# Patient Record
Sex: Male | Born: 1950
Health system: Southern US, Community
[De-identification: ages and names within clinical notes are randomized; demographics above are authoritative.]

## PROBLEM LIST (undated history)

## (undated) DIAGNOSIS — B182 Chronic viral hepatitis C: Secondary | ICD-10-CM

## (undated) DIAGNOSIS — I6529 Occlusion and stenosis of unspecified carotid artery: Secondary | ICD-10-CM

## (undated) HISTORY — DX: Chronic viral hepatitis C: B18.2

## (undated) HISTORY — DX: Occlusion and stenosis of unspecified carotid artery: I65.29

---

## 2002-12-09 ENCOUNTER — Encounter: Payer: Self-pay | Admitting: Emergency Medicine

## 2002-12-09 ENCOUNTER — Emergency Department (HOSPITAL_COMMUNITY): Admission: EM | Admit: 2002-12-09 | Discharge: 2002-12-09 | Payer: Self-pay | Admitting: Emergency Medicine

## 2004-01-12 ENCOUNTER — Emergency Department (HOSPITAL_COMMUNITY): Admission: EM | Admit: 2004-01-12 | Discharge: 2004-01-12 | Payer: Self-pay

## 2006-05-21 ENCOUNTER — Ambulatory Visit (HOSPITAL_COMMUNITY): Admission: RE | Admit: 2006-05-21 | Discharge: 2006-05-21 | Payer: Self-pay | Admitting: Gastroenterology

## 2006-08-19 ENCOUNTER — Encounter: Admission: RE | Admit: 2006-08-19 | Discharge: 2006-08-19 | Payer: Self-pay | Admitting: Occupational Medicine

## 2008-12-13 ENCOUNTER — Ambulatory Visit: Payer: Self-pay | Admitting: Gastroenterology

## 2009-01-09 ENCOUNTER — Encounter (INDEPENDENT_AMBULATORY_CARE_PROVIDER_SITE_OTHER): Payer: Self-pay | Admitting: Interventional Radiology

## 2009-01-09 ENCOUNTER — Ambulatory Visit (HOSPITAL_COMMUNITY): Admission: RE | Admit: 2009-01-09 | Discharge: 2009-01-09 | Payer: Self-pay | Admitting: Gastroenterology

## 2009-01-29 ENCOUNTER — Ambulatory Visit: Payer: Self-pay | Admitting: Gastroenterology

## 2010-01-06 ENCOUNTER — Encounter: Admission: RE | Admit: 2010-01-06 | Discharge: 2010-01-06 | Payer: Self-pay | Admitting: Emergency Medicine

## 2010-04-17 ENCOUNTER — Ambulatory Visit (HOSPITAL_COMMUNITY): Admission: RE | Admit: 2010-04-17 | Discharge: 2010-04-17 | Payer: Self-pay | Admitting: Gastroenterology

## 2010-08-06 ENCOUNTER — Emergency Department (HOSPITAL_COMMUNITY): Admission: EM | Admit: 2010-08-06 | Discharge: 2010-08-06 | Payer: Self-pay | Admitting: Family Medicine

## 2010-10-29 ENCOUNTER — Ambulatory Visit (HOSPITAL_COMMUNITY)
Admission: RE | Admit: 2010-10-29 | Discharge: 2010-10-29 | Payer: Self-pay | Source: Home / Self Care | Attending: Emergency Medicine | Admitting: Emergency Medicine

## 2010-10-29 ENCOUNTER — Ambulatory Visit (HOSPITAL_COMMUNITY): Admission: RE | Admit: 2010-10-29 | Payer: Self-pay | Source: Home / Self Care | Admitting: Emergency Medicine

## 2010-11-23 ENCOUNTER — Encounter: Payer: Self-pay | Admitting: Emergency Medicine

## 2010-11-26 ENCOUNTER — Ambulatory Visit
Admission: RE | Admit: 2010-11-26 | Discharge: 2010-11-26 | Payer: Self-pay | Source: Home / Self Care | Attending: Occupational Medicine | Admitting: Occupational Medicine

## 2011-02-12 LAB — CBC
Platelets: 162 10*3/uL (ref 150–400)
RDW: 13.9 % (ref 11.5–15.5)
WBC: 6.6 10*3/uL (ref 4.0–10.5)

## 2011-02-12 LAB — PROTIME-INR
INR: 1 (ref 0.00–1.49)
Prothrombin Time: 13.4 seconds (ref 11.6–15.2)

## 2011-02-12 LAB — APTT: aPTT: 24 seconds (ref 24–37)

## 2011-10-28 ENCOUNTER — Ambulatory Visit (INDEPENDENT_AMBULATORY_CARE_PROVIDER_SITE_OTHER): Payer: Commercial Managed Care - PPO

## 2011-10-28 DIAGNOSIS — IMO0002 Reserved for concepts with insufficient information to code with codable children: Secondary | ICD-10-CM

## 2011-10-28 DIAGNOSIS — Z202 Contact with and (suspected) exposure to infections with a predominantly sexual mode of transmission: Secondary | ICD-10-CM

## 2011-10-28 DIAGNOSIS — E119 Type 2 diabetes mellitus without complications: Secondary | ICD-10-CM

## 2012-01-14 ENCOUNTER — Ambulatory Visit: Payer: Self-pay | Admitting: Gastroenterology

## 2012-01-21 ENCOUNTER — Telehealth: Payer: Self-pay

## 2012-01-21 NOTE — Telephone Encounter (Signed)
Pt states he will call and try to schedule appt with dr Cleta Alberts.

## 2012-01-21 NOTE — Telephone Encounter (Signed)
DR. Cleta Alberts   Pt wants to know why he is going to a specialist on [facility].  (reassurance)  [phone]

## 2012-01-30 ENCOUNTER — Ambulatory Visit (INDEPENDENT_AMBULATORY_CARE_PROVIDER_SITE_OTHER): Payer: 59 | Admitting: Emergency Medicine

## 2012-01-30 ENCOUNTER — Ambulatory Visit: Payer: Commercial Managed Care - PPO

## 2012-01-30 VITALS — BP 114/68 | HR 72 | Temp 97.7°F | Resp 16 | Ht 70.5 in | Wt 190.2 lb

## 2012-01-30 DIAGNOSIS — R634 Abnormal weight loss: Secondary | ICD-10-CM

## 2012-01-30 DIAGNOSIS — F17200 Nicotine dependence, unspecified, uncomplicated: Secondary | ICD-10-CM

## 2012-01-30 DIAGNOSIS — B1920 Unspecified viral hepatitis C without hepatic coma: Secondary | ICD-10-CM

## 2012-01-30 LAB — COMPREHENSIVE METABOLIC PANEL
ALT: 223 U/L — ABNORMAL HIGH (ref 0–53)
Albumin: 4.3 g/dL (ref 3.5–5.2)
Alkaline Phosphatase: 120 U/L — ABNORMAL HIGH (ref 39–117)
CO2: 27 mEq/L (ref 19–32)
Glucose, Bld: 288 mg/dL — ABNORMAL HIGH (ref 70–99)
Potassium: 4.2 mEq/L (ref 3.5–5.3)
Sodium: 133 mEq/L — ABNORMAL LOW (ref 135–145)
Total Bilirubin: 1.1 mg/dL (ref 0.3–1.2)
Total Protein: 7.7 g/dL (ref 6.0–8.3)

## 2012-01-30 LAB — POCT CBC
Granulocyte percent: 58 %G (ref 37–80)
Hemoglobin: 15.3 g/dL (ref 14.1–18.1)
MCH, POC: 28.2 pg (ref 27–31.2)
MCV: 86.3 fL (ref 80–97)
RBC: 5.43 M/uL (ref 4.69–6.13)
WBC: 8.4 10*3/uL (ref 4.6–10.2)

## 2012-01-30 LAB — PSA: PSA: 0.33 ng/mL (ref <=4.00)

## 2012-01-30 LAB — POCT GLYCOSYLATED HEMOGLOBIN (HGB A1C): Hemoglobin A1C: 10.7

## 2012-01-30 LAB — HIV ANTIBODY (ROUTINE TESTING W REFLEX): HIV: NONREACTIVE

## 2012-01-30 LAB — GLUCOSE, POCT (MANUAL RESULT ENTRY): POC Glucose: 293

## 2012-01-30 NOTE — Progress Notes (Signed)
  Subjective:    Patient ID: Jesse Wilcox, male    DOB: Jan 17, 1951, 61 y.o.   MRN: [id number]  HPI Sukhraj presents for recheck. He has a rather complicated history in that he has hepatitis C and is a heavy smoker. He is had elevated alpha-fetoprotein in the past. He last had a CT abdomen pelvis approximately 1 year 3 months ago and did not show signs of hepatocellular carcinoma. He enters today with weight loss. His sugars have been running between 150 and 200. He is known to have persistent elevated LFTs associated with cirrhosis. Has appointment at the hep C clinic on April 4. His sister also has hep C and is coming up to go to the appointment with him. He continues to smoke a pack a day.    Review of Systems RS relates to the present illness. Many focused on his weight loss     Objective:   Physical Exam  Constitutional: He appears well-developed and well-nourished.  HENT:  Right Ear: External ear normal.  Left Ear: External ear normal.  Neck: No tracheal deviation present. No thyromegaly present.  Cardiovascular: Normal rate and regular rhythm.   Pulmonary/Chest: No respiratory distress. He has no wheezes. He has no rales. He exhibits no tenderness.  Abdominal: He exhibits no distension and no mass. There is no tenderness. There is no rebound and no guarding.    [facility] reading (PRIMARY) by  Dr.Amber Guthridge x-ray shows no acute disease.        Assessment & Plan:   We'll check routine labs. We'll check a chest x-ray. Will repeat alpha-fetoprotein to see if it continues to rise. He has an appointment to have C. clinic.

## 2012-02-01 ENCOUNTER — Encounter: Payer: Self-pay | Admitting: *Deleted

## 2012-02-04 ENCOUNTER — Ambulatory Visit: Payer: Self-pay | Admitting: Gastroenterology

## 2012-02-05 ENCOUNTER — Ambulatory Visit (INDEPENDENT_AMBULATORY_CARE_PROVIDER_SITE_OTHER): Payer: 59 | Admitting: Gastroenterology

## 2012-02-05 DIAGNOSIS — B182 Chronic viral hepatitis C: Secondary | ICD-10-CM

## 2012-02-05 DIAGNOSIS — K7460 Unspecified cirrhosis of liver: Secondary | ICD-10-CM

## 2012-02-08 NOTE — Telephone Encounter (Signed)
Pt states his blood sugar is still high and that Dr Cleta Alberts was going to put him on insulin toward the end of the month pt would like for Dr Cleta Alberts to let him know how much insulin should he be taking he will be going to [facility] [place] and they would be able to show him how to administer it to himself. Please contact pt @ [phone]

## 2012-02-08 NOTE — Telephone Encounter (Signed)
Called pt back and told him Dr Cleta Alberts will plan to see him on Wed evening and to cont his oral meds until then. Also advised pt to drink plenty of water and some exercise will help to keep BS lower until then. Pt agreed.

## 2012-02-08 NOTE — Telephone Encounter (Signed)
Please call patient having come in Wednesday and we will instruct him. on how to use insulin at that time.

## 2012-02-08 NOTE — Telephone Encounter (Signed)
Pt CB and reported his BSs are running in high 200s (285 fasting, 270 later in day a few days ago). Pt is experiencing Sxs, esp of irritability and is afraid he will lose his job if doesn't improve. Agreed w/pt not to wait until end of month for recheck. Pt will plan to RTC to see Dr Cleta Alberts Wed evening and will cont to take his oral DM meds until then. Dr Cleta Alberts, if you want pt to change any meds bf he RTC Wed, please advise and I will call him back.

## 2012-02-08 NOTE — Telephone Encounter (Signed)
LMOM for pt to CB. How high have his BSs been? Get more details on plan to start insulin. Don't see notes about it in OV notes.

## 2012-02-10 ENCOUNTER — Ambulatory Visit (INDEPENDENT_AMBULATORY_CARE_PROVIDER_SITE_OTHER): Payer: 59 | Admitting: Emergency Medicine

## 2012-02-10 VITALS — BP 154/77 | HR 82 | Temp 98.2°F | Resp 16 | Ht 70.5 in | Wt 192.8 lb

## 2012-02-10 DIAGNOSIS — O24919 Unspecified diabetes mellitus in pregnancy, unspecified trimester: Secondary | ICD-10-CM

## 2012-02-10 DIAGNOSIS — E119 Type 2 diabetes mellitus without complications: Secondary | ICD-10-CM | POA: Insufficient documentation

## 2012-02-10 DIAGNOSIS — F329 Major depressive disorder, single episode, unspecified: Secondary | ICD-10-CM

## 2012-02-10 LAB — GLUCOSE, POCT (MANUAL RESULT ENTRY): POC Glucose: 351

## 2012-02-10 NOTE — Patient Instructions (Addendum)
.   Her Glucophage. Please see me in 3 weeks to review your progress with use of Lantus. I will make a referral to a counselor for evaluation to rule out depression prior to starting antiviral therapy for your hepatitis C. we will start with Lantus insulin. We will start at 20 units at bedtime. We will increase her Lantus by 2 units every 48 hours until we see a morning sugar of between 120 and 140

## 2012-02-10 NOTE — Progress Notes (Signed)
  Subjective:    Patient ID: Jesse Wilcox, male    DOB: 03-30-1951, 61 y.o.   MRN: [id number]  HPI patient had a followup. He is known to have hepatitis C. We want to get him off his Glucophage. Apparently he saw the hep C doctor who wanted him to have evaluation for depression prior to starting antiviral therapy.    Review of Systems patient is here to talk about insulin therapy.     Objective:   Physical Exam physical exam repeated blood sugar check is 350        Assessment & Plan:  We'll initiate insulin therapy. We'll start on Lantus 20 units and increase by 2 units every to 3 days to get a morning sugar of 120-140.

## 2012-02-11 ENCOUNTER — Other Ambulatory Visit: Payer: Self-pay | Admitting: Emergency Medicine

## 2012-02-11 NOTE — Progress Notes (Addendum)
NAME:  Jesse Wilcox, Jesse Wilcox  MR#:  [id number]      DATE:  02/05/2012  DOB:  02-Jan-1951    cc: Consulting Physician: Jeani Hawking, MD, [facility], [place], [address], [address], [place], [place] [address], Fax [phone] Primary care physician: Same. Referring Physician: [name], MD, [facility], [address] [place], [place] [address], Fax [phone]    reason for referral: Genotype 1a, hepatitis C.   history: The patient is a 61 year old gentleman who I have been asked to see in consultation by Dr. Cleta Alberts regarding his genotype 1a hepatitis C.   It will be recalled that I first saw the patient on 12/03/2008, regarding his hepatitis C. He underwent a liver biopsy to assess the severity of disease, on 01/09/2009, which showed grade 3 stage II possibly stage III disease. I turned his care over to the nurse practitioner who last saw him on 01/29/2009, and declined to treat him. He was suppose to come back within the year and never followed up. Today, he cannot state whether, in fact, had an appointment to come back or not. There is no documentation in the records as to what occurred and the nurse practitioner has left years ago.   Currently, the patient has no symptoms referable to his history of hepatitis C nor are there symptoms to suggest cryoglobulin mediated or decompensated liver disease. The patient reports that he is concerned about recent weight loss and this may have also prompted the reevaluation of his liver disease. Looking through the records I received from [facility], his weight on 09/18/2011, was 204 pounds and today it is 193 pounds indicating an 11 pound weight loss over the last 5-1/2 months. Again, there are no symptoms of decompensated liver disease such as ascites or peripheral edema. He has no ongoing fevers. He has never had a variceal bleed nor has he been screened for varices that I can ascertain from looking  through his records. There are no symptoms of encephalopathy. In terms of his hepatocellular cancer [facility]) screening, he underwent a CT scan of the abdomen on 10/29/2010. This was done because he apparently became short of breath with gadolinium in the past. This showed a cirrhotic liver only. He has not had any liver imaging since. A chest x-ray was done 01/30/2012, because of the weight loss and his history of smoking, which was unremarkable. It should also be noted that he underwent presumably a screening colonoscopy by Dr. Jeani Hawking, on 05/21/2006. The results are not available in EPIC. The patient believes this was unremarkable.   With respect to risk factors for liver disease, he denies any alcohol use now over 15 years. He similarly denies any drug use over 15 years. He denies any history of blood transfusions. 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 He previously denied a family history of liver disease. Today, he had me talk to his sister, on the phone, who was previously treated for hepatitis C. He believes he was immunized against hepatitis B through his work, and thinks that Dr. Nickolas Madrid has vaccinated him against hepatitis A, as he was previously nave when last seen by me.   PAST MEDICAL HISTORY: Significant for hypertension. 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 He recalled in the past he was treated for  this. He has a history of type 2 diabetes. He recalls his last Hemoglobin A1c was 10% and a 3 day average on the monitor he brought with him today, was 218. He reports that when seen on 01/30/2012, by Dr. [name], there was discussion of starting him on insulin. He does not check his blood sugars daily. He only does this when he feels unwell. He denies any history of coronary artery disease or dyslipidemia.   PAST  SURGICAL HISTORY: Denies.  past psychiatric history: Denies.  current medications: Metformin 500 mg b.i.d., benazepril/hydrochlorothiazide 20/12.5 mg daily, amlodipine 10 mg daily, cyclobenzaprine 0.50 mg daily, aspirin 81 mg daily.  Allergies: denies.  habits: Smoking, 1 pack of cigarettes per day. Alcohol, as above.   family history: As above.   social history: Single. He has grown children. 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   He reported he wanted to bring his sister, who lives in [place], to his appointment today, but she had car problems preventing her from driving up today.  REVIEW OF SYSTEMS:  All 10 systems were reviewed today on the review of systems form, which was signed and placed in the chart. CES-D was 22.   PHYSICAL EXAMINATION:  Constitutional:  Stated age without stigmata of liver disease. No peripheral wasting.  Vital signs: Height 70 inches, weight 193 pounds. Blood pressure 148/94, pulse 79, temperature 97.6 Fahrenheit.  Ears, Nose, Mouth and Throat:  Unremarkable oropharynx.  No thyromegaly or neck masses.  Chest:  Resonant to percussion.  Clear to auscultation.  Cardiovascular:  Heart sounds normal S1, S2 without murmurs or rubs.  There is no peripheral edema.  Abdomen:  Normal bowel sounds.  No masses or tenderness.  I could not appreciate a liver edge or spleen tip.  I could not appreciate any hernias.  Lymphatics:  No cervical or inguinal lymphadenopathy.  Central Nervous System:  No asterixis or focal neurologic findings.  Dermatologic:  Anicteric without palmar erythema or spider angiomata.  Eyes:  Anicteric sclerae.  Pupils are equal and reactive to light.  laboratories: The patient reported that labs were done on 01/30/2012, but I did not receive these. The last labs I have were from 04/12/2011, at which time his AST was 123, ALT 194, ALP 101, total bilirubin 1.0,  albumin 4.0, globulins 2.8, creatinine 0.85. Of note, his RPR on 04/12/2011, was negative. AFP was 22.8, which was down from 32.9 in December 2011 and his HIV was negative.   assessment: The patient is a 61 year old gentleman with a history of genotype 1a hepatitis C with a previous biopsy on 01/09/2009, showing grad 3, stage II-III, but most likely stage III fibrosis with more recently radiographic imaging on 10/29/2010, suggesting cirrhosis. He is nave to treatment.   In terms of cirrhosis care, there is no indication of ascites or edema to treat. There are no symptoms of encephalopathy to treat. There is no indication of a need for endoscopy to screen for varices. He reports previous hepatitis B vaccination through work and hepatitis A vaccination through his primary physician. In terms of hepatocellular cancer screening, he is overdue having last undergone screening on 10/29/2010.   In terms of his candidacy for treatment of his hepatitis C, with a Hemoglobin A1c of 10% recently and his blood sugars as shown on his monitor today, he is a poor candidate for treatment of his hepatitis C. In fact, through my conversation  with him, it appears the patient has little understanding of the complexity of hepatitis C management. It would appear the he would be best helped by his sister who could not attend his appointment today. I would need to see improvement in his blood sugar control and I would like to have his family members with him at his appointment before I could discuss starting him on therapy, which would likely involve pegylated interferon and ribavirin and a protease inhibitor. The complexity and side effects of treatment can be very significant and it may be even best to wait for simeprevir before starting him on therapy. By the time his diabetes gets under better control, simeprevir may be available anyway.  In terms of his weight loss, he needs to be screened for hepatocellular cancer. In  addition, cirrhosis can cause weight loss in some patients, particularly peripheral muscle loss. This would have to be matched with significant decline in synthetic function by lab testing. If neither the CT scan that I am going to order, shows any significant disease, nor do his lab tests show any significant synthetic dysfunction, than his weight loss is not attributable to his liver disease.   In my discussion today with the patient, and then in repetition with his sister over the phone, I discussed his previous genotyping and biopsy results. I discussed the significance of them. I discussed screening him for hepatocellular cancer with a CT scan, because he did not tolerate the gadolinium in the past. We discussed obtaining lab work today. He mentioned that his labs were done recently, but I have not received any of these. We discussed treatment with pegylated interferon and ribavirin and a protease inhibitor, as well. I have explained to him that with his poorly controlled diabetes, he is not a candidate for this. I also explained that he would probably need stress testing before commencement on therapy.   plan:  1. CT scan with contrast ordered. 2. I have ordered AFP, CBC, CMP, INR, TSH, and ANA.  3. If Dr. Cleta Alberts has any of the recent labs, I would appreciate receiving these by fax at [phone]. 4. No indication for endoscopy unless platelet count is 100,000.  5. No therapy until Hemoglobin A1c improves to perhaps less than 7%. 6. I would ask that, as the diabetes gets under better control, that Dr. Cleta Alberts arrange for stress testing in anticipation of hepatitis C treatment induced anemia that could provoke any underlying ischemic heart disease that may not be known about, but he is at risk for because of his diabetes and smoking history.  7. He reports that he has received hepatitis A and B vaccinations in the past.          8. He is to return in approximately 2-4 month's time in follow up  to review the lab testing and his progress in terms of bringing his diabetes under control and to bring his sister back to discuss treatment again.               Brooke Dare, MD   ADDENDUM: None of the labs that I ordered were done.  I understand that he did not want to have them done because Dr. Cleta Alberts had just done labs.  I would ask, therefore, that any labs from the last month be faxed to [phone].  As I am signing this, I note his 02/10/12 clinic note from Dr. Cleta Alberts, in which it is stated that the patient said I wanted him evaluated  for depression prior to therapy.  This is clearly not the case and speaks to Mr Ebers's lack of understanding of his hepatitis C and contributes to my doubts about his candidacy for treatment.   ADDENDUM  02/17/12 Labs from 01/30/12 from Dr Cleta Alberts received an placed in the paper chart.   [phone]  D:  Fri Apr 05 20:10:56 2013 ; T:  Sun Apr 07 10:02:19 2013  Job #:  [id number]

## 2012-02-12 ENCOUNTER — Other Ambulatory Visit: Payer: Self-pay | Admitting: Emergency Medicine

## 2012-02-12 ENCOUNTER — Telehealth: Payer: Self-pay | Admitting: Emergency Medicine

## 2012-02-12 DIAGNOSIS — B182 Chronic viral hepatitis C: Secondary | ICD-10-CM

## 2012-02-12 NOTE — Telephone Encounter (Signed)
Please call patient. I received the notes from Dr. Jacqualine Mau his hep C Dr. He wants him referred to cardiology and not to a psychologist. I have made the referral to the cardiology office and we'll cancel his appointment with the psychologist.

## 2012-02-15 NOTE — Telephone Encounter (Signed)
LMOM of Dr Ellis Parents message and that we will be making the cardiologist referral for him.

## 2012-03-01 ENCOUNTER — Ambulatory Visit (INDEPENDENT_AMBULATORY_CARE_PROVIDER_SITE_OTHER): Payer: 59 | Admitting: Emergency Medicine

## 2012-03-01 VITALS — BP 116/77 | HR 86 | Temp 98.0°F | Resp 16 | Ht 71.0 in | Wt 194.6 lb

## 2012-03-01 DIAGNOSIS — B1920 Unspecified viral hepatitis C without hepatic coma: Secondary | ICD-10-CM

## 2012-03-01 DIAGNOSIS — E119 Type 2 diabetes mellitus without complications: Secondary | ICD-10-CM

## 2012-03-01 MED ORDER — INSULIN GLARGINE 100 UNIT/ML ~~LOC~~ SOLN: 34.0000 [IU] | Freq: Every day | SUBCUTANEOUS | Status: DC

## 2012-03-01 NOTE — Progress Notes (Signed)
  Subjective:    Patient ID: Jesse Wilcox, male    DOB: 07-Apr-1951, 61 y.o.   MRN: [id number]  HPI Patient presents for followup of his diabetes. He was switched off his oral medicines on Lantus currently using 34 units a day. Normal running between the 100 in the 120. He denies any low spells. He is totally off his metformin and Glucotrol. He has not heard about his appointment with cardiologist. Seen Dr. Eliseo Squires at the hep C clinic and they are awaiting clearance from the cardiologist prior to him starting therapy for his hep C   Review of Systems     Objective:   Physical Exam HEENT exam unremarkable chest was clear heart regular rate no murmurs.   Results for orders placed in visit on 03/01/12  GLUCOSE, POCT (MANUAL RESULT ENTRY)      Component Value Range   POC Glucose 112         Assessment & Plan:      Patient is doing great. I went ahead and made the appointment with the cardiologist while the patient was here so there would be no misunderstanding of his appointment time. Once this is done he is cleared to start his therapy for hep C

## 2012-03-11 ENCOUNTER — Other Ambulatory Visit: Payer: Self-pay | Admitting: Family Medicine

## 2012-03-11 MED ORDER — ALCOHOL PREPS PADS: 1.0000 | MEDICATED_PAD | Freq: Three times a day (TID) | Status: DC | PRN

## 2012-03-11 MED ORDER — GNP SUPER THIN LANCETS 30G MISC: 30.0000 g | Freq: Three times a day (TID) | Status: DC | PRN

## 2012-04-07 ENCOUNTER — Ambulatory Visit: Payer: 59 | Admitting: Gastroenterology

## 2012-04-12 ENCOUNTER — Encounter (HOSPITAL_COMMUNITY): Payer: Self-pay | Admitting: Pharmacy Technician

## 2012-04-14 ENCOUNTER — Ambulatory Visit (INDEPENDENT_AMBULATORY_CARE_PROVIDER_SITE_OTHER): Payer: 59 | Admitting: Emergency Medicine

## 2012-04-14 VITALS — BP 131/75 | HR 66 | Temp 97.4°F | Resp 16 | Ht 71.0 in | Wt 197.0 lb

## 2012-04-14 DIAGNOSIS — E119 Type 2 diabetes mellitus without complications: Secondary | ICD-10-CM

## 2012-04-14 DIAGNOSIS — B182 Chronic viral hepatitis C: Secondary | ICD-10-CM

## 2012-04-14 DIAGNOSIS — K739 Chronic hepatitis, unspecified: Secondary | ICD-10-CM

## 2012-04-14 DIAGNOSIS — I6529 Occlusion and stenosis of unspecified carotid artery: Secondary | ICD-10-CM

## 2012-04-14 NOTE — Progress Notes (Signed)
  Subjective:    Patient ID: Jesse Wilcox, male    DOB: [birth year], 61 y.o.   MRN: [id number]  HPI patient is to followup on his diabetes. His sugars have been running in the 100 to 140 range and he feels much better on his insulin injections. He is seeing Dr. Jacinto Halim she had had ultrasound which was suspicious for carotid stenosis and is scheduled for an arteriogram His hep C treatment has been placed on hold until his carotid studies are done to   Review of Systems     Objective:   Physical Exam I could not hear any carotid bruits on exam. His cardiac exam is unremarkable. His glucose checked at home this morning was 1 to        Assessment & Plan:  Discussed the need to proceed with carotid studies to rule out stenosis

## 2012-04-15 ENCOUNTER — Encounter (HOSPITAL_COMMUNITY)
Admission: RE | Disposition: A | Discharge status: Home / Self Care | Payer: Self-pay | Source: Ambulatory Visit | Attending: Cardiology

## 2012-04-15 ENCOUNTER — Ambulatory Visit (HOSPITAL_COMMUNITY)
Admission: RE | Admit: 2012-04-15 | Discharge: 2012-04-15 | Disposition: A | Discharge status: Home / Self Care | Payer: 59 | Source: Ambulatory Visit | Attending: Cardiology | Admitting: Cardiology

## 2012-04-15 DIAGNOSIS — R0989 Other specified symptoms and signs involving the circulatory and respiratory systems: Secondary | ICD-10-CM | POA: Insufficient documentation

## 2012-04-15 DIAGNOSIS — I6529 Occlusion and stenosis of unspecified carotid artery: Secondary | ICD-10-CM | POA: Diagnosis present

## 2012-04-15 DIAGNOSIS — I708 Atherosclerosis of other arteries: Secondary | ICD-10-CM | POA: Insufficient documentation

## 2012-04-15 DIAGNOSIS — F17200 Nicotine dependence, unspecified, uncomplicated: Secondary | ICD-10-CM | POA: Insufficient documentation

## 2012-04-15 DIAGNOSIS — E119 Type 2 diabetes mellitus without complications: Secondary | ICD-10-CM | POA: Insufficient documentation

## 2012-04-15 DIAGNOSIS — B1920 Unspecified viral hepatitis C without hepatic coma: Secondary | ICD-10-CM | POA: Insufficient documentation

## 2012-04-15 HISTORY — PX: CAROTID ANGIOGRAM: SHX5504

## 2012-04-15 HISTORY — PX: ARCH AORTOGRAM: SHX5501

## 2012-04-15 HISTORY — PX: OTHER SURGICAL HISTORY: SHX169

## 2012-04-15 LAB — POCT I-STAT, CHEM 8
Chloride: 108 mEq/L (ref 96–112)
HCT: 48 % (ref 39.0–52.0)
Potassium: 3.9 mEq/L (ref 3.5–5.1)

## 2012-04-15 LAB — GLUCOSE, CAPILLARY: Glucose-Capillary: 105 mg/dL — ABNORMAL HIGH (ref 70–99)

## 2012-04-15 SURGERY — CAROTID ANGIOGRAM
Anesthesia: LOCAL

## 2012-04-15 MED ORDER — SODIUM CHLORIDE 0.9 % IV SOLN
INTRAVENOUS | Status: DC
  Administered 2012-04-15: 06:00:00 via INTRAVENOUS

## 2012-04-15 MED ORDER — LIDOCAINE HCL (PF) 1 % IJ SOLN
INTRAMUSCULAR | Status: AC
  Filled 2012-04-15: qty 30

## 2012-04-15 MED ORDER — FAMOTIDINE IN NACL 20-0.9 MG/50ML-% IV SOLN: 40.0000 mg | Freq: Once | INTRAVENOUS | Status: DC

## 2012-04-15 MED ORDER — HEPARIN (PORCINE) IN NACL 2-0.9 UNIT/ML-% IJ SOLN
INTRAMUSCULAR | Status: AC
  Filled 2012-04-15: qty 1000

## 2012-04-15 MED ORDER — SODIUM CHLORIDE 0.9 % IV SOLN: 1.0000 mL/kg/h | INTRAVENOUS | Status: DC

## 2012-04-15 MED ORDER — FAMOTIDINE IN NACL 20-0.9 MG/50ML-% IV SOLN
INTRAVENOUS | Status: AC
  Administered 2012-04-15: 40 mg via INTRAVENOUS
  Filled 2012-04-15: qty 50

## 2012-04-15 NOTE — Progress Notes (Signed)
Pt walked in hallway without difficulty. Right groin dressing remains CDI.

## 2012-04-15 NOTE — Interval H&P Note (Signed)
History and Physical Interval Note:  04/15/2012 7:27 AM  Jesse Wilcox  has presented today for surgery, with the diagnosis of Stenosis  The various methods of treatment have been discussed with the patient and family. After consideration of risks, benefits and other options for treatment, the patient has consented to  Procedure(s) (LRB): CAROTID ANGIOGRAM (N/A) as a surgical intervention .  The patients' history has been reviewed, patient examined, no change in status, stable for surgery.  I have reviewed the patients' chart and labs.  Questions were answered to the patient's satisfaction.     Pamella Pert

## 2012-04-15 NOTE — H&P (Signed)
  Please see paper chart  

## 2012-04-15 NOTE — CV Procedure (Signed)
Procedure performed: Aortic arch angiogram Selective right and left carotid angiogram both extracranial and intracranial  Indication:  Mr. Jesse Wilcox is a 60 year old Caucasian male with history of chronic hepatitis C, diabetes mellitus, history tobacco use disorder who on physical examination had very prominent carotid bruit. Outpatient Doppler evaluation had revealed suspicion for a high-grade stenosis of the left internal carotid. He is brought to the peripheral angiography suite to evaluate and confirm high-grade stenoses.  Right femoral arteriogram and closure of right femoral arterial access with Perclose.   Aortic arch angiogram: Type I Arch with normal origin of the extracranial vasculature. The subclavian arteries on both sides and the vertebral arteries on both sides were widely patent in the proximal segment. There was a suspicion for high-grade stenosis of the left internal carotid artery.  Selective right carotid arteriogram: There was mild disease evident in the right internal carotid artery but there was no high-grade stenoses.  Intracranial vessels: Grossly normal without evidence of any aneurysmal dilatation or occlusion. The intracerebral portion of the carotid angiogram will be read by radiology.  Left carotid arteriogram: There is mild disease in the left common carotid. Immediately after the origin of the left internal carotid artery there was a high-grade 90% stenoses.  Intracranial vessels from left internal carotid artery: They're grossly normal without any evidence of aneurysmal dilatation or occlusion or stenoses. The intracerebral portion will be read by radiology.  Right external iliac artery and right common femoral artery: There was a focal 50-60% stenosis of the right external iliac artery. This was not fully analyzed. The angiogram was performed as a part of trying to close the arterial access with Perclose. If claudication is present clinically, further  evaluation may be indicated.  Technical procedure: Under sterile precautions using a 5 French right femoral arterial access a 5 French pigtail catheter was advanced into the arch of the aorta over a Versacore wire. Arch aortogram was performed in the LAO projection. The catheter then poorer body over the same wire. A 5 [place] JB 1 catheter was then advanced into the arch of the aorta over the same wire and selective right and left carotid arteries were engaged and angiography were performed in the standard fashion and standard views. The cath was then pulled out of the body over the same wire. Right femoral arteriogram was performed through the arterial access sheath and the access was attempted to close with Perclose, however do to failure of Perclose manual pressure was held for 10 minutes. Patient be discharged home today after 4 hours of bedrest. Patient can return to work on Monday. I have discussed the angiogram with Dr. Fabienne Bruns and he'll followup with him for elective carotid endarterectomy for a high-grade stenoses in asymptomatic individuals.

## 2012-04-15 NOTE — Discharge Instructions (Signed)
Patient can return to work on 04/18/2012 without any limitations. No driving today and tomorrow and essentially had to remain in bed most day-to-day.    Groin Site Care Refer to this sheet in the next few weeks. These instructions provide you with information on caring for yourself after your procedure. Your caregiver may also give you more specific instructions. Your treatment has been planned according to current medical practices, but problems sometimes occur. Call your caregiver if you have any problems or questions after your procedure. HOME CARE INSTRUCTIONS  You may shower 24 hours after the procedure. Remove the bandage (dressing) and gently wash the site with plain soap and water. Gently pat the site dry.   Do not apply powder or lotion to the site.   Do not sit in a bathtub, swimming pool, or whirlpool for 5 to 7 days.   No bending, squatting, or lifting anything over 10 pounds (4.5 kg) as directed by your caregiver.   Inspect the site at least twice daily.   Do not drive home if you are discharged the same day of the procedure. Have someone else drive you.   You may drive 24 hours after the procedure unless otherwise instructed by your caregiver.  What to expect:  Any bruising will usually fade within 1 to 2 weeks.   Blood that collects in the tissue (hematoma) may be painful to the touch. It should usually decrease in size and tenderness within 1 to 2 weeks.  SEEK IMMEDIATE MEDICAL CARE IF:  You have unusual pain at the groin site or down the affected leg.   You have redness, warmth, swelling, or pain at the groin site.   You have drainage (other than a small amount of blood on the dressing).   You have chills.   You have a fever or persistent symptoms for more than 72 hours.   You have a fever and your symptoms suddenly get worse.   Your leg becomes pale, cool, tingly, or numb.   You have heavy bleeding from the site. Hold pressure on the site.  Document  Released: 11/21/2010 Document Revised: 10/08/2011 Document Reviewed: 11/21/2010 [facility] Patient Information 2012 [employer], [place].

## 2012-04-18 ENCOUNTER — Telehealth: Payer: Self-pay | Admitting: Vascular Surgery

## 2012-04-18 ENCOUNTER — Encounter: Payer: Self-pay | Admitting: Vascular Surgery

## 2012-04-18 NOTE — Telephone Encounter (Signed)
Staff message on MRN [id number] also had instructions for Mr Weekley:  "Also please schedule appt for Patsy Lager for carotid eval, had angio by Dr Jacinto Halim today. Does not need duplex, just office visit to consider CEA vs stent next Thursday." Fabienne Bruns Friday April 15, 2012 1:54pm  Left message for patient to call and confirm appt. dpm

## 2012-04-21 ENCOUNTER — Encounter: Payer: 59 | Admitting: Vascular Surgery

## 2012-04-27 ENCOUNTER — Encounter: Payer: Self-pay | Admitting: Vascular Surgery

## 2012-04-28 ENCOUNTER — Encounter: Payer: Self-pay | Admitting: Vascular Surgery

## 2012-04-28 ENCOUNTER — Ambulatory Visit (INDEPENDENT_AMBULATORY_CARE_PROVIDER_SITE_OTHER): Payer: 59 | Admitting: Vascular Surgery

## 2012-04-28 VITALS — BP 137/84 | HR 78 | Temp 97.7°F | Ht 71.0 in | Wt 201.0 lb

## 2012-04-28 DIAGNOSIS — I6529 Occlusion and stenosis of unspecified carotid artery: Secondary | ICD-10-CM

## 2012-04-28 NOTE — Progress Notes (Signed)
History of Present Illness:  Patient is a 61 y.o. year old male who presents for evaluation of carotid stenosis.  He was found by his primary care physician to have an asymptomatic left carotid bruit The patient denies symptoms of TIA, amaurosis, or stroke.  The patient is currently on aspirin antiplatelet therapy.  He is referred by Dr. Yates Decamp after a recent carotid angiogram Other medical problems include hepatitis C, diabetes, hypertension, tobacco abuse.  These are currently stable and followed by Dr. Cleta Alberts. He currently smokes half-pack cigarettes per day.    Past Medical History  Diagnosis Date  . Chronic hepatitis C   . Diabetes mellitus   . Carotid artery occlusion     Past Surgical History  Procedure Date  . Aortic arch angiogram 04-15-12    By Dr. Jacinto Halim     Social History History  Substance Use Topics  . Smoking status: Current Everyday Smoker -- 0.5 packs/day for 42 years    Types: Cigarettes  . Smokeless tobacco: Never Used  . Alcohol Use: No   He works at [employer]  Family History Family History  Problem Relation Age of Onset  . Diabetes Mother   . Heart disease Mother   . Hypertension Mother   . Heart attack Mother   . Diabetes Father   . Heart disease Father   . Hypertension Father   . Diabetes Sister   . Hyperlipidemia Sister   . Heart disease Sister   . Heart attack Sister   . Peripheral vascular disease Sister   . Hyperlipidemia Brother   . Hypertension Brother     Allergies  Allergies  Allergen Reactions  . Iohexol      Code: SOB, Desc: Immed post contrast inj of Multihance, pt had difficulty breathing after several minutes pt felt better., Onset Date: [id number]      Current Outpatient Prescriptions  Medication Sig Dispense Refill  . amLODipine (NORVASC) 10 MG tablet Take 10 mg by mouth daily.      Marland Kitchen aspirin EC 81 MG tablet Take 81 mg by mouth daily.      . benazepril-hydrochlorthiazide (LOTENSIN HCT) 20-12.5 MG per tablet Take 1  tablet by mouth daily.      . cyclobenzaprine (FLEXERIL) 5 MG tablet Take 2.5 mg by mouth at bedtime.       . insulin glargine (LANTUS) 100 UNIT/ML injection Inject 34 Units into the skin at bedtime.        ROS:   General:  No weight loss, Fever, chills  HEENT: No recent headaches, no nasal bleeding, no visual changes, no sore throat  Neurologic: No dizziness, blackouts, seizures. No recent symptoms of stroke or mini- stroke. No recent episodes of slurred speech, or temporary blindness.  Cardiac: No recent episodes of chest pain/pressure, no shortness of breath at rest.  No shortness of breath with exertion.  Denies history of atrial fibrillation or irregular heartbeat  Vascular: No history of rest pain in feet.  No history of claudication.  No history of non-healing ulcer, No history of DVT   Pulmonary: No home oxygen, no productive cough, no hemoptysis,  No asthma or wheezing  Musculoskeletal:  [ ]  Arthritis, [ ]  Low back pain,  [ ]  Joint pain  Hematologic:No history of hypercoagulable state.  No history of easy bleeding.  No history of anemia  Gastrointestinal: No hematochezia or melena,  No gastroesophageal reflux, no trouble swallowing  Urinary: [ ]  chronic Kidney disease, [ ]  on HD - [ ]   MWF or [ ]  TTHS, [ ]  Burning with urination, [ ]  Frequent urination, [ ]  Difficulty urinating;   Skin: No rashes  Psychological: No history of anxiety,  No history of depression   Physical Examination  Filed Vitals:   04/28/12 0841 04/28/12 0849  BP: 142/79 137/84  Pulse: 84 78  Temp: 97.7 F (36.5 C)   TempSrc: Oral   Height: 5\' 11"  (1.803 m)   Weight: 201 lb (91.173 kg)   SpO2: 100%     Body mass index is 28.03 kg/(m^2).  General:  Alert and oriented, no acute distress HEENT: Normal Neck: Soft left bruit  no  JVD Pulmonary: Clear to auscultation bilaterally Cardiac: Regular Rate and Rhythm without murmur Gastrointestinal: Soft, non-tender, non-distended, no mass Skin: No  rash Extremity Pulses:  2+ radial, brachial, femoral pulses bilaterally Musculoskeletal: No deformity or edema  Neurologic: Upper and lower extremity motor 5/5 and symmetric  DATA: I reviewed the recent carotid angiogram by Dr. Newt Lukes. This shows a 80% stenosis of the left internal carotid artery. There is a 50% shelflike plaque in the right internal carotid artery. It is a type I arch. The carotid bifurcation is fairly high and the distal end of the plaque extends underneath the angle of the mandible   ASSESSMENT:   I had a lengthy discussion with the patient and his sister today. I discussed the risks and benefits of medical management versus carotid endarterectomy versus carotid stenting. I told the patient that a carotid endarterectomy could be performed but there would be some risk of swallowing difficulties afterwards due to the high extent of the lesion. However I did inform him that the risk of stroke was lower than with carotid stenting. On the other hand I discussed with him that the risk of carotid stenting is well overall and the risk of cranial nerve injury would be less. He has opted for carotid stenting at this point.  PLAN:  Left carotid stent, our office will call him later today with his operative date.  He was given a prescription for Plavix today. We will maintain him on Plavix and aspirin. I discussed with him that we will leave him on Plavix for at least 6 weeks after the procedure. He may continue indefinitely.   Fabienne Bruns, MD [facility] [place] Office: [phone] Pager: [phone]

## 2012-05-04 ENCOUNTER — Ambulatory Visit: Payer: 59

## 2012-05-04 ENCOUNTER — Ambulatory Visit (INDEPENDENT_AMBULATORY_CARE_PROVIDER_SITE_OTHER): Payer: 59 | Admitting: Emergency Medicine

## 2012-05-04 ENCOUNTER — Other Ambulatory Visit: Payer: Self-pay

## 2012-05-04 VITALS — BP 144/82 | HR 75 | Temp 98.1°F | Resp 16 | Ht 70.5 in | Wt 200.0 lb

## 2012-05-04 DIAGNOSIS — M79673 Pain in unspecified foot: Secondary | ICD-10-CM

## 2012-05-04 DIAGNOSIS — M79609 Pain in unspecified limb: Secondary | ICD-10-CM

## 2012-05-04 NOTE — Patient Instructions (Addendum)
Metatarsal Stress Fracture  When too much stress is put on the foot, as in running and jumping sports, the center shaft of the bones of the forefoot is very susceptible to stress fractures (break in bone). This is because of repetitive stress on the bone. This injury is more common if osteoporosis is present or if inadequate running shoes are used. Rapid increase in running distances are often the cause. Running distances should be gradually increased to avoid this problem. Shoes should be used which adequately cushion the foot. Shoes should absorb the shocks of the activity.    DIAGNOSIS    Usually the diagnosis is made by history. The foot progressively becomes sorer with activities. X-rays may be negative (show no break) within the first 2 to 3 weeks of the beginning of pain. A later X-ray may show signs of healing bone (callus formation). A bone scan or MRI will usually make the diagnosis earlier.  TREATMENT AND HOME CARE INSTRUCTIONS   Treatment may or may not include a cast, removable fracture boot, or walking shoe. Casts are used for short periods of time to prevent muscle atrophy (muscle wasting).    Activities should be stopped until further advised by your caregiver.    Wear shoes with adequate shock absorbing abilities.    Alternative exercise may be undertaken while waiting for healing. These may include bicycling and swimming, or as your caregiver suggests.   If you do not have a cast or splint:   You may walk on your injured foot as tolerated or advised.    Do not put any weight on your injured foot for as long as directed by your caregiver. Slowly increase the amount of time you walk on the foot as the pain allows or as advised.    Use crutches until you can bear weight without pain. A gradual increase in weight bearing may help.    Apply ice to the injury for 15 to 20 minutes each hour while awake for the first 2 days. Put the ice in a plastic bag and place a towel between the bag of ice and  your skin.    Only take over-the-counter or prescription medicines for pain, discomfort, or fever as directed by your caregiver.   SEEK IMMEDIATE MEDICAL CARE IF:     Pain is becoming worse rather than better, or if pain is uncontrolled with medications.    You have increased swelling or redness in the foot.   MAKE SURE YOU:     Understand these instructions.    Will watch your condition.    Will get help right away if you are not doing well or get worse.   Document Released: 10/16/2000 Document Revised: 10/08/2011 Document Reviewed: 08/14/2008  [employer] Patient Information 2012 [employer].

## 2012-05-04 NOTE — Progress Notes (Signed)
  Subjective:    Patient ID: Jesse Wilcox, male    DOB: [birth year], 61 y.o.   MRN: [id number]  HPI patient enters with a one-day history of severe pain and discomfort in his right foot he has severe pain at the end of his third and fourth metatarsals of his right foot. Not had any numbness or weakness    Review of Systems     Objective:   Physical Exam there is exquisite tenderness over the distal third and fourth metatarsals at the joint space. There is some swelling in this area   Results for orders placed in visit on 05/04/12  GLUCOSE, POCT (MANUAL RESULT ENTRY)      Component Value Range   POC Glucose 135 (*) 70 - 99 mg/dl  [facility] reading (PRIMARY) by  Dr.Allysha Tryon is a radiolucent line in the distant fourth metatarsal no definite abnormality       Assessment & Plan:  I suspect the patient has a stress fracture. Beginning comfortable shoes versus a postop shoe. Troller 135. Her upcoming left carotid stenting July 26 Dr. Darrick Penna.

## 2012-05-17 ENCOUNTER — Other Ambulatory Visit: Payer: Self-pay | Admitting: Family Medicine

## 2012-05-17 MED ORDER — INSULIN PEN NEEDLE 31G X 8 MM MISC: Status: DC

## 2012-05-27 ENCOUNTER — Ambulatory Visit (HOSPITAL_COMMUNITY): Admission: RE | Admit: 2012-05-27 | Payer: 59 | Source: Ambulatory Visit | Admitting: Vascular Surgery

## 2012-05-27 ENCOUNTER — Encounter (HOSPITAL_COMMUNITY): Admission: RE | Payer: Self-pay | Source: Ambulatory Visit

## 2012-05-27 SURGERY — CAROTID STENT INSERTION
Anesthesia: LOCAL

## 2012-06-29 ENCOUNTER — Encounter: Payer: Self-pay | Admitting: Vascular Surgery

## 2012-06-30 ENCOUNTER — Encounter: Payer: Self-pay | Admitting: Vascular Surgery

## 2012-06-30 ENCOUNTER — Ambulatory Visit: Payer: 59 | Admitting: Vascular Surgery

## 2012-06-30 VITALS — BP 153/86 | HR 72 | Resp 16 | Ht 70.5 in | Wt 201.0 lb

## 2012-06-30 DIAGNOSIS — I6529 Occlusion and stenosis of unspecified carotid artery: Secondary | ICD-10-CM

## 2012-07-21 ENCOUNTER — Ambulatory Visit (INDEPENDENT_AMBULATORY_CARE_PROVIDER_SITE_OTHER): Payer: 59 | Admitting: Emergency Medicine

## 2012-07-21 VITALS — BP 140/80 | HR 71 | Temp 98.1°F | Resp 16 | Ht 71.0 in | Wt 200.6 lb

## 2012-07-21 DIAGNOSIS — N481 Balanitis: Secondary | ICD-10-CM

## 2012-07-21 DIAGNOSIS — N476 Balanoposthitis: Secondary | ICD-10-CM

## 2012-07-21 DIAGNOSIS — Z23 Encounter for immunization: Secondary | ICD-10-CM

## 2012-07-21 DIAGNOSIS — M62838 Other muscle spasm: Secondary | ICD-10-CM

## 2012-07-21 DIAGNOSIS — E119 Type 2 diabetes mellitus without complications: Secondary | ICD-10-CM

## 2012-07-21 DIAGNOSIS — I10 Essential (primary) hypertension: Secondary | ICD-10-CM

## 2012-07-21 DIAGNOSIS — E139 Other specified diabetes mellitus without complications: Secondary | ICD-10-CM

## 2012-07-21 DIAGNOSIS — B029 Zoster without complications: Secondary | ICD-10-CM

## 2012-07-21 LAB — POCT CBC
Granulocyte percent: 56 %G (ref 37–80)
HCT, POC: 51.3 % (ref 43.5–53.7)
Hemoglobin: 16 g/dL (ref 14.1–18.1)
Lymph, poc: 2.4 (ref 0.6–3.4)
MCHC: 31.2 g/dL — AB (ref 31.8–35.4)
MPV: 11 fL (ref 0–99.8)
POC Granulocyte: 3.7 (ref 2–6.9)
POC MID %: 7.4 %M (ref 0–12)
RBC: 5.67 M/uL (ref 4.69–6.13)

## 2012-07-21 LAB — GLUCOSE, POCT (MANUAL RESULT ENTRY): POC Glucose: 117 mg/dl — AB (ref 70–99)

## 2012-07-21 LAB — COMPREHENSIVE METABOLIC PANEL
ALT: 201 U/L — ABNORMAL HIGH (ref 0–53)
AST: 143 U/L — ABNORMAL HIGH (ref 0–37)
Albumin: 4 g/dL (ref 3.5–5.2)
Alkaline Phosphatase: 118 U/L — ABNORMAL HIGH (ref 39–117)
BUN: 13 mg/dL (ref 6–23)
Calcium: 9.2 mg/dL (ref 8.4–10.5)
Chloride: 102 mEq/L (ref 96–112)
Potassium: 4 mEq/L (ref 3.5–5.3)
Sodium: 135 mEq/L (ref 135–145)
Total Protein: 7.8 g/dL (ref 6.0–8.3)

## 2012-07-21 LAB — POCT GLYCOSYLATED HEMOGLOBIN (HGB A1C): Hemoglobin A1C: 7.6

## 2012-07-21 MED ORDER — AMLODIPINE BESYLATE 10 MG PO TABS: 10.0000 mg | ORAL_TABLET | Freq: Every day | ORAL | Status: DC

## 2012-07-21 MED ORDER — CYCLOBENZAPRINE HCL 5 MG PO TABS: 2.5000 mg | ORAL_TABLET | Freq: Every day | ORAL | Status: DC

## 2012-07-21 MED ORDER — VALACYCLOVIR HCL 1 G PO TABS: 1000.0000 mg | ORAL_TABLET | Freq: Three times a day (TID) | ORAL | Status: DC

## 2012-07-21 MED ORDER — BENAZEPRIL-HYDROCHLOROTHIAZIDE 20-12.5 MG PO TABS: 1.0000 | ORAL_TABLET | Freq: Every day | ORAL | Status: DC

## 2012-07-21 MED ORDER — KETOCONAZOLE 2 % EX CREA: TOPICAL_CREAM | Freq: Every day | CUTANEOUS | Status: DC

## 2012-07-21 MED ORDER — INSULIN GLARGINE 100 UNIT/ML ~~LOC~~ SOLN: 32.0000 [IU] | Freq: Every day | SUBCUTANEOUS | Status: DC

## 2012-07-21 MED ORDER — INSULIN PEN NEEDLE 31G X 8 MM MISC: Status: DC

## 2012-07-21 NOTE — Progress Notes (Signed)
  Subjective:    Patient ID: Jesse Wilcox, male    DOB: 1951/08/30, 61 y.o.   MRN: [id number]  HPI  Patient is here with possible shingles on the left side of abdomen and left side of chest and back.  Has not had his neck surgery, fighting to get it approved. Seeing  Dr. Jacinto Halim.  Patient has been to Dr. Darrick Penna. They are awaiting approval to have his carotid surgery. He is watching his diabetes. He continues on Lantus insulin. He also has had problems with a zing irritation around the base of the glans of his penis and the foreskin to      Review of Systems     Objective:   Physical Exam HEENT exam is unremarkable. I do not hear definite carotid bruit. Chest is clear heart regular rate no murmurs abdomen soft there are 2  isolated lesions left side of the abdomen that dot have the appearance of shingles. There is a weeping oozing lesion present over the base of the glans which extends down to the foreskin  Results for orders placed in visit on 07/21/12  POCT CBC      Component Value Range   WBC 6.6  4.6 - 10.2 K/uL   Lymph, poc 2.4  0.6 - 3.4   POC LYMPH PERCENT 36.6  10 - 50 %L   MID (cbc) 0.5  0 - 0.9   POC MID % 7.4  0 - 12 %M   POC Granulocyte 3.7  2 - 6.9   Granulocyte percent 56.0  37 - 80 %G   RBC 5.67  4.69 - 6.13 M/uL   Hemoglobin 16.0  14.1 - 18.1 g/dL   HCT, POC 14.7  82.9 - 53.7 %   MCV 90.5  80 - 97 fL   MCH, POC 28.2  27 - 31.2 pg   MCHC 31.2 (*) 31.8 - 35.4 g/dL   RDW, POC 56.2     Platelet Count, POC 151  142 - 424 K/uL   MPV 11.0  0 - 99.8 fL  GLUCOSE, POCT (MANUAL RESULT ENTRY)      Component Value Range   POC Glucose 117 (*) 70 - 99 mg/dl  POCT GLYCOSYLATED HEMOGLOBIN (HGB A1C)      Component Value Range   Hemoglobin A1C 7.6         Assessment & Plan:  I have given him a prescription for ketoconazole cream for his foreskin and base of the glans. We'll treat him with Valtrex to cover him for shingles. He will continue to stay in contact with Dr. fields  regarding the need for his carotid surgery.

## 2012-08-03 ENCOUNTER — Encounter: Payer: Self-pay | Admitting: *Deleted

## 2012-08-03 DIAGNOSIS — R0602 Shortness of breath: Secondary | ICD-10-CM

## 2012-08-09 ENCOUNTER — Encounter: Payer: Self-pay | Admitting: Gastroenterology

## 2012-08-09 ENCOUNTER — Telehealth: Payer: Self-pay | Admitting: Gastroenterology

## 2012-08-09 NOTE — Telephone Encounter (Signed)
Please call Mr. Reddish and let him know what he is supposed to do thank you

## 2012-08-09 NOTE — Telephone Encounter (Signed)
Labs from 07/22/12, received.   It should be noted that Mr. Jesse Wilcox was a no show to his hepatology appointment on 04/07/12.  The [facility] office in [place] is now closed.  I will ask that Mr. Jesse Wilcox be contacted by our [facility] at [facility] office to make arrangements for Mr. Jesse Wilcox to be seen in our new [place] office.    All future correspondence regarding Mr. Jesse Wilcox or other patients should be faxed to [phone].

## 2012-08-10 NOTE — Telephone Encounter (Signed)
Advised pt of message below from Dr Jacqualine Mau. Pt stated he thinks he missed his appt because he has been trying to work on his problem w/the blocked artery in neck. Pt wanted Dr Cleta Alberts to know that he is seeing his heart doctor next week and will find out when the procedure can be done to take out the blockage, which ins will cover. Dr Cleta Alberts, routing this message back only FYI.

## 2012-08-10 NOTE — Telephone Encounter (Signed)
LMOM to CB. 

## 2012-08-16 ENCOUNTER — Encounter (HOSPITAL_COMMUNITY): Payer: Self-pay

## 2012-08-17 ENCOUNTER — Encounter: Payer: Self-pay | Admitting: Vascular Surgery

## 2012-08-18 ENCOUNTER — Encounter: Payer: Self-pay | Admitting: Vascular Surgery

## 2012-08-18 ENCOUNTER — Ambulatory Visit (INDEPENDENT_AMBULATORY_CARE_PROVIDER_SITE_OTHER): Payer: 59 | Admitting: Vascular Surgery

## 2012-08-18 VITALS — BP 145/82 | HR 77 | Ht 70.5 in | Wt 202.0 lb

## 2012-08-18 DIAGNOSIS — I6529 Occlusion and stenosis of unspecified carotid artery: Secondary | ICD-10-CM

## 2012-08-18 NOTE — Progress Notes (Signed)
[facility] [place] HISTORY AND PHYSICAL   History of Present Illness:  Patient is a 61 y.o. year old male who presents for follow-up evaluation for carotid stenosis.  He is on Plavix/Aspirin for antiplatelet therapy.  He previously underwent carotid angiogram in June of this year. At that time he is found to have a high-grade left internal carotid artery stenosis. The lesion extended quite high. For this reason he has been considered for carotid stenting. After several appeals his insurance company has now approved left carotid stenting. His atherosclerotic risk factors remain diabetes, elevated cholesterol, hypertension, smoking, and coronary artery disease.  These are all currently stable and followed by his primary care physician.  He denies any new neurologic events including amaurosis, numbness, or weakness.  Past Medical History  Diagnosis Date  . Chronic hepatitis C   . Diabetes mellitus   . Carotid artery occlusion     Past Surgical History  Procedure Date  . Aortic arch angiogram 04-15-12    By Dr. Jacinto Halim    Review of Systems:  Neurologic: as above Cardiac:denies shortness of breath or chest pain Pulmonary: denies cough or wheeze  Social History History  Substance Use Topics  . Smoking status: Current Every Day Smoker -- 0.5 packs/day for 42 years    Types: Cigarettes  . Smokeless tobacco: Never Used  . Alcohol Use: No    Allergies  Allergies  Allergen Reactions  . Iohexol      Code: SOB, Desc: Immed post contrast inj of Multihance, pt had difficulty breathing after several minutes pt felt better., Onset Date: [id number]      Current Outpatient Prescriptions  Medication Sig Dispense Refill  . amLODipine (NORVASC) 10 MG tablet Take 1 tablet (10 mg total) by mouth daily.  90 tablet  3  . aspirin EC 81 MG tablet Take 81 mg by mouth daily.      . benazepril-hydrochlorthiazide (LOTENSIN HCT) 20-12.5 MG per tablet Take 1 tablet by mouth daily.  90  tablet  3  . cyclobenzaprine (FLEXERIL) 5 MG tablet Take 0.5 tablets (2.5 mg total) by mouth at bedtime.  45 tablet  3  . insulin glargine (LANTUS SOLOSTAR) 100 UNIT/ML injection Inject 32 Units into the skin at bedtime.  10 pen  PRN  . Insulin Pen Needle 31G X 8 MM MISC Use as directed  100 each  10  . ketoconazole (NIZORAL) 2 % cream Apply topically daily.  75 g  3  . Multiple Vitamin (MULTIVITAMIN WITH MINERALS) TABS Take 1 tablet by mouth daily.       Plavix 75 mg po qd  Physical Examination  Filed Vitals:   08/18/12 0905 08/18/12 0908  BP: 149/86 145/82  Pulse: 77   Height: 5' 10.5" (1.791 m)   Weight: 202 lb (91.627 kg)   SpO2: 100%     Body mass index is 28.57 kg/(m^2).  General:  Alert and oriented, no acute distress HEENT: Normal Neck: No bruit or JVD Pulmonary: Clear to auscultation bilaterally Cardiac: Regular Rate and Rhythm without murmur Neurologic: Upper and lower extremity motor 5/5 and symmetric Extremities: 1-2+ femoral pulses bilaterally  DATA: Angiogram from June was reviewed today. This shows a high-grade greater than 80% left internal carotid artery stenosis which extends up to and slightly past angle of mandible. Overall the patient has type I arch. There is no significant right internal carotid artery stenosis.   ASSESSMENT:   High-grade asymptomatic left internal carotid artery stenosis. The patient is  scheduled for a left carotid stent for stroke prophylaxis on October 22. He was given a note today to miss work until October 28.   PLAN:  Left carotid stent October 22. Risks benefits possible complications and procedure details were explained to the patient and his sister today. These are included but not limited to bleeding infection myocardial events stroke risk of less than 5%.  He was also emphasized to the patient the advantages of carotid stenting for cranial nerve protection.  Fabienne Bruns, MD [facility]  [place] Office: [phone] Pager: [phone]

## 2012-08-19 ENCOUNTER — Encounter (HOSPITAL_COMMUNITY): Admission: RE | Payer: Self-pay | Source: Ambulatory Visit

## 2012-08-19 ENCOUNTER — Ambulatory Visit (HOSPITAL_COMMUNITY): Admission: RE | Admit: 2012-08-19 | Payer: 59 | Source: Ambulatory Visit | Admitting: Vascular Surgery

## 2012-08-19 SURGERY — CAROTID STENT INSERTION
Anesthesia: LOCAL

## 2012-08-23 ENCOUNTER — Encounter (HOSPITAL_COMMUNITY)
Admission: RE | Disposition: A | Discharge status: Home / Self Care | Payer: Self-pay | Source: Ambulatory Visit | Attending: Vascular Surgery

## 2012-08-23 ENCOUNTER — Encounter (HOSPITAL_COMMUNITY): Payer: Self-pay | Admitting: *Deleted

## 2012-08-23 ENCOUNTER — Telehealth: Payer: Self-pay | Admitting: Vascular Surgery

## 2012-08-23 ENCOUNTER — Inpatient Hospital Stay (HOSPITAL_COMMUNITY)
Admission: RE | Admit: 2012-08-23 | Discharge: 2012-08-24 | DRG: 036 | Disposition: A | Discharge status: Home / Self Care | Payer: 59 | Source: Ambulatory Visit | Attending: Vascular Surgery | Admitting: Vascular Surgery

## 2012-08-23 DIAGNOSIS — I6529 Occlusion and stenosis of unspecified carotid artery: Principal | ICD-10-CM | POA: Diagnosis present

## 2012-08-23 DIAGNOSIS — M62838 Other muscle spasm: Secondary | ICD-10-CM

## 2012-08-23 DIAGNOSIS — I10 Essential (primary) hypertension: Secondary | ICD-10-CM

## 2012-08-23 DIAGNOSIS — B182 Chronic viral hepatitis C: Secondary | ICD-10-CM | POA: Diagnosis present

## 2012-08-23 DIAGNOSIS — N481 Balanitis: Secondary | ICD-10-CM

## 2012-08-23 DIAGNOSIS — Z794 Long term (current) use of insulin: Secondary | ICD-10-CM

## 2012-08-23 DIAGNOSIS — Z7902 Long term (current) use of antithrombotics/antiplatelets: Secondary | ICD-10-CM

## 2012-08-23 DIAGNOSIS — E139 Other specified diabetes mellitus without complications: Secondary | ICD-10-CM

## 2012-08-23 DIAGNOSIS — Z7982 Long term (current) use of aspirin: Secondary | ICD-10-CM

## 2012-08-23 DIAGNOSIS — Z79899 Other long term (current) drug therapy: Secondary | ICD-10-CM

## 2012-08-23 DIAGNOSIS — E119 Type 2 diabetes mellitus without complications: Secondary | ICD-10-CM | POA: Diagnosis present

## 2012-08-23 HISTORY — PX: CAROTID STENT INSERTION: SHX5505

## 2012-08-23 LAB — GLUCOSE, CAPILLARY
Glucose-Capillary: 145 mg/dL — ABNORMAL HIGH (ref 70–99)
Glucose-Capillary: 111 mg/dL — ABNORMAL HIGH (ref 70–99)
Glucose-Capillary: 116 mg/dL — ABNORMAL HIGH (ref 70–99)

## 2012-08-23 LAB — POCT I-STAT, CHEM 8
BUN: 18 mg/dL (ref 6–23)
Creatinine, Ser: 1 mg/dL (ref 0.50–1.35)
Hemoglobin: 16.3 g/dL (ref 13.0–17.0)
Potassium: 3.6 mEq/L (ref 3.5–5.1)
Sodium: 140 mEq/L (ref 135–145)
TCO2: 23 mmol/L (ref 0–100)

## 2012-08-23 SURGERY — CAROTID STENT INSERTION
Anesthesia: LOCAL

## 2012-08-23 MED ORDER — ASPIRIN 81 MG PO CHEW
81.0000 mg | CHEWABLE_TABLET | Freq: Once | ORAL | Status: AC
  Administered 2012-08-23: 81 mg via ORAL

## 2012-08-23 MED ORDER — ACETAMINOPHEN 650 MG RE SUPP: 325.0000 mg | RECTAL | Status: DC | PRN

## 2012-08-23 MED ORDER — LABETALOL HCL 5 MG/ML IV SOLN: 10.0000 mg | INTRAVENOUS | Status: DC | PRN

## 2012-08-23 MED ORDER — ALUM & MAG HYDROXIDE-SIMETH 200-200-20 MG/5ML PO SUSP: 15.0000 mL | ORAL | Status: DC | PRN

## 2012-08-23 MED ORDER — METOPROLOL TARTRATE 1 MG/ML IV SOLN: 2.0000 mg | INTRAVENOUS | Status: DC | PRN

## 2012-08-23 MED ORDER — BIVALIRUDIN 250 MG IV SOLR
INTRAVENOUS | Status: AC
  Filled 2012-08-23: qty 250

## 2012-08-23 MED ORDER — NOREPINEPHRINE BITARTRATE 1 MG/ML IJ SOLN
INTRAMUSCULAR | Status: AC
  Filled 2012-08-23: qty 4

## 2012-08-23 MED ORDER — BENAZEPRIL HCL 20 MG PO TABS
20.0000 mg | ORAL_TABLET | Freq: Every day | ORAL | Status: DC
  Filled 2012-08-23 (×2): qty 1

## 2012-08-23 MED ORDER — ADULT MULTIVITAMIN W/MINERALS CH
1.0000 | ORAL_TABLET | Freq: Every day | ORAL | Status: DC
  Administered 2012-08-23: 1 via ORAL
  Filled 2012-08-23 (×2): qty 1

## 2012-08-23 MED ORDER — BENAZEPRIL-HYDROCHLOROTHIAZIDE 20-12.5 MG PO TABS: 1.0000 | ORAL_TABLET | Freq: Every day | ORAL | Status: DC

## 2012-08-23 MED ORDER — ASPIRIN 81 MG PO CHEW
CHEWABLE_TABLET | ORAL | Status: AC
  Filled 2012-08-23: qty 1

## 2012-08-23 MED ORDER — SODIUM CHLORIDE 0.9 % IV SOLN
INTRAVENOUS | Status: DC
  Administered 2012-08-23: 1000 mL via INTRAVENOUS

## 2012-08-23 MED ORDER — SODIUM CHLORIDE 0.9 % IV SOLN: 500.0000 mL | Freq: Once | INTRAVENOUS | Status: AC | PRN

## 2012-08-23 MED ORDER — CYCLOBENZAPRINE HCL 5 MG PO TABS
2.5000 mg | ORAL_TABLET | Freq: Every day | ORAL | Status: DC
  Administered 2012-08-23: 2.5 mg via ORAL
  Filled 2012-08-23 (×2): qty 0.5

## 2012-08-23 MED ORDER — ASPIRIN EC 81 MG PO TBEC
81.0000 mg | DELAYED_RELEASE_TABLET | Freq: Every day | ORAL | Status: DC
  Administered 2012-08-23: 81 mg via ORAL
  Filled 2012-08-23 (×2): qty 1

## 2012-08-23 MED ORDER — MAGNESIUM SULFATE 40 MG/ML IJ SOLN
2.0000 g | Freq: Once | INTRAMUSCULAR | Status: AC | PRN
  Filled 2012-08-23: qty 50

## 2012-08-23 MED ORDER — INSULIN GLARGINE 100 UNIT/ML ~~LOC~~ SOLN
32.0000 [IU] | Freq: Every day | SUBCUTANEOUS | Status: DC
  Administered 2012-08-23: 32 [IU] via SUBCUTANEOUS

## 2012-08-23 MED ORDER — PNEUMOCOCCAL VAC POLYVALENT 25 MCG/0.5ML IJ INJ
0.5000 mL | INJECTION | INTRAMUSCULAR | Status: DC
  Filled 2012-08-23: qty 0.5

## 2012-08-23 MED ORDER — ACETAMINOPHEN 325 MG PO TABS: 325.0000 mg | ORAL_TABLET | ORAL | Status: DC | PRN

## 2012-08-23 MED ORDER — CLOPIDOGREL BISULFATE 75 MG PO TABS
75.0000 mg | ORAL_TABLET | Freq: Every day | ORAL | Status: DC
  Filled 2012-08-23: qty 1

## 2012-08-23 MED ORDER — AMLODIPINE BESYLATE 10 MG PO TABS
10.0000 mg | ORAL_TABLET | Freq: Every day | ORAL | Status: DC
  Filled 2012-08-23: qty 1

## 2012-08-23 MED ORDER — PHENOL 1.4 % MT LIQD: 1.0000 | OROMUCOSAL | Status: DC | PRN

## 2012-08-23 MED ORDER — KETOCONAZOLE 2 % EX CREA
TOPICAL_CREAM | Freq: Every day | CUTANEOUS | Status: DC
  Filled 2012-08-23: qty 15

## 2012-08-23 MED ORDER — ATROPINE SULFATE 1 MG/ML IJ SOLN
INTRAMUSCULAR | Status: AC
  Filled 2012-08-23: qty 1

## 2012-08-23 MED ORDER — INSULIN ASPART 100 UNIT/ML ~~LOC~~ SOLN
0.0000 [IU] | Freq: Three times a day (TID) | SUBCUTANEOUS | Status: DC
  Administered 2012-08-23: 3 [IU] via SUBCUTANEOUS
  Administered 2012-08-24: 2 [IU] via SUBCUTANEOUS

## 2012-08-23 MED ORDER — MORPHINE SULFATE 2 MG/ML IJ SOLN: 2.0000 mg | INTRAMUSCULAR | Status: DC | PRN

## 2012-08-23 MED ORDER — ONDANSETRON HCL 4 MG/2ML IJ SOLN: 4.0000 mg | Freq: Four times a day (QID) | INTRAMUSCULAR | Status: DC | PRN

## 2012-08-23 MED ORDER — HYDROCHLOROTHIAZIDE 12.5 MG PO CAPS
12.5000 mg | ORAL_CAPSULE | Freq: Every day | ORAL | Status: DC
  Filled 2012-08-23 (×2): qty 1

## 2012-08-23 MED ORDER — LIDOCAINE HCL (PF) 1 % IJ SOLN
INTRAMUSCULAR | Status: AC
  Filled 2012-08-23: qty 30

## 2012-08-23 MED ORDER — PANTOPRAZOLE SODIUM 40 MG PO TBEC
40.0000 mg | DELAYED_RELEASE_TABLET | Freq: Every day | ORAL | Status: DC
  Administered 2012-08-23: 40 mg via ORAL
  Filled 2012-08-23: qty 1

## 2012-08-23 MED ORDER — GUAIFENESIN-DM 100-10 MG/5ML PO SYRP: 15.0000 mL | ORAL_SOLUTION | ORAL | Status: DC | PRN

## 2012-08-23 MED ORDER — OXYCODONE HCL 5 MG PO TABS: 5.0000 mg | ORAL_TABLET | ORAL | Status: DC | PRN

## 2012-08-23 MED ORDER — DOPAMINE-DEXTROSE 3.2-5 MG/ML-% IV SOLN: 3.0000 ug/kg/min | INTRAVENOUS | Status: DC

## 2012-08-23 MED ORDER — POTASSIUM CHLORIDE CRYS ER 20 MEQ PO TBCR: 20.0000 meq | EXTENDED_RELEASE_TABLET | Freq: Once | ORAL | Status: AC | PRN

## 2012-08-23 MED ORDER — DOCUSATE SODIUM 100 MG PO CAPS: 100.0000 mg | ORAL_CAPSULE | Freq: Every day | ORAL | Status: DC

## 2012-08-23 MED ORDER — HYDRALAZINE HCL 20 MG/ML IJ SOLN: 10.0000 mg | INTRAMUSCULAR | Status: DC | PRN

## 2012-08-23 NOTE — Telephone Encounter (Addendum)
Message copied by Shari Prows on Tue Aug 23, 2012  9:52 AM ------      Message from: Sherren Kerns      Created: Tue Aug 23, 2012  9:02 AM       Left carotid stent      Berry's turn to bill            Needs follow up duplex in 2 weeks and see me            Fabienne Bruns, MD      [facility] [place]      Office: [phone]      Pager: [phone]        I scheduled an appointment for the above pt for 09/08/12 at 10:30am and to see CEF at 11:30am. I left a message for the pt and also mailed an appt letter.awt

## 2012-08-23 NOTE — Plan of Care (Signed)
Problem: Phase I Progression Outcomes Goal: Vascular site scale level 0 - I Vascular Site Scale Level 0: No bruising/bleeding/hematoma Level I (Mild): Bruising/Ecchymosis, minimal bleeding/ooozing, palpable hematoma < 3 cm Level II (Moderate): Bleeding not affecting hemodynamic parameters, pseudoaneurysm, palpable hematoma > 3 cm Outcome: Completed/Met Date Met:  08/23/12 Level 0

## 2012-08-23 NOTE — Progress Notes (Signed)
Larry sprinkle rn from cath lab came and removed sheath. Applied pressure x49min then applied pressure dressing.  will continue to monitor site. Beryle Quant

## 2012-08-23 NOTE — H&P (Signed)
The patient has been re-examined.  The patient's history and physical has been reviewed and is unchanged.  There is no change in the plan of care.  Kamel Haven, MD 

## 2012-08-23 NOTE — Op Note (Addendum)
Procedure: Left carotid angiogram, left carotid angioplasty and stenting   Preoperative diagnosis: High-grade left internal carotid artery stenosis  Postoperative diagnosis: Same   Anesthesia: Local  Co-surgeon: Nanetta Batty M.D.   Operative details: After obtaining informed consent, the patient was taken to the [facility] lab. The patient was placed in supine position the Angio table. Both groins were prepped and draped in usual sterile fashion. Local anesthesia was infiltrated over the right common femoral artery. An introducer needle was placed into the right common femoral artery and there was good backbleeding from this. A 0.035 Versacore wire was then threaded up the abdominal aorta under fluoroscopic guidance. A 6 French Cook shuttle sheath was then brought up on the operative field and advanced over the guidewire up into the ascending aorta. At this point an infusion of Angiomax was begun.A 5 French JB1 catheter was then advanced up into the aortic arch and a contrast angiogram was performed to determine the precise location of the left common carotid artery. Using the JB1 catheter I then advanced the tip of the catheter into the left common carotid artery. The Versacore wire was removed and exchanged for an 035 Amplatz wire. The Amplatz wire was gently advanced up into the common carotid artery. The JB1 catheter was then gently pulled back over the guidewire and the dilator placed back over the sheath and this advanced over the guidewire into the left common carotid artery.  ACT was also checked and was 344. At this point a cordis Angio guard 7 mm filter was brought on the operative field and this was advanced up to the level of the carotid stenosis.  With some manipulation the guidewire advanced through the stenosis with minimal difficulty. The filter was then advanced into the distal internal carotid artery at the level of the skull base. The filter was then deployed in this location. At this point a  repeat contrast angiogram was performed to again determined the level of the stenosis. A 3 x 2 angioplasty balloon was brought in operative field and advanced to the level of stenosis and quickly inflated and deflated to 10 atmospheres. The predilatation balloon was removed. A 9 x 40 Cordis precise stent was then brought in the operative field and advanced to the level of stenosis. Again using angiographic guidance, the stent was deployed so that the tip of the stent was past level of the high-grade stenosis. The stent was then deployed using a pinch pull technique. A postdilatation was then performed after the stent delivery device was removed. This was done with a 5 x 2 balloon inflated to 10 atmospheres for 5 seconds. A completion arteriogram was performed which showed that the residual stenosis was essentially 0 and the stent had good wall apposition. Completion intracranial views were also performed in AP and lateral projection to be interpreted later by the neuroradiologist. The patient tolerated procedure well and there were no complications. After the completion arteriogram had been performed, the sheath dilator was placed back in the sheath over a guidewire and these were then pulled down to the guidewire and the sheath exchanged for a 6 French short sheath in the right femoral artery. This was thoroughly flushed with heparinized saline and was left in place to be pulled in the holding area after the ACT is less than 175. Final ACT was 310. The patient tolerated the procedure well and there were no neurologic complications. The patient was transported to the holding area in stable condition.   Fabienne Bruns, MD  [facility] [place]  Office: [phone]  Pager: [phone]

## 2012-08-23 NOTE — Progress Notes (Signed)
Utilization Review Completed.  

## 2012-08-24 ENCOUNTER — Encounter: Payer: Self-pay | Admitting: *Deleted

## 2012-08-24 LAB — GLUCOSE, CAPILLARY

## 2012-08-24 MED ORDER — OXYCODONE HCL 5 MG PO TABS: 5.0000 mg | ORAL_TABLET | ORAL | Status: DC | PRN

## 2012-08-24 MED FILL — Dextrose Inj 5%: INTRAVENOUS | Qty: 50 | Status: AC

## 2012-08-24 NOTE — Discharge Summary (Signed)
[facility] Discharge Summary   Patient ID:  Jesse Wilcox MRN: [id number] DOB/AGE: [birth year] 61 y.o.  Admit date: 08/23/2012 Discharge date: 08/24/2012 Date of Surgery: 08/23/2012 Surgeon: Surgeon(s): Sherren Kerns, MD  Admission Diagnosis: Stenosis  Discharge Diagnoses:  Stenosis  Secondary Diagnoses: Past Medical History  Diagnosis Date  . Chronic hepatitis C   . Diabetes mellitus   . Carotid artery occlusion     Procedure(s): CAROTID STENT INSERTION  Discharged Condition: good  HPI:  History of Present Illness: Patient is a 61 y.o. year old male who presents for follow-up evaluation for carotid stenosis. He is on Plavix/Aspirin for antiplatelet therapy. He previously underwent carotid angiogram in June of this year. At that time he is found to have a high-grade left internal carotid artery stenosis. The lesion extended quite high. For this reason he has been considered for carotid stenting. After several appeals his insurance company has now approved left carotid stenting. His atherosclerotic risk factors remain diabetes, elevated cholesterol, hypertension, smoking, and coronary artery disease. These are all currently stable and followed by his primary care physician. He denies any new neurologic events including amaurosis, numbness, or weakness.   Hospital Course:  Jesse Wilcox is a 61 y.o. male is S/P Left Procedure(s): CAROTID STENT INSERTION Extubated: POD # 0 Post-op wounds clean, dry, intact or healing well right groin Pt. Ambulating, voiding and taking PO diet without difficulty. Pt pain controlled with PO pain meds. Labs as below Complications:none  Consults:     Significant Diagnostic Studies: CBC Lab Results  Component Value Date   WBC 6.6 07/21/2012   HGB 16.3 08/23/2012   HCT 48.0 08/23/2012   MCV 90.5 07/21/2012   PLT 162 01/09/2009    BMET    Component Value Date/Time   NA 140 08/23/2012 0619   K 3.6  08/23/2012 0619   CL 105 08/23/2012 0619   CO2 27 07/21/2012 1102   GLUCOSE 135* 08/23/2012 0619   BUN 18 08/23/2012 0619   CREATININE 1.00 08/23/2012 0619   CREATININE 0.78 07/21/2012 1102   CALCIUM 9.2 07/21/2012 1102   COAG Lab Results  Component Value Date   INR 1.0 01/09/2009     Disposition:  Discharge to :Home Discharge Orders    Future Appointments: Provider: Department: Dept Phone: Center:   09/08/2012 10:30 AM [facility] [phone] [facility]   09/08/2012 11:30 AM Sherren Kerns, MD [facility] [phone] [facility]     Future Orders Please Complete By Expires   Resume previous diet      Driving Restrictions      Comments:   No driving for 1 day   Lifting restrictions      Comments:   No heavy  Lifting for 6 weeks   Call MD for:  temperature >100.5      Call MD for:  redness, tenderness, or signs of infection (pain, swelling, bleeding, redness, odor or green/yellow discharge around incision site)      Call MD for:  severe or increased pain, loss or decreased feeling  in affected limb(s)      Increase activity slowly      Comments:   Walk with assistance use walker or cane as needed   May shower       Scheduling Instructions:   Remove dressing and shower tomorrow      [name], [name]  Home Medication Instructions [id number]   Printed on:08/24/12 6433  Medication Information  aspirin EC 81 MG tablet Take 81 mg by mouth daily.           Insulin Pen Needle 31G X 8 MM MISC Use as directed           benazepril-hydrochlorthiazide (LOTENSIN HCT) 20-12.5 MG per tablet Take 1 tablet by mouth daily.           amLODipine (NORVASC) 10 MG tablet Take 1 tablet (10 mg total) by mouth daily.           cyclobenzaprine (FLEXERIL) 5 MG tablet Take 0.5 tablets (2.5 mg total) by mouth at bedtime.           insulin glargine (LANTUS SOLOSTAR) 100 UNIT/ML injection Inject 32 Units into the skin at bedtime.           ketoconazole  (NIZORAL) 2 % cream Apply topically daily.           Multiple Vitamin (MULTIVITAMIN WITH MINERALS) TABS Take 1 tablet by mouth daily.           clopidogrel (PLAVIX) 75 MG tablet Take 75 mg by mouth daily.           oxyCODONE (OXY IR/ROXICODONE) 5 MG immediate release tablet Take 1-2 tablets (5-10 mg total) by mouth every 4 (four) hours as needed.            Verbal and written Discharge instructions given to the patient. Wound care per Discharge AVS Follow-up Information    Follow up with Fabienne Bruns E, MD. In 2 weeks. (sent)    Contact information:   [address] [place] [address] [phone]        Continue Plavix and Asprin until follow up visit in 2 weeks.  SignedMosetta Pigeon 08/24/2012, 7:33 AM

## 2012-08-24 NOTE — Progress Notes (Signed)
Pt voiding, eating and ambulating without difficulty.  Discharge instructions given to pt and sister.  Both verbalized understanding with all questions answered.  Pt discharged to home with sister.  Roselie Awkward, RN

## 2012-08-24 NOTE — Progress Notes (Signed)
[facility] [place]  Subjective  - POD #1  Carotid stent placement left.  History of Present Illness: Patient is a 61 y.o. year old male who presents for follow-up evaluation for carotid stenosis. He is on Plavix/Aspirin for antiplatelet therapy. He previously underwent carotid angiogram in June of this year. At that time he is found to have a high-grade left internal carotid artery stenosis. The lesion extended quite high. For this reason he has been considered for carotid stenting. After several appeals his insurance company has now approved left carotid stenting. His atherosclerotic risk factors remain diabetes, elevated cholesterol, hypertension, smoking, and coronary artery disease. These are all currently stable and followed by his primary care physician. He denies any new neurologic events including amaurosis, numbness, or weakness.  Objective 129/69 74 98.6 F (37 C) (Oral) 20 92%  Intake/Output Summary (Last 24 hours) at 08/24/12 0722 Last data filed at 08/24/12 0719  Gross per 24 hour  Intake    960 ml  Output   1550 ml  Net   -590 ml    N/V/M intact.  No tongue deviation, smile is symmetrical. Strength of all four extremities in 5/5.  Palpable DP/PT pulses 2+ equal bilaterally.  Assessment/Planning: POD #1  Carotid stent left.    D/C home  [facility] 08/24/2012 7:22 AM --  Laboratory Lab Results:  [facility] 08/23/12 0619  WBC --  HGB 16.3  HCT 48.0  PLT --   BMET  Basename 08/23/12 0619  NA 140  K 3.6  CL 105  CO2 --  GLUCOSE 135*  BUN 18  CREATININE 1.00  CALCIUM --    COAG Lab Results  Component Value Date   INR 1.0 01/09/2009   No results found for this basename: PTT    Antibiotics Anti-infectives    None

## 2012-08-29 ENCOUNTER — Other Ambulatory Visit: Payer: Self-pay | Admitting: *Deleted

## 2012-08-29 ENCOUNTER — Encounter: Payer: Self-pay | Admitting: *Deleted

## 2012-08-29 DIAGNOSIS — Z48812 Encounter for surgical aftercare following surgery on the circulatory system: Secondary | ICD-10-CM

## 2012-08-29 DIAGNOSIS — I6529 Occlusion and stenosis of unspecified carotid artery: Secondary | ICD-10-CM

## 2012-09-05 ENCOUNTER — Telehealth: Payer: Self-pay

## 2012-09-05 NOTE — Telephone Encounter (Signed)
The patient called to request that the record of his flu shot from September 2013 be faxed to his employer [employer]) Please fax to [phone].

## 2012-09-05 NOTE — Telephone Encounter (Signed)
Faxed over the requested information to Fax#:[phone].

## 2012-09-07 ENCOUNTER — Encounter: Payer: Self-pay | Admitting: Vascular Surgery

## 2012-09-08 ENCOUNTER — Other Ambulatory Visit (INDEPENDENT_AMBULATORY_CARE_PROVIDER_SITE_OTHER): Payer: 59 | Admitting: *Deleted

## 2012-09-08 ENCOUNTER — Ambulatory Visit (INDEPENDENT_AMBULATORY_CARE_PROVIDER_SITE_OTHER): Payer: 59 | Admitting: Vascular Surgery

## 2012-09-08 ENCOUNTER — Encounter: Payer: Self-pay | Admitting: Vascular Surgery

## 2012-09-08 VITALS — BP 141/71 | HR 70 | Temp 97.7°F | Ht 71.0 in | Wt 199.0 lb

## 2012-09-08 DIAGNOSIS — Z48812 Encounter for surgical aftercare following surgery on the circulatory system: Secondary | ICD-10-CM

## 2012-09-08 DIAGNOSIS — I6529 Occlusion and stenosis of unspecified carotid artery: Secondary | ICD-10-CM

## 2012-09-08 NOTE — Progress Notes (Signed)
[facility] [place] HISTORY AND PHYSICAL    History of Present Illness:  Patient is a 61 y.o. year old male who presents for post-operative follow-up after left carotid stent procedure 08/23/2012.  Denies headaches, numbness, tingling or other neuro deficits.  No swallowing problems.  He has returned to work full time. He is on aspirin and Plavix. He has had no bleeding episodes.   Physical Examination  Filed Vitals:   09/08/12 1108  BP: 141/71  Pulse:   Temp:     Body mass index is 27.75 kg/(m^2).  General:  Alert and oriented, no acute distress Neck: No bruit or JVD Skin: No rash Extremities:  2+ femoral pulses bilaterally, 2+ radial pulses bilaterally Neurologic: Upper and lower extremity motor 5/5 and symmetric  DATA: Patient had a carotid duplex scan today which I reviewed and interpreted. This showed a widely patent left internal carotid stent with no evidence of restenosis bilateral antegrade vertebral flow   ASSESSMENT: Patent left internal carotid artery stent   PLAN: Continue Plavix and aspirin indefinitely. Followup carotid duplex scan in 6 months.  Fabienne Bruns, MD [facility] [place] Office: [phone] Pager: [phone]   Fabienne Bruns, MD [facility] [place] Office: [phone] Pager: [phone]

## 2012-10-22 ENCOUNTER — Ambulatory Visit (INDEPENDENT_AMBULATORY_CARE_PROVIDER_SITE_OTHER): Payer: 59 | Admitting: Emergency Medicine

## 2012-10-22 VITALS — BP 138/74 | HR 76 | Temp 98.4°F | Resp 18 | Ht 70.0 in | Wt 198.0 lb

## 2012-10-22 DIAGNOSIS — E119 Type 2 diabetes mellitus without complications: Secondary | ICD-10-CM

## 2012-10-22 DIAGNOSIS — B182 Chronic viral hepatitis C: Secondary | ICD-10-CM | POA: Insufficient documentation

## 2012-10-22 LAB — POCT GLYCOSYLATED HEMOGLOBIN (HGB A1C): Hemoglobin A1C: 7.4

## 2012-10-22 NOTE — Progress Notes (Signed)
  Subjective:    Patient ID: Jesse Wilcox, male    DOB: 06-26-1951, 61 y.o.   MRN: [id number]  HPI  61 year old male presents for a diabetic follow up.  Since he had his procedure everything is doing good.  Less stress and still loosing weight.  Hep C doctor is coming from [place] and will refer him there.  They tried to refer him to [facility] for Hep C clinic and he refused to go. Since his last visit here he has had his carotid surgery. He did well with this. He says his sugars have been running much better and range since he has not been under a lot of stress been relaxing and recovering.      Review of Systems     Objective:   Physical Exam HEENT exam is unremarkable. There are no carotid bruits audible. Chest is clear to auscultation and percussion cardiac exam revealed a regular rate without murmurs. Abdomen is soft without tenderness or masses. Extremity exam reveals good pulses normal sensation. He does have significant dystrophic nails  Results for orders placed in visit on 10/22/12  GLUCOSE, POCT (MANUAL RESULT ENTRY)      Component Value Range   POC Glucose 225 (*) 70 - 99 mg/dl  POCT GLYCOSYLATED HEMOGLOBIN (HGB A1C)      Component Value Range   Hemoglobin A1C 7.4          Assessment & Plan:  Patient is not at goal but he is going to work harder on his diet. He says he ate some cookies before he came in today. His last hemoglobin A1c was 7.67.4 today but he states his morning sugars have been running in the 80s still is hesitant to increase his Lantus. I made a referral to the hep C clinic for evaluation. He will be seeing a hepatologist but comes here from [place]

## 2012-11-22 ENCOUNTER — Other Ambulatory Visit: Payer: Self-pay | Admitting: Family Medicine

## 2012-11-24 ENCOUNTER — Ambulatory Visit (INDEPENDENT_AMBULATORY_CARE_PROVIDER_SITE_OTHER): Payer: 59 | Admitting: Emergency Medicine

## 2012-11-24 VITALS — BP 118/70 | HR 74 | Temp 97.8°F | Resp 16 | Ht 70.0 in | Wt 197.0 lb

## 2012-11-24 DIAGNOSIS — R079 Chest pain, unspecified: Secondary | ICD-10-CM

## 2012-11-24 DIAGNOSIS — E119 Type 2 diabetes mellitus without complications: Secondary | ICD-10-CM

## 2012-11-24 DIAGNOSIS — B1920 Unspecified viral hepatitis C without hepatic coma: Secondary | ICD-10-CM

## 2012-11-24 NOTE — Progress Notes (Signed)
  Subjective:    Patient ID: Jesse Wilcox, male    DOB: 1950/11/06, 62 y.o.   MRN: [id number]  HPI Pt here for his DM Sugar has been elevated ~270-280 He's out of test strips Is currently taking 26 U of insulin; is supposed to take 32 U Feels ok otherwise. Had an episode of chest pain a few days ago that went away after taking Tums and eating. No CP with exertion. Not currently exercising. Patient states he ate some food and help he has not been having any exertional chest pain. He's had previous cardiac imaging done.     Review of Systems      Objective:   Physical Exam Chest is clear to auscultation and percussion cardiac unremarkable abdomen soft nontender  Results for orders placed in visit on 11/24/12  GLUCOSE, POCT (MANUAL RESULT ENTRY)      Component Value Range   POC Glucose 243 (*) 70 - 99 mg/dl        Assessment & Plan:  Referral to be made to the hep C clinic. Referral to be made to cardiology for reevaluation of chest pain .

## 2012-11-24 NOTE — Patient Instructions (Signed)
Increase your Lantus insulin by 2 units every 2-3 days to achieve a morning sugar between 120 and 160. I have made referrals to the hep C clinic here in town and a referral to cardiology for evaluation

## 2012-11-25 ENCOUNTER — Telehealth: Payer: Self-pay

## 2012-11-25 NOTE — Telephone Encounter (Signed)
Can you send referral to another cardiologist?

## 2012-11-25 NOTE — Telephone Encounter (Signed)
Pt called because dr Cleta Alberts had referred pt to a cardiologist and pt requested not to be scheduled with dr Jacinto Halim. Pt is upset because the appt has been set up with dr Jacinto Halim and is requesting we send him somewhere else.  Please call pt at [phone]

## 2012-12-08 NOTE — Progress Notes (Signed)
Pt waiting on insurance approval no charge for visit  Fabienne Bruns MD

## 2012-12-28 ENCOUNTER — Telehealth: Payer: Self-pay

## 2012-12-28 NOTE — Telephone Encounter (Signed)
Spoke with pt, he needs a refill on his Flexeril. Please advise

## 2012-12-29 ENCOUNTER — Telehealth: Payer: Self-pay | Admitting: Radiology

## 2012-12-29 DIAGNOSIS — M62838 Other muscle spasm: Secondary | ICD-10-CM

## 2012-12-29 MED ORDER — CYCLOBENZAPRINE HCL 5 MG PO TABS: 2.5000 mg | ORAL_TABLET | Freq: Every day | ORAL | Status: DC

## 2012-12-29 NOTE — Telephone Encounter (Signed)
LMOM for pt that RFs of flexeril were sent to [facility] pharmacy.

## 2012-12-29 NOTE — Telephone Encounter (Signed)
Pt called back again to see if there is a problem w/RFing his Flexeril. He would like Dr Cleta Alberts to review request and let him know if he needs to RTC to see him today. Dr Cleta Alberts, please advise.

## 2012-12-29 NOTE — Telephone Encounter (Signed)
Patient wants Rx for Flexeril sent in for him to take at night, Dr Cleta Alberts has approved. This was sent to [facility] pharmacy.

## 2012-12-29 NOTE — Telephone Encounter (Signed)
Okay to refill his Flexeril.

## 2012-12-30 ENCOUNTER — Other Ambulatory Visit: Payer: Self-pay | Admitting: Emergency Medicine

## 2013-01-26 ENCOUNTER — Encounter: Payer: Self-pay | Admitting: Emergency Medicine

## 2013-02-06 ENCOUNTER — Ambulatory Visit (INDEPENDENT_AMBULATORY_CARE_PROVIDER_SITE_OTHER): Payer: 59 | Admitting: Emergency Medicine

## 2013-02-06 VITALS — BP 138/64 | HR 83 | Temp 97.9°F | Resp 16 | Ht 70.0 in | Wt 196.6 lb

## 2013-02-06 DIAGNOSIS — I10 Essential (primary) hypertension: Secondary | ICD-10-CM

## 2013-02-06 DIAGNOSIS — E119 Type 2 diabetes mellitus without complications: Secondary | ICD-10-CM

## 2013-02-06 DIAGNOSIS — E139 Other specified diabetes mellitus without complications: Secondary | ICD-10-CM

## 2013-02-06 DIAGNOSIS — B182 Chronic viral hepatitis C: Secondary | ICD-10-CM

## 2013-02-06 DIAGNOSIS — R6889 Other general symptoms and signs: Secondary | ICD-10-CM

## 2013-02-06 LAB — POCT CBC
HCT, POC: 45.8 % (ref 43.5–53.7)
Lymph, poc: 3 (ref 0.6–3.4)
MCH, POC: 27.9 pg (ref 27–31.2)
MCHC: 31.7 g/dL — AB (ref 31.8–35.4)
POC Granulocyte: 3.7 (ref 2–6.9)
POC LYMPH PERCENT: 42.1 %L (ref 10–50)
RDW, POC: 13.7 %
WBC: 7.1 10*3/uL (ref 4.6–10.2)

## 2013-02-06 LAB — COMPREHENSIVE METABOLIC PANEL
Alkaline Phosphatase: 106 U/L (ref 39–117)
BUN: 17 mg/dL (ref 6–23)
Creat: 0.86 mg/dL (ref 0.50–1.35)
Glucose, Bld: 152 mg/dL — ABNORMAL HIGH (ref 70–99)
Sodium: 135 mEq/L (ref 135–145)
Total Bilirubin: 0.5 mg/dL (ref 0.3–1.2)

## 2013-02-06 LAB — POCT GLYCOSYLATED HEMOGLOBIN (HGB A1C): Hemoglobin A1C: 8.6

## 2013-02-06 MED ORDER — AMLODIPINE BESYLATE 10 MG PO TABS: 10.0000 mg | ORAL_TABLET | Freq: Every day | ORAL | Status: DC

## 2013-02-06 MED ORDER — CLOPIDOGREL BISULFATE 75 MG PO TABS: 75.0000 mg | ORAL_TABLET | Freq: Every day | ORAL | Status: DC

## 2013-02-06 MED ORDER — GLUCOSE BLOOD VI STRP: ORAL_STRIP | Status: DC

## 2013-02-06 MED ORDER — BENAZEPRIL-HYDROCHLOROTHIAZIDE 20-12.5 MG PO TABS: 1.0000 | ORAL_TABLET | Freq: Every day | ORAL | Status: DC

## 2013-02-06 MED ORDER — INSULIN GLARGINE 100 UNIT/ML ~~LOC~~ SOLN: SUBCUTANEOUS | Status: DC

## 2013-02-06 MED ORDER — INSULIN PEN NEEDLE 31G X 8 MM MISC: Status: DC

## 2013-02-06 NOTE — Progress Notes (Signed)
  Subjective:    Patient ID: Jesse Wilcox, male    DOB: 1951-07-21, 62 y.o.   MRN: [id number]  HPI patient enters with the chief complaint of needing to have blood work done prior to his visit is to see the hepatitis C Dr. tomorrow. His diabetes has been under fair control. He is trying to watch his diet and exercise more. He has been taking his insulin and is up to 38 units a day    Review of Systems     Objective:   Physical Exam    Results for orders placed in visit on 02/06/13  POCT CBC      Result Value Range   WBC 7.1  4.6 - 10.2 K/uL   Lymph, poc 3.0  0.6 - 3.4   POC LYMPH PERCENT 42.1  10 - 50 %L   MID (cbc) 0.4  0 - 0.9   POC MID % 5.8  0 - 12 %M   POC Granulocyte 3.7  2 - 6.9   Granulocyte percent 52.1  37 - 80 %G   RBC 5.19  4.69 - 6.13 M/uL   Hemoglobin 14.5  14.1 - 18.1 g/dL   HCT, POC 40.9  81.1 - 53.7 %   MCV 88.3  80 - 97 fL   MCH, POC 27.9  27 - 31.2 pg   MCHC 31.7 (*) 31.8 - 35.4 g/dL   RDW, POC 91.4     Platelet Count, POC 124 (*) 142 - 424 K/uL   MPV 10.2  0 - 99.8 fL  GLUCOSE, POCT (MANUAL RESULT ENTRY)      Result Value Range   POC Glucose 146 (*) 70 - 99 mg/dl  POCT GLYCOSYLATED HEMOGLOBIN (HGB A1C)      Result Value Range   Hemoglobin A1C 8.6        Assessment & Plan:  Patient stable with his diabetes. His sugar is not under the best control. Advised him to increase his Lantus over the next few weeks to try and get a fasting sugar in the morning between 120 and 140 his hemoglobin A1c today was 8.6. His last hemoglobin A1c was 7.4

## 2013-02-07 ENCOUNTER — Encounter: Payer: Self-pay | Admitting: Radiology

## 2013-02-07 LAB — AFP TUMOR MARKER: AFP-Tumor Marker: 27.1 ng/mL — ABNORMAL HIGH (ref 0.0–8.0)

## 2013-02-15 ENCOUNTER — Other Ambulatory Visit: Payer: Self-pay | Admitting: Internal Medicine

## 2013-02-20 ENCOUNTER — Other Ambulatory Visit (HOSPITAL_COMMUNITY): Payer: Self-pay | Admitting: Internal Medicine

## 2013-02-20 DIAGNOSIS — B182 Chronic viral hepatitis C: Secondary | ICD-10-CM

## 2013-03-02 ENCOUNTER — Other Ambulatory Visit (HOSPITAL_COMMUNITY): Payer: Self-pay | Admitting: Internal Medicine

## 2013-03-02 ENCOUNTER — Encounter (HOSPITAL_COMMUNITY): Payer: Self-pay

## 2013-03-02 ENCOUNTER — Ambulatory Visit (HOSPITAL_COMMUNITY)
Admission: RE | Admit: 2013-03-02 | Discharge: 2013-03-02 | Disposition: A | Discharge status: Home / Self Care | Payer: 59 | Source: Ambulatory Visit | Attending: Internal Medicine | Admitting: Internal Medicine

## 2013-03-02 DIAGNOSIS — K766 Portal hypertension: Secondary | ICD-10-CM | POA: Insufficient documentation

## 2013-03-02 DIAGNOSIS — R911 Solitary pulmonary nodule: Secondary | ICD-10-CM | POA: Insufficient documentation

## 2013-03-02 DIAGNOSIS — B182 Chronic viral hepatitis C: Secondary | ICD-10-CM

## 2013-03-02 DIAGNOSIS — J9819 Other pulmonary collapse: Secondary | ICD-10-CM | POA: Insufficient documentation

## 2013-03-02 DIAGNOSIS — K7460 Unspecified cirrhosis of liver: Secondary | ICD-10-CM | POA: Insufficient documentation

## 2013-03-02 DIAGNOSIS — I7090 Unspecified atherosclerosis: Secondary | ICD-10-CM | POA: Insufficient documentation

## 2013-03-02 MED ORDER — IOHEXOL 300 MG/ML  SOLN
100.0000 mL | Freq: Once | INTRAMUSCULAR | Status: AC | PRN
  Administered 2013-03-02: 100 mL via INTRAVENOUS

## 2013-03-09 ENCOUNTER — Other Ambulatory Visit: Payer: 59

## 2013-03-09 ENCOUNTER — Telehealth: Payer: Self-pay | Admitting: Radiology

## 2013-03-09 ENCOUNTER — Ambulatory Visit: Payer: 59 | Admitting: Vascular Surgery

## 2013-03-09 DIAGNOSIS — R911 Solitary pulmonary nodule: Secondary | ICD-10-CM

## 2013-03-09 NOTE — Telephone Encounter (Signed)
Called about CT patient has pulmonary nodule will need CT chest in 3 mo to follow this nodule. Advised patient.

## 2013-06-09 ENCOUNTER — Other Ambulatory Visit: Payer: 59

## 2013-06-15 ENCOUNTER — Ambulatory Visit (INDEPENDENT_AMBULATORY_CARE_PROVIDER_SITE_OTHER): Payer: 59 | Admitting: Family Medicine

## 2013-06-15 ENCOUNTER — Ambulatory Visit: Payer: 59

## 2013-06-15 VITALS — BP 132/84 | HR 72 | Temp 97.7°F | Resp 18 | Ht 70.0 in | Wt 195.0 lb

## 2013-06-15 DIAGNOSIS — K219 Gastro-esophageal reflux disease without esophagitis: Secondary | ICD-10-CM

## 2013-06-15 DIAGNOSIS — R079 Chest pain, unspecified: Secondary | ICD-10-CM

## 2013-06-15 LAB — POCT CBC
Granulocyte percent: 54.1 % (ref 37–80)
HCT, POC: 43.5 % (ref 43.5–53.7)
Hemoglobin: 13.8 g/dL — AB (ref 14.1–18.1)
Lymph, poc: 2.4 (ref 0.6–3.4)
MCH, POC: 29.1 pg (ref 27–31.2)
MCHC: 31.7 g/dL — AB (ref 31.8–35.4)
MCV: 91.5 fL (ref 80–97)
MID (cbc): 0.4 (ref 0–0.9)
MPV: 9.7 fL (ref 0–99.8)
POC Granulocyte: 3.2 (ref 2–6.9)
POC LYMPH PERCENT: 39.6 % (ref 10–50)
POC MID %: 6.3 %M (ref 0–12)
Platelet Count, POC: 96 10*3/uL — AB (ref 142–424)
RBC: 4.75 M/uL (ref 4.69–6.13)
RDW, POC: 13.1 %
WBC: 6 10*3/uL (ref 4.6–10.2)

## 2013-06-15 LAB — TROPONIN I: Troponin I: <0.01 ng/mL (ref <0.06)

## 2013-06-15 LAB — GLUCOSE, POCT (MANUAL RESULT ENTRY): POC Glucose: 230 mg/dL — AB (ref 70–99)

## 2013-06-15 NOTE — Patient Instructions (Signed)
Gastroesophageal Reflux Disease, Adult  Gastroesophageal reflux disease (GERD) happens when acid from your stomach flows up into the esophagus. When acid comes in contact with the esophagus, the acid causes soreness (inflammation) in the esophagus. Over time, GERD may create small holes (ulcers) in the lining of the esophagus.  CAUSES    Increased body weight. This puts pressure on the stomach, making acid rise from the stomach into the esophagus.   Smoking. This increases acid production in the stomach.   Drinking alcohol. This causes decreased pressure in the lower esophageal sphincter (valve or ring of muscle between the esophagus and stomach), allowing acid from the stomach into the esophagus.   Late evening meals and a full stomach. This increases pressure and acid production in the stomach.   A malformed lower esophageal sphincter.  Sometimes, no cause is found.  SYMPTOMS    Burning pain in the lower part of the mid-chest behind the breastbone and in the mid-stomach area. This may occur twice a week or more often.   Trouble swallowing.   Sore throat.   Dry cough.   Asthma-like symptoms including chest tightness, shortness of breath, or wheezing.  DIAGNOSIS   Your caregiver may be able to diagnose GERD based on your symptoms. In some cases, X-rays and other tests may be done to check for complications or to check the condition of your stomach and esophagus.  TREATMENT   Your caregiver may recommend over-the-counter or prescription medicines to help decrease acid production. Ask your caregiver before starting or adding any new medicines.   HOME CARE INSTRUCTIONS    Change the factors that you can control. Ask your caregiver for guidance concerning weight loss, quitting smoking, and alcohol consumption.   Avoid foods and drinks that make your symptoms worse, such as:   Caffeine or alcoholic drinks.   Chocolate.   Peppermint or mint flavorings.   Garlic and onions.   Spicy foods.   Citrus fruits,  such as oranges, lemons, or limes.   Tomato-based foods such as sauce, chili, salsa, and pizza.   Fried and fatty foods.   Avoid lying down for the 3 hours prior to your bedtime or prior to taking a nap.   Eat small, frequent meals instead of large meals.   Wear loose-fitting clothing. Do not wear anything tight around your waist that causes pressure on your stomach.   Raise the head of your bed 6 to 8 inches with wood blocks to help you sleep. Extra pillows will not help.   Only take over-the-counter or prescription medicines for pain, discomfort, or fever as directed by your caregiver.   Do not take aspirin, ibuprofen, or other nonsteroidal anti-inflammatory drugs (NSAIDs).  SEEK IMMEDIATE MEDICAL CARE IF:    You have pain in your arms, neck, jaw, teeth, or back.   Your pain increases or changes in intensity or duration.   You develop nausea, vomiting, or sweating (diaphoresis).   You develop shortness of breath, or you faint.   Your vomit is green, yellow, black, or looks like coffee grounds or blood.   Your stool is red, bloody, or black.  These symptoms could be signs of other problems, such as heart disease, gastric bleeding, or esophageal bleeding.  MAKE SURE YOU:    Understand these instructions.   Will watch your condition.   Will get help right away if you are not doing well or get worse.  Document Released: 07/29/2005 Document Revised: 01/11/2012 Document Reviewed: 05/08/2011  Exit[employer] Patient   Information 2014 [employer]  Chest Pain (Nonspecific)  It is often hard to give a specific diagnosis for the cause of chest pain. There is always a chance that your pain could be related to something serious, such as a heart attack or a blood clot in the lungs. You need to follow up with your caregiver for further evaluation.  CAUSES    Heartburn.   Pneumonia or bronchitis.   Anxiety or stress.   Inflammation around your heart (pericarditis) or lung (pleuritis or pleurisy).   A blood clot  in the lung.   A collapsed lung (pneumothorax). It can develop suddenly on its own (spontaneous pneumothorax) or from injury (trauma) to the chest.   Shingles infection (herpes zoster virus).  The chest wall is composed of bones, muscles, and cartilage. Any of these can be the source of the pain.   The bones can be bruised by injury.   The muscles or cartilage can be strained by coughing or overwork.   The cartilage can be affected by inflammation and become sore (costochondritis).  DIAGNOSIS   Lab tests or other studies, such as X-rays, electrocardiography, stress testing, or cardiac imaging, may be needed to find the cause of your pain.   TREATMENT    Treatment depends on what may be causing your chest pain. Treatment may include:   Acid blockers for heartburn.   Anti-inflammatory medicine.   Pain medicine for inflammatory conditions.   Antibiotics if an infection is present.   You may be advised to change lifestyle habits. This includes stopping smoking and avoiding alcohol, caffeine, and chocolate.   You may be advised to keep your head raised (elevated) when sleeping. This reduces the chance of acid going backward from your stomach into your esophagus.   Most of the time, nonspecific chest pain will improve within 2 to 3 days with rest and mild pain medicine.  HOME CARE INSTRUCTIONS    If antibiotics were prescribed, take your antibiotics as directed. Finish them even if you start to feel better.   For the next few days, avoid physical activities that bring on chest pain. Continue physical activities as directed.   Do not smoke.   Avoid drinking alcohol.   Only take over-the-counter or prescription medicine for pain, discomfort, or fever as directed by your caregiver.   Follow your caregiver's suggestions for further testing if your chest pain does not go away.   Keep any follow-up appointments you made. If you do not go to an appointment, you could develop lasting (chronic) problems with  pain. If there is any problem keeping an appointment, you must call to reschedule.  SEEK MEDICAL CARE IF:    You think you are having problems from the medicine you are taking. Read your medicine instructions carefully.   Your chest pain does not go away, even after treatment.   You develop a rash with blisters on your chest.  SEEK IMMEDIATE MEDICAL CARE IF:    You have increased chest pain or pain that spreads to your arm, neck, jaw, back, or abdomen.   You develop shortness of breath, an increasing cough, or you are coughing up blood.   You have severe back or abdominal pain, feel nauseous, or vomit.   You develop severe weakness, fainting, or chills.   You have a fever.  THIS IS AN EMERGENCY. Do not wait to see if the pain will go away. Get medical help at once. Call your local emergency services (911 in [place]   Do not drive yourself to the hospital.  MAKE SURE YOU:    Understand these instructions.   Will watch your condition.   Will get help right away if you are not doing well or get worse.  Document Released: 07/29/2005 Document Revised: 01/11/2012 Document Reviewed: 05/24/2008  Exit[employer] Patient Information 2014 [employer]

## 2013-06-15 NOTE — Progress Notes (Signed)
[facility]:  Office Visit  Chief Complaint:  Chief Complaint  Patient presents with  . Chest Pain    started this morning     HPI: Jesse Wilcox is a 62 y.o. male who complains of here for localized right sided anterior chest pain. He is a smoker, has DM ( last HbA1c was 8.6), HTN, denies XOL.  Pain is intermittent, he woke up with it, He ate a can of pinto beans last night right before he went to bed so that he could take his insulin medication.  The pain is localizeed to right anterior chest wall. It is intermittent, throbbing/aching, lasts for a few minutes and then resolves. 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 Denies any other sxs ie HA, vision changes, diaphoresis, numbnes/tingling, jaw pain, weakness, n/v/abd pain, dizziness, gait changes. He takes a baby asa a day. He denies any URI sxs, cough, he denies any swellinging of his feets, fevers, chills.  He has TUMS at home and took some and now is symptom free  Past Medical History  Diagnosis Date  . Chronic hepatitis C   . Diabetes mellitus   . Carotid artery occlusion    Past Surgical History  Procedure Laterality Date  . Aortic arch angiogram  04-15-12    By Dr. Jacinto Halim   History   Social History  . Marital Status: Single    Spouse Name: N/A    Number of Children: N/A  . Years of Education: N/A   Social History Main Topics  . Smoking status: Current Every Day Smoker -- 0.50 packs/day for 42 years    Types: Cigarettes  . Smokeless tobacco: Never Used  . Alcohol Use: No  . Drug Use: No  . Sexual Activity: None   Other Topics Concern  . None   Social History Narrative  . None   Family History  Problem Relation Age of Onset  . Diabetes Mother   . Heart disease Mother   . Hypertension Mother   . Heart attack Mother   . Diabetes Father   . Heart disease Father   . Hypertension Father   . Diabetes  Sister   . Hyperlipidemia Sister   . Heart disease Sister   . Heart attack Sister   . Peripheral vascular disease Sister   . Hyperlipidemia Brother   . Hypertension Brother    No Known Allergies Prior to Admission medications   Medication Sig Start Date End Date Taking? Authorizing Provider  amLODipine (NORVASC) 10 MG tablet Take 1 tablet (10 mg total) by mouth daily. 02/06/13  Yes Collene Gobble, MD  aspirin EC 81 MG tablet Take 81 mg by mouth daily.   Yes Historical Provider, MD  benazepril-hydrochlorthiazide (LOTENSIN HCT) 20-12.5 MG per tablet Take 1 tablet by mouth daily. 02/06/13  Yes Collene Gobble, MD  clopidogrel (PLAVIX) 75 MG tablet Take 1 tablet (75 mg total) by mouth daily. 02/06/13  Yes Collene Gobble, MD  cyclobenzaprine (FLEXERIL) 5 MG tablet Take 0.5 tablets (2.5 mg total) by mouth at bedtime. 12/29/12  Yes Collene Gobble, MD  glucose blood (TRUETEST TEST) test strip USE AS DIRECTED. 02/06/13  Yes Collene Gobble, MD  insulin glargine (LANTUS SOLOSTAR) 100 UNIT/ML injection Please use 40 units at night and increase by 2 units every 3-4 nights to achieve a fasting blood sugar in the morning  between 120 and 140 02/06/13  Yes Collene Gobble, MD  Insulin Pen Needle 31G X 8 MM MISC Use as directed 02/06/13  Yes Collene Gobble, MD  ketoconazole (NIZORAL) 2 % cream Apply topically daily. 07/21/12  Yes Collene Gobble, MD  Multiple Vitamin (MULTIVITAMIN WITH MINERALS) TABS Take 1 tablet by mouth daily.   Yes Historical Provider, MD     ROS: The patient denies fevers, chills, night sweats, unintentional weight loss, palpitations, wheezing, dyspnea on exertion, nausea, vomiting, abdominal pain, dysuria, hematuria, melena, numbness, weakness, or tingling.   All other systems have been reviewed and were otherwise negative with the exception of those mentioned in the HPI and as above.    PHYSICAL EXAM: Filed Vitals:   06/15/13 1446  BP: 132/84  Pulse: 72  Temp: 97.7 F (36.5 C)  Resp: 18   Spo2  100% Filed Vitals:   06/15/13 1446  Height: 5\' 10"  (1.778 m)  Weight: 195 lb (88.451 kg)   Body mass index is 27.98 kg/(m^2).  General: Alert, no acute distress HEENT:  Normocephalic, atraumatic, oropharynx patent. EOMI, PERRLA, fundoscopic exam nl, TM nl Cardiovascular:  Regular rate and rhythm, no rubs murmurs or gallops.  No Carotid bruits, radial pulse intact. No pedal edema. + costochondral pain with palaption of right ant chest  Respiratory: Clear to auscultation bilaterally.  No wheezes, rales, or rhonchi.  No cyanosis, no use of accessory musculature GI: No organomegaly, abdomen is soft and non-tender, positive bowel sounds.  No masses. Skin: No rashes. Neurologic: Facial musculature symmetric. Psychiatric: Patient is appropriate throughout our interaction. Lymphatic: No cervical lymphadenopathy Musculoskeletal: Gait intact.   LABS: Results for orders placed in visit on 06/15/13  POCT CBC      Result Value Range   WBC 6.0  4.6 - 10.2 K/uL   Lymph, poc 2.4  0.6 - 3.4   POC LYMPH PERCENT 39.6  10 - 50 %L   MID (cbc) 0.4  0 - 0.9   POC MID % 6.3  0 - 12 %M   POC Granulocyte 3.2  2 - 6.9   Granulocyte percent 54.1  37 - 80 %G   RBC 4.75  4.69 - 6.13 M/uL   Hemoglobin 13.8 (*) 14.1 - 18.1 g/dL   HCT, POC 16.1  09.6 - 53.7 %   MCV 91.5  80 - 97 fL   MCH, POC 29.1  27 - 31.2 pg   MCHC 31.7 (*) 31.8 - 35.4 g/dL   RDW, POC 04.5     Platelet Count, POC 96 (*) 142 - 424 K/uL   MPV 9.7  0 - 99.8 fL  GLUCOSE, POCT (MANUAL RESULT ENTRY)      Result Value Range   POC Glucose 230 (*) 70 - 99 mg/dl     EKG/XRAY:   Primary read interpreted by Dr. Conley Rolls at [facility]. No acute cardiopulmonary process   ASSESSMENT/PLAN: Encounter Diagnoses  Name Primary?  . Chest pain, unspecified Yes  . GERD (gastroesophageal reflux disease)    Currenlty no CP sxs Troponin I pending ( I will call with troponin results, he agrees to go straight to ER if elevated) PPI  Or TUMs and GERD  precautions given Declined GI cocktail here F/u prn or go to ER for worsening sxs He has slight anemai on exam, no of great significance, will recheck anemia during visit for diabetes.    Kyrell Ruacho PHUONG, DO 06/15/2013 4:18 PM   06/15/13 @ 6:30-troponin was negative. Patient  has taken TUMS he is feeling fine. Advise that if he has CP with exertion or sxs change he needs to go to ED.

## 2013-06-16 LAB — COMPREHENSIVE METABOLIC PANEL WITH GFR
Albumin: 3.6 g/dL (ref 3.5–5.2)
BUN: 15 mg/dL (ref 6–23)
CO2: 29 meq/L (ref 19–32)
Calcium: 8.8 mg/dL (ref 8.4–10.5)
Chloride: 101 meq/L (ref 96–112)
Glucose, Bld: 222 mg/dL — ABNORMAL HIGH (ref 70–99)
Potassium: 3.8 meq/L (ref 3.5–5.3)
Sodium: 133 meq/L — ABNORMAL LOW (ref 135–145)
Total Protein: 6.5 g/dL (ref 6.0–8.3)

## 2013-06-16 LAB — COMPREHENSIVE METABOLIC PANEL
ALT: 148 U/L — ABNORMAL HIGH (ref 0–53)
AST: 103 U/L — ABNORMAL HIGH (ref 0–37)
Alkaline Phosphatase: 110 U/L (ref 39–117)
Creat: 0.77 mg/dL (ref 0.50–1.35)
Total Bilirubin: 0.6 mg/dL (ref 0.3–1.2)

## 2013-07-04 ENCOUNTER — Telehealth: Payer: Self-pay | Admitting: Family Medicine

## 2013-07-04 NOTE — Telephone Encounter (Signed)
Spoke with Mr Jesse Wilcox, checking up on him, we discussed his labs again, he told me he has been to his hepatitis doctor but he cannot give me the name of his hepatitis doctor. Additionally he has gotten a "scan" that was not available in [place] and only at [place] last week . I am not sure what is going on but he will ask his sister to call me .  He has a history of hep C with coma.   The last telephone note or note about hepatitis clinic is from 08/2012 from Dr. Brooke Dare, see below:  Labs from 07/22/12, received. It should be noted that [name] was a no show to his hepatology appointment on 04/07/12.  The [facility] office in [place] is now closed. I will ask that [name] be contacted by our [facility] at [facility] office to make arrangements for [name] to be seen in our new [place] office.  All future correspondence regarding [name] or other patients should be faxed to [phone].

## 2013-07-27 ENCOUNTER — Encounter: Payer: Self-pay | Admitting: Family Medicine

## 2013-09-13 ENCOUNTER — Other Ambulatory Visit: Payer: Self-pay | Admitting: Gastroenterology

## 2013-09-13 DIAGNOSIS — R911 Solitary pulmonary nodule: Secondary | ICD-10-CM

## 2013-09-22 ENCOUNTER — Ambulatory Visit (INDEPENDENT_AMBULATORY_CARE_PROVIDER_SITE_OTHER): Payer: 59 | Admitting: Emergency Medicine

## 2013-09-22 VITALS — BP 124/70 | HR 76 | Temp 97.4°F | Resp 18 | Wt 195.0 lb

## 2013-09-22 DIAGNOSIS — S40029A Contusion of unspecified upper arm, initial encounter: Secondary | ICD-10-CM

## 2013-09-22 DIAGNOSIS — B1920 Unspecified viral hepatitis C without hepatic coma: Secondary | ICD-10-CM

## 2013-09-22 DIAGNOSIS — L609 Nail disorder, unspecified: Secondary | ICD-10-CM

## 2013-09-22 DIAGNOSIS — S40022A Contusion of left upper arm, initial encounter: Secondary | ICD-10-CM

## 2013-09-22 DIAGNOSIS — E119 Type 2 diabetes mellitus without complications: Secondary | ICD-10-CM

## 2013-09-22 LAB — COMPREHENSIVE METABOLIC PANEL
AST: 97 U/L — ABNORMAL HIGH (ref 0–37)
BUN: 16 mg/dL (ref 6–23)
Calcium: 8.6 mg/dL (ref 8.4–10.5)
Chloride: 102 mEq/L (ref 96–112)
Creat: 0.78 mg/dL (ref 0.50–1.35)

## 2013-09-22 LAB — POCT CBC
HCT, POC: 45.2 % (ref 43.5–53.7)
Lymph, poc: 3.4 (ref 0.6–3.4)
MCHC: 31.6 g/dL — AB (ref 31.8–35.4)
POC Granulocyte: 3.8 (ref 2–6.9)
POC LYMPH PERCENT: 44.7 %L (ref 10–50)
POC MID %: 6.1 %M (ref 0–12)
RDW, POC: 14.1 %

## 2013-09-22 LAB — GLUCOSE, POCT (MANUAL RESULT ENTRY): POC Glucose: 160 mg/dl — AB (ref 70–99)

## 2013-09-22 LAB — PROTIME-INR: Prothrombin Time: 14 seconds (ref 11.6–15.2)

## 2013-09-22 NOTE — Progress Notes (Addendum)
This chart was scribed for Jesse Chris, MD by Joaquin Music, ED Scribe. This patient was seen in room Room/bed 12 and the patient's care was started at 12:31 PM. Subjective:    Patient ID: Jesse Wilcox, male    DOB: 1951-07-18, 63 y.o.   MRN: [id number] Chief Complaint  Patient presents with  . left arm swollen and bruised    since yesterday   . dm check   HPI Jesse Wilcox is a 62 y.o. Male with a hx of Hep C and DM who presents to the [facility] complaining of L forearm swelling and bruising that began 24 hours ago. Pt states he woke up this morning and noticed the bruising has worsening. He states he has started his new Hep C tx, Harvoni 90mg  1xday for 3 months prescribed by Dr. Dierdre Harness. He states he has only taken the medication twice. Pt is unsure if he is having an allergic rx or an injury.  Pt also complains of having overgrown L toe nails.  Pt states he will be retiring January 2015. He has been employed with [employer] for 14 years.  Patient Active Problem List   Diagnosis Date Noted  . Hep C w/o coma, chronic 10/22/2012  . SOB (shortness of breath) 08/03/2012  . Occlusion and stenosis of carotid artery without mention of cerebral infarction 04/15/2012  . DM type 2 (diabetes mellitus, type 2) 02/10/2012   Review of Systems  All other systems reviewed and are negative.   Objective:   Physical Exam CONSTITUTIONAL: Well developed/well nourished HEAD: Normocephalic/atraumatic EYES: EOMI/PERRL ENMT: Mucous membranes moist NECK: supple no meningeal signs SPINE:entire spine nontender CV: S1/S2 noted, no murmurs/rubs/gallops noted LUNGS: Lungs are clear to auscultation bilaterally, no apparent distress ABDOMEN: soft, nontender, no rebound or guarding GU:no cva tenderness NEURO: Pt is awake/alert, moves all extremitiesx4 EXTREMITIES: pulses normal, full ROM patient has significant dystrophic nails involving the left foot SKIN: warm, color normal there is  a 2 by 2 inch bruise proximal left forearm PSYCH: no abnormalities of mood noted Results for orders placed in visit on 09/22/13  POCT CBC      Result Value Range   WBC 7.7  4.6 - 10.2 K/uL   Lymph, poc 3.4  0.6 - 3.4   POC LYMPH PERCENT 44.7  10 - 50 %L   MID (cbc) 0.5  0 - 0.9   POC MID % 6.1  0 - 12 %M   POC Granulocyte 3.8  2 - 6.9   Granulocyte percent 49.2  37 - 80 %G   RBC 4.93  4.69 - 6.13 M/uL   Hemoglobin 14.3  14.1 - 18.1 g/dL   HCT, POC 78.2  95.6 - 53.7 %   MCV 91.6  80 - 97 fL   MCH, POC 29.0  27 - 31.2 pg   MCHC 31.6 (*) 31.8 - 35.4 g/dL   RDW, POC 21.3     Platelet Count, POC 106 (*) 142 - 424 K/uL   MPV 10.3  0 - 99.8 fL  GLUCOSE, POCT (MANUAL RESULT ENTRY)      Result Value Range   POC Glucose 160 (*) 70 - 99 mg/dl  POCT GLYCOSYLATED HEMOGLOBIN (HGB A1C)      Result Value Range   Hemoglobin A1C 7.7        Triage Vitals:BP 124/70  Pulse 76  Temp(Src) 97.4 F (36.3 C)  Resp 18  Wt 195 lb (88.451 kg)  SpO2 95% Assessment & Plan:  I personally performed the services described in this documentation, which was scribed in my presence. The recorded information has been reviewed and is accurate.

## 2014-01-22 ENCOUNTER — Other Ambulatory Visit: Payer: Self-pay | Admitting: Internal Medicine

## 2014-01-22 DIAGNOSIS — C220 Liver cell carcinoma: Secondary | ICD-10-CM

## 2014-02-05 ENCOUNTER — Ambulatory Visit
Admission: RE | Admit: 2014-02-05 | Discharge: 2014-02-05 | Disposition: A | Discharge status: Home / Self Care | Payer: 59 | Source: Ambulatory Visit | Attending: Internal Medicine | Admitting: Internal Medicine

## 2014-02-05 DIAGNOSIS — C220 Liver cell carcinoma: Secondary | ICD-10-CM

## 2014-02-07 ENCOUNTER — Other Ambulatory Visit: Payer: Self-pay | Admitting: Emergency Medicine

## 2014-03-16 ENCOUNTER — Ambulatory Visit (INDEPENDENT_AMBULATORY_CARE_PROVIDER_SITE_OTHER): Payer: 59 | Admitting: Family Medicine

## 2014-03-16 VITALS — BP 130/76 | HR 66 | Temp 97.6°F | Resp 16 | Ht 70.0 in | Wt 198.8 lb

## 2014-03-16 DIAGNOSIS — M62838 Other muscle spasm: Secondary | ICD-10-CM

## 2014-03-16 DIAGNOSIS — I10 Essential (primary) hypertension: Secondary | ICD-10-CM

## 2014-03-16 DIAGNOSIS — L603 Nail dystrophy: Secondary | ICD-10-CM

## 2014-03-16 DIAGNOSIS — N481 Balanitis: Secondary | ICD-10-CM

## 2014-03-16 DIAGNOSIS — E119 Type 2 diabetes mellitus without complications: Secondary | ICD-10-CM

## 2014-03-16 DIAGNOSIS — I2510 Atherosclerotic heart disease of native coronary artery without angina pectoris: Secondary | ICD-10-CM

## 2014-03-16 LAB — COMPREHENSIVE METABOLIC PANEL
ALT: 41 U/L (ref 0–53)
AST: 30 U/L (ref 0–37)
Albumin: 4.3 g/dL (ref 3.5–5.2)
Alkaline Phosphatase: 100 U/L (ref 39–117)
BUN: 12 mg/dL (ref 6–23)
CO2: 26 mEq/L (ref 19–32)
Calcium: 10.1 mg/dL (ref 8.4–10.5)
Chloride: 98 mEq/L (ref 96–112)
Creat: 0.85 mg/dL (ref 0.50–1.35)
Glucose, Bld: 145 mg/dL — ABNORMAL HIGH (ref 70–99)
Potassium: 4.2 mEq/L (ref 3.5–5.3)
Sodium: 135 mEq/L (ref 135–145)
Total Bilirubin: 1 mg/dL (ref 0.2–1.2)
Total Protein: 7.9 g/dL (ref 6.0–8.3)

## 2014-03-16 LAB — POCT GLYCOSYLATED HEMOGLOBIN (HGB A1C): Hemoglobin A1C: 9.1

## 2014-03-16 MED ORDER — INSULIN GLARGINE 100 UNIT/ML SOLOSTAR PEN: PEN_INJECTOR | SUBCUTANEOUS | Status: DC

## 2014-03-16 MED ORDER — CYCLOBENZAPRINE HCL 5 MG PO TABS: 2.5000 mg | ORAL_TABLET | Freq: Every day | ORAL | Status: DC

## 2014-03-16 MED ORDER — CLOPIDOGREL BISULFATE 75 MG PO TABS: 75.0000 mg | ORAL_TABLET | Freq: Every day | ORAL | Status: DC

## 2014-03-16 MED ORDER — BENAZEPRIL-HYDROCHLOROTHIAZIDE 20-12.5 MG PO TABS: 1.0000 | ORAL_TABLET | Freq: Every day | ORAL | Status: DC

## 2014-03-16 MED ORDER — GLUCOSE BLOOD VI STRP: ORAL_STRIP | Status: DC

## 2014-03-16 MED ORDER — AMLODIPINE BESYLATE 10 MG PO TABS: 10.0000 mg | ORAL_TABLET | Freq: Every day | ORAL | Status: DC

## 2014-03-16 MED ORDER — INSULIN PEN NEEDLE 31G X 8 MM MISC: Status: DC

## 2014-03-16 MED ORDER — KETOCONAZOLE 2 % EX CREA: TOPICAL_CREAM | Freq: Every day | CUTANEOUS | Status: DC

## 2014-03-16 NOTE — Patient Instructions (Signed)
Diabetes and Foot Care Diabetes may cause you to have problems because of poor blood supply (circulation) to your feet and legs. This may cause the skin on your feet to become thinner, break easier, and heal more slowly. Your skin may become dry, and the skin may peel and crack. You may also have nerve damage in your legs and feet causing decreased feeling in them. You may not notice minor injuries to your feet that could lead to infections or more serious problems. Taking care of your feet is one of the most important things you can do for yourself.  HOME CARE INSTRUCTIONS  Wear shoes at all times, even in the house. Do not go barefoot. Bare feet are easily injured.  Check your feet daily for blisters, cuts, and redness. If you cannot see the bottom of your feet, use a mirror or ask someone for help.  Wash your feet with warm water (do not use hot water) and mild soap. Then pat your feet and the areas between your toes until they are completely dry. Do not soak your feet as this can dry your skin.  Apply a moisturizing lotion or petroleum jelly (that does not contain alcohol and is unscented) to the skin on your feet and to dry, brittle toenails. Do not apply lotion between your toes.  Trim your toenails straight across. Do not dig under them or around the cuticle. File the edges of your nails with an emery board or nail file.  Do not cut corns or calluses or try to remove them with medicine.  Wear clean socks or stockings every day. Make sure they are not too tight. Do not wear knee-high stockings since they may decrease blood flow to your legs.  Wear shoes that fit properly and have enough cushioning. To break in new shoes, wear them for just a few hours a day. This prevents you from injuring your feet. Always look in your shoes before you put them on to be sure there are no objects inside.  Do not cross your legs. This may decrease the blood flow to your feet.  If you find a minor scrape,  cut, or break in the skin on your feet, keep it and the skin around it clean and dry. These areas may be cleansed with mild soap and water. Do not cleanse the area with peroxide, alcohol, or iodine.  When you remove an adhesive bandage, be sure not to damage the skin around it.  If you have a wound, look at it several times a day to make sure it is healing.  Do not use heating pads or hot water bottles. They may burn your skin. If you have lost feeling in your feet or legs, you may not know it is happening until it is too late.  Make sure your health care provider performs a complete foot exam at least annually or more often if you have foot problems. Report any cuts, sores, or bruises to your health care provider immediately. SEEK MEDICAL CARE IF:   You have an injury that is not healing.  You have cuts or breaks in the skin.  You have an ingrown nail.  You notice redness on your legs or feet.  You feel burning or tingling in your legs or feet.  You have pain or cramps in your legs and feet.  Your legs or feet are numb.  Your feet always feel cold. SEEK IMMEDIATE MEDICAL CARE IF:   There is increasing redness,   swelling, or pain in or around a wound.  There is a red line that goes up your leg.  Pus is coming from a wound.  You develop a fever or as directed by your health care provider.  You notice a bad smell coming from an ulcer or wound. Document Released: 10/16/2000 Document Revised: 06/21/2013 Document Reviewed: 03/28/2013 Exit[employer] Patient Information 2014 [employer].  

## 2014-03-16 NOTE — Progress Notes (Signed)
Subjective:    Patient ID: Jesse Wilcox, male    DOB: 1951-04-09, 63 y.o.   MRN: [id number]  HPI Chief Complaint  Patient presents with   Medication Refill    on all meds    This chart was scribed for Robyn Haber, MD by Thea Alken, ED Scribe. This patient was seen in room 5 and the patient's care was started at 2:39 PM.  HPI Comments: Jesse Wilcox is a 63 y.o. male who presents to the [facility] here for medication refill. Pt reports reports he has been using insulin for a while. He reports that his dosage as increased from 40 units to 44 units. Pt reports his health is very well.  He reports he is usually seen by Dr. Everlene Farrier. He reports being seen by a podiatrist regarding toe fungus but has been able to f/u with them. Pt reports he is a smoker. 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   Pt reports he is retired and used to work at the [employer].  Past Medical History  Diagnosis Date   Chronic hepatitis C    Diabetes mellitus    Carotid artery occlusion    No Known Allergies Prior to Admission medications   Medication Sig Start Date End Date Taking? Authorizing Provider  amLODipine (NORVASC) 10 MG tablet Take 1 tablet (10 mg total) by mouth daily. 02/06/13  Yes Darlyne Russian, MD  aspirin EC 81 MG tablet Take 81 mg by mouth daily.   Yes Historical Provider, MD  benazepril-hydrochlorthiazide (LOTENSIN HCT) 20-12.5 MG per tablet Take 1 tablet by mouth daily. 02/06/13  Yes Darlyne Russian, MD  clopidogrel (PLAVIX) 75 MG tablet Take 1 tablet (75 mg total) by mouth daily. 02/06/13  Yes Darlyne Russian, MD  cyclobenzaprine (FLEXERIL) 5 MG tablet Take 0.5 tablets (2.5 mg total) by mouth at bedtime. 12/29/12  Yes Darlyne Russian, MD  glucose blood (TRUETEST TEST) test strip USE AS DIRECTED. 02/06/13  Yes Darlyne Russian, MD  Insulin Pen Needle 31G X 8 MM MISC Use as directed 02/06/13  Yes Darlyne Russian, MD  ketoconazole  (NIZORAL) 2 % cream Apply topically daily. 07/21/12  Yes Darlyne Russian, MD  LANTUS SOLOSTAR 100 UNIT/ML Solostar Pen USE 40 UNITS AT NIGHT AND INCREASE BY 2 UNITS EVERY 3-4 NIGHTS TO GET A FASTING SUGAR IN THE AM BETWEEN 120 AND 140 02/07/14  Yes Darlyne Russian, MD  Multiple Vitamin (MULTIVITAMIN WITH MINERALS) TABS Take 1 tablet by mouth daily.   Yes Historical Provider, MD   Review of Systems  Constitutional: Negative for fever and chills.      Objective:   Physical Exam  Nursing note and vitals reviewed. Constitutional: He is oriented to person, place, and time. He appears well-developed and well-nourished. No distress.  HENT:  Head: Normocephalic and atraumatic.  Eyes: EOM are normal.  Neck: Neck supple. Carotid bruit is not present.  Cardiovascular: Normal rate, regular rhythm and normal heart sounds.  Exam reveals no gallop and no friction rub.   No murmur heard. Pulmonary/Chest: Effort normal and breath sounds normal. No respiratory distress. He has no wheezes. He has no rales. He exhibits no tenderness.  Musculoskeletal: Normal range of motion.  Neurological: He is alert and oriented to person, place, and time.  Diminished pulses in bilateral feet. Normal sensation to monofilaments in feet.  Skin: Skin is warm and dry.  Thickened crusty toe nails  Psychiatric: He has a normal mood and affect. His behavior is normal.   Results for orders placed in visit on 03/16/14  POCT GLYCOSYLATED HEMOGLOBIN (HGB A1C)      Result Value Ref Range   Hemoglobin A1C 9.1         Assessment & Plan:   1. Diabetes   2. Hypertension   3. ASCVD (arteriosclerotic cardiovascular disease)    Meds ordered this encounter  Medications   Insulin Pen Needle 31G X 8 MM MISC    Sig: Use as directed    Dispense:  100 each    Refill:  10   glucose blood (TRUETEST TEST) test strip    Sig: USE AS DIRECTED.    Dispense:  100 each    Refill:  PRN   benazepril-hydrochlorthiazide (LOTENSIN HCT) 20-12.5  MG per tablet    Sig: Take 1 tablet by mouth daily.    Dispense:  90 tablet    Refill:  3   amLODipine (NORVASC) 10 MG tablet    Sig: Take 1 tablet (10 mg total) by mouth daily.    Dispense:  90 tablet    Refill:  3   Insulin Glargine (LANTUS SOLOSTAR) 100 UNIT/ML Solostar Pen    Sig: 44 units daily    Dispense:  15 mL    Refill:  11   clopidogrel (PLAVIX) 75 MG tablet    Sig: Take 1 tablet (75 mg total) by mouth daily.    Dispense:  90 tablet    Refill:  3    Recheck 3 months  Robyn Haber, MD

## 2014-03-16 NOTE — Addendum Note (Signed)
Addended by: Robyn Haber on: 03/16/2014 03:23 PM   Modules accepted: Orders

## 2014-03-17 LAB — MICROALBUMIN, URINE: Microalb, Ur: 3.3 mg/dL — ABNORMAL HIGH (ref 0.00–1.89)

## 2014-03-27 ENCOUNTER — Ambulatory Visit: Payer: 59

## 2014-06-23 ENCOUNTER — Ambulatory Visit (INDEPENDENT_AMBULATORY_CARE_PROVIDER_SITE_OTHER): Payer: 59 | Admitting: Emergency Medicine

## 2014-06-23 VITALS — BP 126/66 | HR 86 | Temp 97.2°F | Resp 16 | Ht 71.0 in | Wt 200.4 lb

## 2014-06-23 DIAGNOSIS — E119 Type 2 diabetes mellitus without complications: Secondary | ICD-10-CM

## 2014-06-23 LAB — POCT GLYCOSYLATED HEMOGLOBIN (HGB A1C): HEMOGLOBIN A1C: 13.6

## 2014-06-23 LAB — GLUCOSE, POCT (MANUAL RESULT ENTRY): POC GLUCOSE: 165 mg/dL — AB (ref 70–99)

## 2014-06-23 NOTE — Progress Notes (Addendum)
Subjective:  This chart was scribed for Arlyss Queen, MD by Donato Schultz, Medical Scribe. This patient was seen in Room 13 and the patient's care was started at 9:47 AM.   Patient ID: Jesse Wilcox, male    DOB: 1951-01-24, 63 y.o.   MRN: [id number]  HPI HPI Comments: Jesse Wilcox is a 63 y.o. male with a history of DM and Hepatitis C who presents to the [facility] for a check-up.  His sugars have been good.  He completed his Hep C medication and last time he had his blood drawn there were no traces of Hep C.  He goes to the clinic every 6 months.He is still smoking.   Past Medical History  Diagnosis Date  . Chronic hepatitis C   . Diabetes mellitus   . Carotid artery occlusion    Past Surgical History  Procedure Laterality Date  . Aortic arch angiogram  04-15-12    By Dr. Einar Gip   Family History  Problem Relation Age of Onset  . Diabetes Mother   . Heart disease Mother   . Hypertension Mother   . Heart attack Mother   . Diabetes Father   . Heart disease Father   . Hypertension Father   . Diabetes Sister   . Hyperlipidemia Sister   . Heart disease Sister   . Heart attack Sister   . Peripheral vascular disease Sister   . Hyperlipidemia Brother   . Hypertension Brother    History   Social History  . Marital Status: Single    Spouse Name: N/A    Number of Children: N/A  . Years of Education: N/A   Occupational History  . Not on file.   Social History Main Topics  . Smoking status: Current Every Day Smoker -- 0.50 packs/day for 42 years    Types: Cigarettes  . Smokeless tobacco: Never Used  . Alcohol Use: No  . Drug Use: No  . Sexual Activity: Not on file   Other Topics Concern  . Not on file   Social History Narrative  . No narrative on file   No Known Allergies  Review of Systems   Objective:  Physical Exam  Nursing note and vitals reviewed. Constitutional: He is oriented to person, place, and time. He appears  well-developed and well-nourished.  HENT:  Head: Normocephalic and atraumatic.  Eyes: EOM are normal.  Neck: Normal range of motion.  Cardiovascular: Normal rate, regular rhythm and normal heart sounds.  Exam reveals no gallop and no friction rub.   No murmur heard. Pulmonary/Chest: Effort normal and breath sounds normal. No respiratory distress. He has no wheezes. He has no rales.  Musculoskeletal: Normal range of motion.  Neurological: He is alert and oriented to person, place, and time.  Skin: Skin is warm and dry.  Psychiatric: He has a normal mood and affect. His behavior is normal.   Results for orders placed in visit on 06/23/14  GLUCOSE, POCT (MANUAL RESULT ENTRY)      Result Value Ref Range   POC Glucose 165 (*) 70 - 99 mg/dl  POCT GLYCOSYLATED HEMOGLOBIN (HGB A1C)      Result Value Ref Range   Hemoglobin A1C 13.6      BP 126/66  Pulse 86  Temp(Src) 97.2 F (36.2 C) (Oral)  Resp 16  Ht 5\' 11"  (1.803 m)  Wt 200 lb 6.4 oz (90.901 kg)  BMI 27.96 kg/m2  SpO2 96% Assessment & Plan:  Pt has not been taking his insulin or checking his sugars regular. Referral will be made to Dr. Loanne Drilling for management. Pt advised to quit smoking. Follow up in three months. He will increase his Lantus by 2 units every 2-3 nights trying to get his a.m. blood sugar between 120 and 142   I personally performed the services described in this documentation, which was scribed in my presence. The recorded information has been reviewed and is accurate.

## 2014-06-23 NOTE — Addendum Note (Signed)
Addended by: Arlyss Queen A on: 06/23/2014 10:26 AM   Modules accepted: Orders

## 2014-07-17 ENCOUNTER — Telehealth: Payer: Self-pay | Admitting: Endocrinology

## 2014-07-17 ENCOUNTER — Encounter: Payer: 59 | Attending: Endocrinology | Admitting: Nutrition

## 2014-07-17 ENCOUNTER — Ambulatory Visit (INDEPENDENT_AMBULATORY_CARE_PROVIDER_SITE_OTHER): Payer: 59 | Admitting: Endocrinology

## 2014-07-17 ENCOUNTER — Encounter: Payer: Self-pay | Admitting: Endocrinology

## 2014-07-17 ENCOUNTER — Other Ambulatory Visit: Payer: Self-pay | Admitting: *Deleted

## 2014-07-17 VITALS — BP 118/66 | HR 80 | Temp 98.4°F | Resp 16 | Ht 71.0 in | Wt 205.8 lb

## 2014-07-17 DIAGNOSIS — Z794 Long term (current) use of insulin: Secondary | ICD-10-CM | POA: Diagnosis not present

## 2014-07-17 DIAGNOSIS — E119 Type 2 diabetes mellitus without complications: Secondary | ICD-10-CM | POA: Diagnosis present

## 2014-07-17 DIAGNOSIS — IMO0002 Reserved for concepts with insufficient information to code with codable children: Secondary | ICD-10-CM

## 2014-07-17 DIAGNOSIS — Z713 Dietary counseling and surveillance: Secondary | ICD-10-CM | POA: Diagnosis not present

## 2014-07-17 DIAGNOSIS — I10 Essential (primary) hypertension: Secondary | ICD-10-CM

## 2014-07-17 DIAGNOSIS — E1165 Type 2 diabetes mellitus with hyperglycemia: Secondary | ICD-10-CM

## 2014-07-17 DIAGNOSIS — E1169 Type 2 diabetes mellitus with other specified complication: Secondary | ICD-10-CM

## 2014-07-17 LAB — GLUCOSE, POCT (MANUAL RESULT ENTRY): POC GLUCOSE: 272 mg/dL — AB (ref 70–99)

## 2014-07-17 MED ORDER — INSULIN ASPART 100 UNIT/ML FLEXPEN: 8.0000 [IU] | PEN_INJECTOR | Freq: Three times a day (TID) | SUBCUTANEOUS | Status: DC

## 2014-07-17 MED ORDER — GLUCOSE BLOOD VI STRP: ORAL_STRIP | Status: DC

## 2014-07-17 MED ORDER — METFORMIN HCL ER 500 MG PO TB24: 1500.0000 mg | ORAL_TABLET | Freq: Every day | ORAL | Status: DC

## 2014-07-17 MED ORDER — ACCU-CHEK SOFTCLIX LANCETS MISC: Status: DC

## 2014-07-17 NOTE — Patient Instructions (Addendum)
Take Novolog insulin--8u,  5-10 min. Before each meal.   Test blood sugars 2 hours after one meal each day.

## 2014-07-17 NOTE — Progress Notes (Signed)
Patient ID: Jesse Wilcox, male   DOB: [birth year], 63 y.o.   MRN: [id number]            Reason for Appointment: Consultation for Type 2 Diabetes  Referring physician: Dr. Everlene Farrier  History of Present Illness:          Diagnosis: Type 2 diabetes mellitus, date of diagnosis:  ? 2010       Past history: He was initially diagnosed to have diabetes when he had weight loss. Not clear what his initial glucose was He was put on metformin and was continued on this until 02/2012 At that time he was changed to Lantus for unclear reasons, possibly because of his chronic hepatitis His A1c has been mostly over 7% since then  Recent history:  His blood sugar appears to be worsening over the last year More recently in 8/15 his blood sugar was markedly increased with A1c 13.6 He was checking his blood sugar somewhat sporadically and did have readings near 300 at times However he checks his blood sugar only in the morning Apparently he had been taking his Lantus very irregularly in the last several months for no apparent reason even though it is covered by his insurance With his high blood sugars he was having symptoms of increased thirst and urination and some weight loss  He was also not watching his diet but eating sweets and fried food regularly More recently he has taken his insulin and has increased his dose progressively but only based on his fasting readings He is now referred here for further management       Oral hypoglycemic drugs the patient is taking are:   none     Side effects from medications have been: None INSULIN regimen is described as: 48 units Lantus for 4-5 days   Compliance with the medical regimen: Poor Hypoglycemia: never   Glucose monitoring:  done one time a day         Glucometer: True Result  Blood Glucose readings recently on his meter are once 117-161 with average of 141 for 30 days, only checking in the mornings  Self-care: The diet that the patient has been  following is: Recently less fried food and sweets, occasionally drinking Pepsi    Meals: 3 meals per day. Breakfast is Eggs, toast, grits, bacon, usually eating at a restaurant in the morning         Exercise: None           Dietician visit, most recent: At diagnosis only              Weight history: Wt Readings from Last 3 Encounters:  07/17/14 205 lb 12.8 oz (93.35 kg)  06/23/14 200 lb 6.4 oz (90.901 kg)  03/16/14 198 lb 12.8 oz (90.175 kg)    Glycemic control:   Lab Results  Component Value Date   HGBA1C 13.6 06/23/2014   HGBA1C 9.1 03/16/2014   HGBA1C 7.7 09/22/2013   Lab Results  Component Value Date   MICROALBUR 3.30* 03/16/2014   CREATININE 0.85 03/16/2014         Medication List       This list is accurate as of: 07/17/14 10:08 AM.  Always use your most recent med list.               amLODipine 10 MG tablet  Commonly known as:  NORVASC  Take 1 tablet (10 mg total) by mouth daily.     aspirin EC 81 MG  tablet  Take 81 mg by mouth daily.     benazepril-hydrochlorthiazide 20-12.5 MG per tablet  Commonly known as:  LOTENSIN HCT  Take 1 tablet by mouth daily.     clopidogrel 75 MG tablet  Commonly known as:  PLAVIX  Take 1 tablet (75 mg total) by mouth daily.     cyclobenzaprine 5 MG tablet  Commonly known as:  FLEXERIL  Take 0.5 tablets (2.5 mg total) by mouth at bedtime.     glucose blood test strip  Commonly known as:  TRUETEST TEST  USE AS DIRECTED.     Insulin Glargine 100 UNIT/ML Solostar Pen  Commonly known as:  LANTUS SOLOSTAR  44 units daily     Insulin Pen Needle 31G X 8 MM Misc  Use as directed     ketoconazole 2 % cream  Commonly known as:  NIZORAL  Apply topically daily.     multivitamin with minerals Tabs tablet  Take 1 tablet by mouth daily.        Allergies: No Known Allergies  Past Medical History  Diagnosis Date  . Chronic hepatitis C   . Diabetes mellitus   . Carotid artery occlusion     Past Surgical History    Procedure Laterality Date  . Aortic arch angiogram  04-15-12    By Dr. Einar Gip    Family History  Problem Relation Age of Onset  . Diabetes Mother   . Heart disease Mother   . Hypertension Mother   . Heart attack Mother   . Diabetes Father   . Heart disease Father   . Hypertension Father   . Diabetes Sister   . Hyperlipidemia Sister   . Heart disease Sister   . Heart attack Sister   . Peripheral vascular disease Sister   . Hyperlipidemia Brother   . Hypertension Brother     Social History:  reports that he has been smoking Cigarettes.  He has a 21 pack-year smoking history. He has never used smokeless tobacco. He reports that he does not drink alcohol or use illicit drugs.    Review of Systems       Vision is normal. Most recent eye exam was 2014       Lipids: No levels available in his record       No results found for this basename: CHOL,  HDL,  LDLCALC,  LDLDIRECT,  TRIG,  CHOLHDL                  Skin: No rash or infections     Thyroid:  No  unusual fatigue or history of thyroid disease.     The blood pressure has been controlled with his regimen of amlodipine and Lotensin HCT     No swelling of feet.     No shortness of breath or chest tightness  on exertion.     Bowel habits: Normal.     He has been treated for hepatitis C      No joint  pains.          No history of Numbness, tingling or burning in feet    No history of CVA or TIA. Not clear how his diagnosis of carotid vascular disease was established, has had surgery and is on aspirin     LABS:  Office Visit on 07/17/2014  Component Date Value Ref Range Status  . POC Glucose 07/17/2014 272* 70 - 99 mg/dl Final    Physical Examination:  BP 118/66  Pulse 80  Temp(Src) 98.4 F (36.9 C)  Resp 16  Ht 5\' 11"  (1.803 m)  Wt 205 lb 12.8 oz (93.35 kg)  BMI 28.72 kg/m2  SpO2 96%  GENERAL: He  is averagely built and nourished, minimal obesity  HEENT:         Eye exam shows normal external  appearance. Fundus exam shows no retinopathy. Oral exam shows normal mucosa .  NECK:         General:  Neck exam shows no lymphadenopathy. Carotids are normal to palpation and no bruit heard.  Thyroid is just palpable on the right side, soft and no nodules felt.   LUNGS:         Chest is symmetrical. Lungs are clear to auscultation.Marland Kitchen   HEART:         Heart sounds:  S1 and S2 are normal. No murmurs or clicks heard., no S3 or S4.   ABDOMEN:   There is no distention present. Liver is enlarged about 3 fingerbreadths, firm, irregular and slightly tender in the right lateral upper quadrant; spleen not palpable. No other mass or tenderness present.  EXTREMITIES:     There is no edema. No skin lesions present.Marland Kitchen  NEUROLOGICAL:   Vibration sense is nearly normal in toes. Ankle jerks are absent bilaterally.biceps reflexes are normal            Diabetic foot exam shows normal monofilament sensation in the toes and plantar surfaces, no skin lesions or ulcers on the feet and normal pedal pulses  MUSCULOSKELETAL:       There is no enlargement or deformity of the joints. Spine is normal to inspection.Marland Kitchen   SKIN:       No rash or lesions of concern.        ASSESSMENT:  Diabetes type 2, uncontrolled  He has been treated with basal  insulin only since 2013  and has had marked hyperglycemia with A1c over 13%  When he was not taking his insulin regularly and noncompliant with diet  Recently back on basal insulin only LANTUS 48 units at night  Since his blood sugars are still tending to be significantly high postprandially as seen today  we will need to start mealtime coverage also Most likely he is insulin deficient especially since he has no significant obesity He does need significant amount of diabetes education and has poor understanding of insulin, day-to-day diabetes management including  meal planning   He was previously on metformin which was presumably stopped because of her liver dysfunction; however liver  functions are back to normal now  Complications: None evident, needs followup urine microalbumin  History of carotid vascular disease.Clinically no evidence of peripheral vascular disease   Unknown lipid status And will need to get labs to check this   HYPERTENSION: Appears well controlled  Hepatitis C: This appears controlled although his exam suggest that he has cirrhosis  PLAN:   Start NovoLog 8 units before each meal. Discussed actions of mealtime insulin and need for postprandial control  He will be instructed by nurse educator on how to do this before each meal even when he is eating out  Discussed blood sugar monitoring at various times and targets for 2 hour readings    For now will reduce his Lantus to 45 units as his overnight blood sugars should improve with control of evening hyperglycemia  METFORMIN: Instructed on starting this with 500 mg and increasing every 5 days to a maximum dose of 1500 mg  He will start using Accu-Chek monitor and this was shown to him today  Start regular walking  Cut back on high fat foods and regular soft drinks  Consultation with dietitian  Followup in 3 weeks  Check lipids and urine microalbumin on the next visit and discuss use of statins for cardiovascular protection  Counseling time over 50% of today's 60 minute visit  Delayni Streed 07/17/2014, 10:08 AM   Note: This office note was prepared with Estate agent. Any transcriptional errors that result from this process are unintentional.

## 2014-07-17 NOTE — Telephone Encounter (Signed)
Patients sister would like to speak with Suanne Marker in regards to her brothers visit  She is his caretaker  Please advise   Thank you

## 2014-07-17 NOTE — Progress Notes (Signed)
We reivewed his Lantus insulin dose.  He was reminded that he will reduce his Lantus dose to 24u and that he takes this at bedtime.  He reported good understanding of this. We also reviewed how to take the "new insulin"-- Novolog.  He was directed to take 8u about 5-10 min. Before each meal.  He reported good understanding of this.  It was stressed that if he does not eat, he will not take this insulin.  He reported good understanding of this.  He was shown the Novolog pen and how to dial up 8units.  He re demonstrated this without any difficulty, and repeated that he will take this before each meal.    We also reviewed how to begin to take his metformin.  He was told to take 1 tablet once a day for 5 days, and then increase this to 2 tablets for the next 5 days, and then increase this to 3 tablets.  He reported good understanding of this.  He was shown how to use the [employer] meter and the date and time were set for him.  He was given extra test strips to use until he can get more at [facility].  He was told to test his blood sugars 2 hours after one meal each day--doing a different meal each day.  Written instructions were given for this.   He had no final questions.

## 2014-07-17 NOTE — Patient Instructions (Signed)
Novolog 8 units before each meal  Lantus 45 at bedtime daily  Please check blood sugars at least once a day  about 2 hours after any meal and 5 times per week on waking up.  Please bring blood sugar monitor to each visit

## 2014-08-08 ENCOUNTER — Other Ambulatory Visit (INDEPENDENT_AMBULATORY_CARE_PROVIDER_SITE_OTHER): Payer: 59

## 2014-08-08 DIAGNOSIS — E119 Type 2 diabetes mellitus without complications: Secondary | ICD-10-CM

## 2014-08-08 LAB — COMPREHENSIVE METABOLIC PANEL
ALBUMIN: 3.6 g/dL (ref 3.5–5.2)
ALT: 37 U/L (ref 0–53)
AST: 28 U/L (ref 0–37)
Alkaline Phosphatase: 85 U/L (ref 39–117)
BILIRUBIN TOTAL: 0.7 mg/dL (ref 0.2–1.2)
BUN: 11 mg/dL (ref 6–23)
CHLORIDE: 102 meq/L (ref 96–112)
CO2: 25 meq/L (ref 19–32)
Calcium: 9.7 mg/dL (ref 8.4–10.5)
Creatinine, Ser: 0.8 mg/dL (ref 0.4–1.5)
GFR: 129.54 mL/min (ref >60.00)
GLUCOSE: 120 mg/dL — AB (ref 70–99)
POTASSIUM: 4 meq/L (ref 3.5–5.1)
SODIUM: 136 meq/L (ref 135–145)
TOTAL PROTEIN: 8.5 g/dL — AB (ref 6.0–8.3)

## 2014-08-08 LAB — LIPID PANEL
Cholesterol: 233 mg/dL — ABNORMAL HIGH (ref 0–200)
HDL: 29.1 mg/dL — AB (ref >39.00)
NONHDL: 203.9
Total CHOL/HDL Ratio: 8
Triglycerides: 234 mg/dL — ABNORMAL HIGH (ref 0.0–149.0)
VLDL: 46.8 mg/dL — ABNORMAL HIGH (ref 0.0–40.0)

## 2014-08-08 LAB — MICROALBUMIN / CREATININE URINE RATIO
Creatinine,U: 78.7 mg/dL
MICROALB/CREAT RATIO: 1.1 mg/g (ref 0.0–30.0)
Microalb, Ur: 0.9 mg/dL (ref 0.0–1.9)

## 2014-08-08 LAB — LDL CHOLESTEROL, DIRECT: LDL DIRECT: 140.6 mg/dL

## 2014-08-13 ENCOUNTER — Ambulatory Visit: Payer: 59 | Admitting: Endocrinology

## 2014-08-15 ENCOUNTER — Encounter: Payer: Self-pay | Admitting: Endocrinology

## 2014-08-15 ENCOUNTER — Ambulatory Visit (INDEPENDENT_AMBULATORY_CARE_PROVIDER_SITE_OTHER): Payer: 59 | Admitting: Endocrinology

## 2014-08-15 VITALS — BP 124/62 | HR 77 | Temp 98.6°F | Resp 16 | Ht 71.0 in | Wt 204.6 lb

## 2014-08-15 DIAGNOSIS — IMO0002 Reserved for concepts with insufficient information to code with codable children: Secondary | ICD-10-CM

## 2014-08-15 DIAGNOSIS — I10 Essential (primary) hypertension: Secondary | ICD-10-CM

## 2014-08-15 DIAGNOSIS — E1165 Type 2 diabetes mellitus with hyperglycemia: Secondary | ICD-10-CM

## 2014-08-15 DIAGNOSIS — E782 Mixed hyperlipidemia: Secondary | ICD-10-CM

## 2014-08-15 MED ORDER — ATORVASTATIN CALCIUM 20 MG PO TABS: 20.0000 mg | ORAL_TABLET | Freq: Every day | ORAL | Status: DC

## 2014-08-15 NOTE — Progress Notes (Signed)
Patient ID: Jesse Wilcox, male   DOB: 08-26-1951, 63 y.o.   MRN: [id number]            Reason for Appointment:  Followup for Type 2 Diabetes  Referring physician: Dr. Everlene Farrier  History of Present Illness:          Diagnosis: Type 2 diabetes mellitus, date of diagnosis:  ? 2010       Past history: He was initially diagnosed to have diabetes when he had weight loss. Not clear what his initial glucose was He was put on metformin and was continued on this until 02/2012 At that time he was changed to Lantus for unclear reasons, possibly because of his chronic hepatitis His A1c has been mostly over 7% since then  Recent history:  Because of poor blood sugar control partly from noncompliance he was started on basal bolus insulin regimen on his last visit Had been taking 48 units of Lantus prior to his visit and he was told to take NovoLog 8 units before meals He was also started on metformin and was told to titrate it up to 1500 mg He increased the metformin to twice a day but not any higher because of blood sugars being low normal He is getting significant help from his sister as he is able to understand instructions consistently He took NovoLog initially but because of an episode of low blood sugar he has not taken this any more Has not brought his blood sugar readings were reviewed Also has checked his blood sugars only before breakfast and supper He claims his blood sugars are excellent now       Oral hypoglycemic drugs the patient is taking are:   metformin ER 500 mg twice a day     Side effects from medications have been: None INSULIN regimen is described as: 40 units Lantus   Compliance with the medical regimen: Improving Hypoglycemia: As above    Glucose monitoring:  done one time a day         Glucometer: Accucheck  Blood Glucose readings by recall Am 71-90  Hs <110   Self-care: The diet that the patient has been following is: Recently less fried food and sweets, occasionally  drinking Pepsi    Meals: 3 meals per day. Breakfast is Eggs, toast, grits, bacon, usually eating at a restaurant in the morning         Exercise: Little          Dietician visit, most recent: At diagnosis only              Weight history: Wt Readings from Last 3 Encounters:  08/15/14 204 lb 9.6 oz (92.806 kg)  07/17/14 205 lb 12.8 oz (93.35 kg)  06/23/14 200 lb 6.4 oz (90.901 kg)    Glycemic control:   Lab Results  Component Value Date   HGBA1C 13.6 06/23/2014   HGBA1C 9.1 03/16/2014   HGBA1C 7.7 09/22/2013   Lab Results  Component Value Date   MICROALBUR 0.9 08/08/2014   CREATININE 0.8 08/08/2014         Medication List       This list is accurate as of: 08/15/14 11:24 AM.  Always use your most recent med list.               ACCU-CHEK SOFTCLIX LANCETS lancets  Use as instructed to check blood sugar 2 times per day dx code 250.00     amLODipine 10 MG tablet  Commonly known  as:  NORVASC  Take 1 tablet (10 mg total) by mouth daily.     aspirin EC 81 MG tablet  Take 81 mg by mouth daily.     benazepril-hydrochlorthiazide 20-12.5 MG per tablet  Commonly known as:  LOTENSIN HCT  Take 1 tablet by mouth daily.     clopidogrel 75 MG tablet  Commonly known as:  PLAVIX  Take 1 tablet (75 mg total) by mouth daily.     cyclobenzaprine 5 MG tablet  Commonly known as:  FLEXERIL  Take 0.5 tablets (2.5 mg total) by mouth at bedtime.     glucose blood test strip  Commonly known as:  ACCU-CHEK AVIVA PLUS  Use as instructed to check blood sugar 2 times a day dx code 250.00     insulin aspart 100 UNIT/ML FlexPen  Commonly known as:  NOVOLOG FLEXPEN  Inject 8 Units into the skin 3 (three) times daily with meals.     Insulin Glargine 100 UNIT/ML Solostar Pen  Commonly known as:  LANTUS SOLOSTAR  44 units daily     Insulin Pen Needle 31G X 8 MM Misc  Use as directed     ketoconazole 2 % cream  Commonly known as:  NIZORAL  Apply topically daily.     metFORMIN 500  MG 24 hr tablet  Commonly known as:  GLUCOPHAGE-XR  Take 3 tablets (1,500 mg total) by mouth daily with supper.     multivitamin with minerals Tabs tablet  Take 1 tablet by mouth daily.        Allergies: No Known Allergies  Past Medical History  Diagnosis Date  . Chronic hepatitis C   . Diabetes mellitus   . Carotid artery occlusion     Past Surgical History  Procedure Laterality Date  . Aortic arch angiogram  04-15-12    By Dr. Einar Gip    Family History  Problem Relation Age of Onset  . Diabetes Mother   . Heart disease Mother   . Hypertension Mother   . Heart attack Mother   . Diabetes Father   . Heart disease Father   . Hypertension Father   . Diabetes Sister   . Hyperlipidemia Sister   . Heart disease Sister   . Heart attack Sister   . Peripheral vascular disease Sister   . Hyperlipidemia Brother   . Hypertension Brother     Social History:  reports that he has been smoking Cigarettes.  He has a 21 pack-year smoking history. He has never used smokeless tobacco. He reports that he does not drink alcohol or use illicit drugs.    Review of Systems       Vision is normal. Most recent eye exam was 2014       Lipids: No levels available in his record       Lab Results  Component Value Date   CHOL 233* 08/08/2014                  Skin: No rash or infections     Thyroid:  No  unusual fatigue or history of thyroid disease.     The blood pressure has been controlled with his regimen of amlodipine and Lotensin HCT     No swelling of feet.     No shortness of breath or chest tightness  on exertion.     Bowel habits: Normal.     He has been treated for hepatitis C      No joint  pains.          No history of Numbness, tingling or burning in feet    No history of CVA or TIA. Not clear how his diagnosis of carotid vascular disease was established, has had surgery and is on aspirin     LABS:  No visits with results within 1 Week(s) from this  visit. Latest known visit with results is:  Appointment on 08/08/2014  Component Date Value Ref Range Status  . Cholesterol 08/08/2014 233* 0 - 200 mg/dL Final   ATP III Classification       Desirable:  < 200 mg/dL               Borderline High:  200 - 239 mg/dL          High:  > = 240 mg/dL  . Triglycerides 08/08/2014 234.0* 0.0 - 149.0 mg/dL Final   Normal:  <150 mg/dLBorderline High:  150 - 199 mg/dL  . HDL 08/08/2014 29.10* >39.00 mg/dL Final  . VLDL 08/08/2014 46.8* 0.0 - 40.0 mg/dL Final  . Total CHOL/HDL Ratio 08/08/2014 8   Final                  Men          Women1/2 Average Risk     3.4          3.3Average Risk          5.0          4.42X Average Risk          9.6          7.13X Average Risk          15.0          11.0                      . NonHDL 08/08/2014 203.90   Final   NOTE:  Non-HDL goal should be 30 mg/dL higher than patient's LDL goal (i.e. LDL goal of < 70 mg/dL, would have non-HDL goal of < 100 mg/dL)  . Sodium 08/08/2014 136  135 - 145 mEq/L Final  . Potassium 08/08/2014 4.0  3.5 - 5.1 mEq/L Final  . Chloride 08/08/2014 102  96 - 112 mEq/L Final  . CO2 08/08/2014 25  19 - 32 mEq/L Final  . Glucose, Bld 08/08/2014 120* 70 - 99 mg/dL Final  . BUN 08/08/2014 11  6 - 23 mg/dL Final  . Creatinine, Ser 08/08/2014 0.8  0.4 - 1.5 mg/dL Final  . Total Bilirubin 08/08/2014 0.7  0.2 - 1.2 mg/dL Final  . Alkaline Phosphatase 08/08/2014 85  39 - 117 U/L Final  . AST 08/08/2014 28  0 - 37 U/L Final  . ALT 08/08/2014 37  0 - 53 U/L Final  . Total Protein 08/08/2014 8.5* 6.0 - 8.3 g/dL Final  . Albumin 08/08/2014 3.6  3.5 - 5.2 g/dL Final  . Calcium 08/08/2014 9.7  8.4 - 10.5 mg/dL Final  . GFR 08/08/2014 129.54  >60.00 mL/min Final  . Microalb, Ur 08/08/2014 0.9  0.0 - 1.9 mg/dL Final  . Creatinine,U 08/08/2014 78.7   Final  . Microalb Creat Ratio 08/08/2014 1.1  0.0 - 30.0 mg/g Final  . Direct LDL 08/08/2014 140.6   Final   Optimal:  <100 mg/dLNear or Above Optimal:   100-129 mg/dLBorderline High:  130-159 mg/dLHigh:  160-189 mg/dLVery High:  >190 mg/dL    Physical Examination:  BP 124/62  Pulse 77  Temp(Src) 98.6 F (37 C)  Resp 16  Ht 5\' 11"  (1.803 m)  Wt 204 lb 9.6 oz (92.806 kg)  BMI 28.55 kg/m2  SpO2 94%         ASSESSMENT:  Diabetes type 2, uncontrolled  He has improved his blood sugar control possibly from adding metformin but also improving his diet Currently is taking less Lantus insulin and reportedly has not had higher readings later in the day even without NovoLog it was initially given to him  HYPERLIPIDEMIA: He has significantly high LDL and triglycerides and has not been on medications despite history of carotid vascular disease. Does not have much understanding of hyperlipidemia and management  HYPERTENSION: Appears well controlled  PLAN:   Since his blood sugars appear to be low normal will increase his metformin to 1500 mg and reduce his Lantus to 30 units  Discussed timing of glucose monitoring and blood sugar targets including was prandial  He will take 30 units of Lantus and adjust further if fasting blood sugars are not within target, may increase it to 35 blood sugars started increasing  May need to start back on NovoLog if postprandial readings are over 180  Advised him to bring his monitor on each visit  Recommended consultation with dietitian but he refuses  Start Lipitor 10 mg daily, discussed benefits of statin drugs, effects on lipids and possible side effects and need for long-term treatment  Jaion Lagrange 08/15/2014, 11:24 AM   Note: This office note was prepared with Estate agent. Any transcriptional errors that result from this process are unintentional.

## 2014-08-15 NOTE — Patient Instructions (Addendum)
Metformin 1 in am and 2 at dinner  Lantus 30 units once daily  If sugar after meals is > 180 start Novolog 4-5 units before that meal  Please check blood sugars at least half the time about 2 hours after any meal and times per week on waking up. Please bring blood sugar monitor to each visit  Walk daily

## 2014-09-05 ENCOUNTER — Other Ambulatory Visit: Payer: 59

## 2014-09-14 ENCOUNTER — Ambulatory Visit: Payer: 59 | Admitting: Endocrinology

## 2014-09-26 ENCOUNTER — Telehealth: Payer: Self-pay | Admitting: Radiology

## 2014-09-26 MED ORDER — GLUCOSE BLOOD VI STRP: ORAL_STRIP | Status: DC

## 2014-09-26 MED ORDER — INSULIN GLARGINE 100 UNIT/ML SOLOSTAR PEN: 40.0000 [IU] | PEN_INJECTOR | Freq: Every day | SUBCUTANEOUS | Status: DC

## 2014-09-26 MED ORDER — ACCU-CHEK SOFTCLIX LANCETS MISC: Status: DC

## 2014-09-26 NOTE — Telephone Encounter (Signed)
Patient called yesterday about his insulin, no message was taken. Patient states he can not afford his insulin. He uses Lantus,he can get a copay card online, pay only $25. Called him, but he does not have access to a computer. Clarise Cruz is going to leave a copay card at front desk for him. He also needs test strips/ lancets resent these, endocrinologist previously sent, but pharmacy did not have them

## 2014-09-26 NOTE — Telephone Encounter (Signed)
Filled out papers for patient assistance program meds will be mailed to 102 can you call him when they arrive?

## 2014-09-26 NOTE — Telephone Encounter (Signed)
Called pharmacy about his lantus, I have a coupon, but need to know if this will help or if it will still be expensive, Max savings is $100. Jesse Wilcox pharmacy states still over [phone]. If we change to vial instead of pens, savings will be more, but still not affordable.

## 2014-10-11 ENCOUNTER — Encounter (HOSPITAL_COMMUNITY): Payer: Self-pay | Admitting: Cardiology

## 2014-10-12 ENCOUNTER — Telehealth: Payer: Self-pay | Admitting: Radiology

## 2014-10-12 NOTE — Telephone Encounter (Signed)
Patient called for Jesse Wilcox, he is almost out of insulin, please call him, [phone]

## 2014-10-12 NOTE — Telephone Encounter (Signed)
Spoke to Dr. Lorelei Pont and she has authorized one of the sample packs of Lantus to be given to the pt.  Spoke to pt and advised to pick up today by 5pm.

## 2014-11-06 ENCOUNTER — Other Ambulatory Visit: Payer: Self-pay | Admitting: *Deleted

## 2014-11-14 ENCOUNTER — Ambulatory Visit (INDEPENDENT_AMBULATORY_CARE_PROVIDER_SITE_OTHER): Payer: 59

## 2014-11-14 ENCOUNTER — Ambulatory Visit (INDEPENDENT_AMBULATORY_CARE_PROVIDER_SITE_OTHER): Payer: 59 | Admitting: Emergency Medicine

## 2014-11-14 VITALS — BP 130/70 | HR 90 | Temp 97.3°F | Resp 18 | Ht 71.25 in | Wt 206.4 lb

## 2014-11-14 DIAGNOSIS — M545 Low back pain, unspecified: Secondary | ICD-10-CM

## 2014-11-14 DIAGNOSIS — E119 Type 2 diabetes mellitus without complications: Secondary | ICD-10-CM

## 2014-11-14 DIAGNOSIS — M4316 Spondylolisthesis, lumbar region: Secondary | ICD-10-CM

## 2014-11-14 LAB — GLUCOSE, POCT (MANUAL RESULT ENTRY): POC GLUCOSE: 117 mg/dL — AB (ref 70–99)

## 2014-11-14 MED ORDER — METHYLPREDNISOLONE ACETATE 80 MG/ML IJ SUSP
80.0000 mg | Freq: Once | INTRAMUSCULAR | Status: AC
  Administered 2014-11-14: 80 mg via INTRAMUSCULAR

## 2014-11-14 NOTE — Progress Notes (Signed)
Subjective:  This chart was scribed for Arlyss Queen, MD by Mercy Moore, Medial Scribe. This patient was seen in room 9 and the patient's care was started at 10:24 AM.    Patient ID: Jesse Wilcox, male    DOB: 23-Apr-1951, 64 y.o.   MRN: [id number] Chief Complaint  Patient presents with  . Back Pain    x3 days    HPI HPI Comments: Jesse Wilcox is a 64 y.o. male who presents to the [facility] complaining of shooting left back and left buttock pain ongoing for three days now. Patient reports pain and stiffness especially when getting out of bed in the morning. Patient reports alleviation of his pain with activity, as the day progresses. Patient however reports re presentation of his acute pain when standing after sitting for any periods longer than 15 minutes. Patient denies recent heavy lifting or direct injury. Patient denies similar back pain. Patient denies bladder/bowel incontinence, or radiation of pain into his lower extremities. Patient reports that he has begun workout regimen, but this as causation of his back pain.  Patient shares history of chronic Hep C. Patient reports that his liver is no longer infected after 12 week treatment. Patient reports resolution after 4 weeks, but states that he continued to his antiviral course until completion.   Patient Active Problem List   Diagnosis Date Noted  . Essential hypertension, benign 07/17/2014  . Hep C w/o coma, chronic 10/22/2012  . SOB (shortness of breath) 08/03/2012  . Occlusion and stenosis of carotid artery without mention of cerebral infarction 04/15/2012  . DM type 2 (diabetes mellitus, type 2) 02/10/2012   Past Medical History  Diagnosis Date  . Chronic hepatitis C   . Diabetes mellitus   . Carotid artery occlusion    Past Surgical History  Procedure Laterality Date  . Aortic arch angiogram  04-15-12    By Dr. Einar Gip  . Carotid angiogram N/A 04/15/2012    Procedure: CAROTID ANGIOGRAM;   Surgeon: Laverda Page, MD;  Location: [facility] CATH LAB;  Service: Cardiovascular;  Laterality: N/A;  . Arch aortogram  04/15/2012    Procedure: ARCH AORTOGRAM;  Surgeon: Laverda Page, MD;  Location: [facility] CATH LAB;  Service: Cardiovascular;;  . Carotid stent insertion N/A 08/23/2012    Procedure: CAROTID STENT INSERTION;  Surgeon: Elam Dutch, MD;  Location: [facility] CATH LAB;  Service: Cardiovascular;  Laterality: N/A;   No Known Allergies Prior to Admission medications   Medication Sig Start Date End Date Taking? Authorizing Provider  ACCU-CHEK SOFTCLIX LANCETS lancets Use as instructed to check blood sugar 2 times per day dx code 250.00 09/26/14  Yes Darlyne Russian, MD  amLODipine (NORVASC) 10 MG tablet Take 1 tablet (10 mg total) by mouth daily. 03/16/14  Yes Robyn Haber, MD  aspirin EC 81 MG tablet Take 81 mg by mouth daily.   Yes Historical Provider, MD  atorvastatin (LIPITOR) 20 MG tablet Take 1 tablet (20 mg total) by mouth daily. 08/15/14  Yes Elayne Snare, MD  benazepril-hydrochlorthiazide (LOTENSIN HCT) 20-12.5 MG per tablet Take 1 tablet by mouth daily. 03/16/14  Yes Robyn Haber, MD  clopidogrel (PLAVIX) 75 MG tablet Take 1 tablet (75 mg total) by mouth daily. 03/16/14  Yes Robyn Haber, MD  cyclobenzaprine (FLEXERIL) 5 MG tablet Take 0.5 tablets (2.5 mg total) by mouth at bedtime. 03/16/14  Yes Robyn Haber, MD  glucose blood (ACCU-CHEK AVIVA PLUS) test strip Use as instructed to  check blood sugar 2 times a day dx code 250.00 09/26/14  Yes Darlyne Russian, MD  glucose blood (ONE TOUCH ULTRA TEST) test strip 1 each by Other route as needed for other. Use as instructed to check blood sugar 2 times per day dx code E11.9   Yes Historical Provider, MD  insulin aspart (NOVOLOG FLEXPEN) 100 UNIT/ML FlexPen Inject 8 Units into the skin 3 (three) times daily with meals. 07/17/14  Yes Elayne Snare, MD  Insulin Glargine (LANTUS) 100 UNIT/ML Solostar Pen Inject 40 Units into the skin daily.  09/26/14  Yes Darlyne Russian, MD  Insulin Pen Needle 31G X 8 MM MISC Use as directed 03/16/14  Yes Robyn Haber, MD  ketoconazole (NIZORAL) 2 % cream Apply topically daily. 03/16/14  Yes Robyn Haber, MD  metFORMIN (GLUCOPHAGE-XR) 500 MG 24 hr tablet Take 3 tablets (1,500 mg total) by mouth daily with supper. 07/17/14  Yes Elayne Snare, MD  Multiple Vitamin (MULTIVITAMIN WITH MINERALS) TABS Take 1 tablet by mouth daily.   Yes Historical Provider, MD  [facility] DELICA LANCETS FINE MISC by Does not apply route. Use to check blood sugar 2 times per day dx code E11.9   Yes Historical Provider, MD   History   Social History  . Marital Status: Single    Spouse Name: N/A    Number of Children: N/A  . Years of Education: N/A   Occupational History  . Not on file.   Social History Main Topics  . Smoking status: Current Every Day Smoker -- 0.50 packs/day for 42 years    Types: Cigarettes  . Smokeless tobacco: Never Used  . Alcohol Use: No  . Drug Use: No  . Sexual Activity: Not on file   Other Topics Concern  . Not on file   Social History Narrative      Review of Systems  Constitutional: Negative for fever and chills.  Gastrointestinal: Negative for diarrhea and constipation.  Musculoskeletal: Positive for back pain.  Skin: Negative for rash.  Neurological: Negative for weakness and numbness.       Objective:   Physical Exam  Nursing note and vitals reviewed.   CONSTITUTIONAL: Well developed/well nourished HEAD: Normocephalic/atraumatic EYES: EOMI/PERRL ENMT: Mucous membranes moist NECK: supple no meningeal signs SPINE/BACK: tender L5, S1 on left; straight leg raise pain at 90 deg, motor strength 5/5; DT in knees are 2+, right ankle 2+, left ankle absent  CV: S1/S2 noted, no murmurs/rubs/gallops noted LUNGS: Lungs are clear to auscultation bilaterally, no apparent distress ABDOMEN: soft, nontender, no rebound or guarding, bowel sounds noted throughout abdomen GU:no cva  tenderness NEURO: Pt is awake/alert/appropriate, moves all extremitiesx4.  No facial droop.   EXTREMITIES: pulses normal/equal, full ROM SKIN: warm, color normal PSYCH: no abnormalities of mood noted, alert and oriented to situation  Filed Vitals:   11/14/14 1005  BP: 130/70  Pulse: 90  Temp: 97.3 F (36.3 C)  TempSrc: Oral  Resp: 18  Height: 5' 11.25" (1.81 m)  Weight: 206 lb 6.4 oz (93.622 kg)  SpO2: 97%   [facility] reading (PRIMARY) by  Dr Everlene Farrier there is a spondylolisthesis L4-L5. There is vascular calcifications.. Results for orders placed or performed in visit on 11/14/14  POCT glucose (manual entry)  Result Value Ref Range   POC Glucose 117 (A) 70 - 99 mg/dl        Assessment & Plan:   will proceed with MRI. I did check his sugar today.  will treat with 80 of  Depo-Medrol. MRI ordered of the lumbar spine. I did let him know that his sugar may run a little high for the next 10 days   I personally performed the services described in this documentation, which was scribed in my presence. The recorded information has been reviewed and is accurate.

## 2014-11-14 NOTE — Patient Instructions (Signed)
Spondylolisthesis °with Rehab °The slipping of one or multiple vertebrae out of the correct anatomical position is a condition known as spondylolisthesis. Spondylolisthesis is most common in adolescents and is caused by a number of different reasons, such as vertebral fracture or something you are born with (congenital). Spondylolisthesis is diagnosed with the use of X-rays. °SYMPTOMS  °· Dull, achy pain in the lower back. °· Pain that worsens with extension of the spine. °· Tightness of the muscles on the back of the thigh. °· Lower back stiffness. °· Signs of nerve damage: pain, numbness, or weakness affecting one or both lower extremities. °· Muscle wasting (atrophy), uncommon. °· Loss of stool (bowel) or urine (bladder) function. °CAUSES  °The symptoms of spondylolisthesis are caused by one or more vertebrae that are out of alignment, placing pressure on the spinal cord. Common mechanisms of injury include: °· Congenital defect of the spine. °· Degenerative process. °· Stress fracture of the spine. °· Fracture due to trauma to the spine. °RISK INCREASES WITH: °· Activities that have a risk of hyperextending the back. °· Activities that have a risk of excessively rotating the spine. °· Poor strength and flexibility. °· Failure to warm up properly before activity. °· Family history of spondylolysis or spondylolisthesis. °· Improper sports technique. °PREVENTION °· Warm up and stretch properly before activity. °· Allow for adequate recovery between workouts. °· Maintain physical fitness: °¨ Strength, flexibility, and endurance. °¨ Cardiovascular fitness. °· Learn and use proper technique. When possible, have a coach correct improper technique. °PROGNOSIS  °If treated properly, the spondylolisthesis usually resolves. °RELATED COMPLICATIONS  °· Recurrent symptoms that result in a chronic problem. °· Inability to compete in athletics. °· Prolonged healing time, if improperly treated or reinjured. °· Failure of the  fracture to heal (nonunion). °· Healing of the fracture in a poor position (malunion). °TREATMENT °Treatment initially involves resting from any activities that aggravate the symptoms and the use of ice and medications to help reduce pain and inflammation. The use of strengthening and stretching exercises may help reduce pain with activity. These exercises may be performed at home or with referral to a therapist. It is important to learn how to use proper body mechanics as to not place undue stress on your spine. If the injury is severe, then your caregiver may recommend a back brace to allow for healing, or even surgery. Surgery often involves fusing two adjacent vertebrae so no movement is allowed between them.  °MEDICATION  °· If pain medication is necessary, then nonsteroidal anti-inflammatory medications, such as aspirin and ibuprofen, or other minor pain relievers, such as acetaminophen, are often recommended. °· Do not take pain medication for 7 days before surgery. °· Prescription pain relievers may be given if deemed necessary by your caregiver. Use only as directed and only as much as you need. °HEAT AND COLD °· Cold treatment (icing) relieves pain and reduces inflammation. Cold treatment should be applied for 10 to 15 minutes every 2 to 3 hours for inflammation and pain and immediately after any activity that aggravates your symptoms. Use ice packs or massage the area with a piece of ice (ice massage). °· Heat treatment may be used prior to performing the stretching and strengthening activities prescribed by your caregiver, physical therapist, or athletic trainer. Use a heat pack or soak the injury in warm water. °SEEK MEDICAL CARE IF: °· Treatment seems to offer no benefit, or the condition worsens. °· Any medications produce adverse side effects. °· Any complications from surgery   occur:  Pain, numbness, or coldness in the extremity operated upon.  Discoloration of the nail beds (they become blue or  gray) of the extremity operated upon.  Signs of infections (fever, pain, inflammation, redness, or persistent bleeding). EXERCISES RANGE OF MOTION (ROM) AND STRETCHING EXERCISES - Spondylolisthesis Most people with low back pain will find that their symptoms worsen with either excessive bending forward (flexion) or arching at the low back (extension). The exercises which will help resolve your symptoms will focus on the opposite motion. Your physician, physical therapist or athletic trainer will help you determine which exercises will be most helpful to resolve your low back pain. Do not complete any exercises without first consulting with your clinician. Discontinue any exercises which worsen your symptoms until you speak to your clinician. If you have pain, numbness or tingling which travels down into your buttocks, leg, or foot, the goal of the therapy is for these symptoms to move closer to your back and eventually resolve. Occasionally, these leg symptoms will get better, but your low back pain may worsen; this is typically an indication of progress in your rehabilitation. Be certain to be very alert to any changes in your symptoms and the activities in which you participated in the 24 hours prior to the change. Sharing this information with your clinician will allow him/her to most efficiently treat your condition. These exercises may help you when beginning to rehabilitate your injury. Your symptoms may resolve with or without further involvement from your physician, physical therapist or athletic trainer. While completing these exercises, remember:   Restoring tissue flexibility helps normal motion to return to the joints. This allows healthier, less painful movement and activity.  An effective stretch should be held for at least 30 seconds.  A stretch should never be painful. You should only feel a gentle lengthening or release in the stretched tissue.

## 2014-11-25 ENCOUNTER — Inpatient Hospital Stay: Admission: RE | Admit: 2014-11-25 | Payer: 59 | Source: Ambulatory Visit

## 2014-11-28 ENCOUNTER — Telehealth: Payer: Self-pay

## 2014-11-28 DIAGNOSIS — M545 Low back pain, unspecified: Secondary | ICD-10-CM

## 2014-11-28 DIAGNOSIS — I10 Essential (primary) hypertension: Secondary | ICD-10-CM

## 2014-11-28 DIAGNOSIS — N481 Balanitis: Secondary | ICD-10-CM

## 2014-11-28 DIAGNOSIS — I2510 Atherosclerotic heart disease of native coronary artery without angina pectoris: Secondary | ICD-10-CM

## 2014-11-28 NOTE — Telephone Encounter (Signed)
Pt states he would the Dr to call in all his medicines since he's now changing insurances and pharmacies. States it is so many that he isn't able to name them but he need all Please call pt at [place]

## 2014-11-29 NOTE — Telephone Encounter (Signed)
Spoke to pt- he does not need to change pharmacies. He needs to change the supply to a 90 day supply instead of the 30 day as this would be cheaper for him.

## 2014-11-29 NOTE — Telephone Encounter (Signed)
Please advise if #90 can be called in for him- he was here on 1/13.    Referrals- can we look into the MRI referral. [facility] cancelled the appt for him and they state that they need to get a new authorization due to the new year. I looks like the referral was authorized just this month. Please help. They are already closed this evening.

## 2014-11-30 MED ORDER — INSULIN ASPART 100 UNIT/ML FLEXPEN: 8.0000 [IU] | PEN_INJECTOR | Freq: Three times a day (TID) | SUBCUTANEOUS | Status: DC

## 2014-11-30 MED ORDER — BENAZEPRIL-HYDROCHLOROTHIAZIDE 20-12.5 MG PO TABS: 1.0000 | ORAL_TABLET | Freq: Every day | ORAL | Status: DC

## 2014-11-30 MED ORDER — KETOCONAZOLE 2 % EX CREA: TOPICAL_CREAM | Freq: Every day | CUTANEOUS | Status: DC

## 2014-11-30 MED ORDER — AMLODIPINE BESYLATE 10 MG PO TABS
10.0000 mg | ORAL_TABLET | Freq: Every day | ORAL | Status: DC
Start: 2014-11-30 — End: 2015-10-29

## 2014-11-30 MED ORDER — INSULIN PEN NEEDLE 31G X 8 MM MISC: Status: DC

## 2014-11-30 MED ORDER — INSULIN GLARGINE 100 UNIT/ML SOLOSTAR PEN: 40.0000 [IU] | PEN_INJECTOR | Freq: Every day | SUBCUTANEOUS | Status: DC

## 2014-11-30 MED ORDER — METFORMIN HCL ER 500 MG PO TB24: 1500.0000 mg | ORAL_TABLET | Freq: Every day | ORAL | Status: DC

## 2014-11-30 MED ORDER — CLOPIDOGREL BISULFATE 75 MG PO TABS: 75.0000 mg | ORAL_TABLET | Freq: Every day | ORAL | Status: DC

## 2014-11-30 MED ORDER — ATORVASTATIN CALCIUM 20 MG PO TABS: 20.0000 mg | ORAL_TABLET | Freq: Every day | ORAL | Status: DC

## 2014-11-30 MED ORDER — GLUCOSE BLOOD VI STRP: ORAL_STRIP | Status: DC

## 2014-11-30 NOTE — Telephone Encounter (Signed)
Okay to get 90 day supplies of medications. Please reorder MRI of the lumbar spine no contrast

## 2014-11-30 NOTE — Telephone Encounter (Signed)
Sent refills and MRI in as directed. LM to advise pt.

## 2014-12-06 ENCOUNTER — Telehealth: Payer: Self-pay

## 2014-12-06 NOTE — Telephone Encounter (Signed)
Pt is scheduled for mri l spine on Saturday and is neededing more clinical notes case number is [id number]  Please call [phone]

## 2014-12-07 ENCOUNTER — Telehealth: Payer: Self-pay

## 2014-12-07 MED ORDER — INSULIN LISPRO 100 UNIT/ML (KWIKPEN): 8.0000 [IU] | PEN_INJECTOR | Freq: Three times a day (TID) | SUBCUTANEOUS | Status: DC

## 2014-12-07 NOTE — Telephone Encounter (Signed)
[employer] sent notice that Novolog is not covered by plan unless pt has failed equivalent Humalog. Dr Everlene Farrier OKd change to Humalog as long as it's equivalent and I sent in the new Rx.

## 2014-12-07 NOTE — Telephone Encounter (Signed)
I called to give clinical information. It now requires a Peer to Peer. His case number is [id number], # [phone] opt [facility]

## 2014-12-07 NOTE — Telephone Encounter (Signed)
Jesse Wilcox, nurse reviewer.  Dr. Truddie Coco, Medical Director  Authorization [id number]

## 2014-12-08 ENCOUNTER — Inpatient Hospital Stay: Admission: RE | Admit: 2014-12-08 | Payer: 59 | Source: Ambulatory Visit

## 2014-12-08 ENCOUNTER — Telehealth: Payer: Self-pay

## 2014-12-08 NOTE — Telephone Encounter (Signed)
Pt called wanting Dr. Everlene Farrier to help him get his Insulin Glargine (LANTUS) 100 UNIT/ML Solostar Pen [[id number]] medication. He said Dr. Everlene Farrier did something for him last time? Please advise at [phone]

## 2014-12-11 NOTE — Telephone Encounter (Signed)
We can give him one Lantus pen to hold him until he can get his medication. He  Are no longer are able to get samples of the SoloSTAR's.

## 2014-12-11 NOTE — Telephone Encounter (Signed)
Spoke with Jesse Wilcox, he states Dr. Everlene Farrier received his medication through a company when he didn't have insurance. Can we give him a sample if available. Please advise.

## 2014-12-12 NOTE — Telephone Encounter (Signed)
Lamar Blinks spoke with pt, she advised him the message below. Pt understood.

## 2015-04-22 ENCOUNTER — Other Ambulatory Visit: Payer: Self-pay | Admitting: Family Medicine

## 2015-04-22 ENCOUNTER — Ambulatory Visit (INDEPENDENT_AMBULATORY_CARE_PROVIDER_SITE_OTHER): Payer: 59 | Admitting: Internal Medicine

## 2015-04-22 ENCOUNTER — Ambulatory Visit (INDEPENDENT_AMBULATORY_CARE_PROVIDER_SITE_OTHER): Payer: 59

## 2015-04-22 VITALS — BP 126/80 | HR 103 | Temp 97.6°F | Resp 17 | Ht 70.5 in | Wt 198.0 lb

## 2015-04-22 DIAGNOSIS — Z87898 Personal history of other specified conditions: Secondary | ICD-10-CM

## 2015-04-22 DIAGNOSIS — L03115 Cellulitis of right lower limb: Secondary | ICD-10-CM

## 2015-04-22 DIAGNOSIS — Z794 Long term (current) use of insulin: Secondary | ICD-10-CM | POA: Diagnosis not present

## 2015-04-22 DIAGNOSIS — S90121A Contusion of right lesser toe(s) without damage to nail, initial encounter: Secondary | ICD-10-CM | POA: Diagnosis not present

## 2015-04-22 DIAGNOSIS — M79674 Pain in right toe(s): Secondary | ICD-10-CM

## 2015-04-22 DIAGNOSIS — E119 Type 2 diabetes mellitus without complications: Secondary | ICD-10-CM | POA: Diagnosis not present

## 2015-04-22 DIAGNOSIS — Z789 Other specified health status: Secondary | ICD-10-CM

## 2015-04-22 DIAGNOSIS — IMO0001 Reserved for inherently not codable concepts without codable children: Secondary | ICD-10-CM

## 2015-04-22 LAB — POCT CBC
Granulocyte percent: 65.3 %G (ref 37–80)
HCT, POC: 47.9 % (ref 43.5–53.7)
Hemoglobin: 15.6 g/dL (ref 14.1–18.1)
Lymph, poc: 2.9 (ref 0.6–3.4)
MCH: 27.8 pg (ref 27–31.2)
MCHC: 32.5 g/dL (ref 31.8–35.4)
MCV: 85.4 fL (ref 80–97)
MID (cbc): 0.7 (ref 0–0.9)
MPV: 7.8 fL (ref 0–99.8)
POC GRANULOCYTE: 6.7 (ref 2–6.9)
POC LYMPH PERCENT: 28 %L (ref 10–50)
POC MID %: 6.7 % (ref 0–12)
Platelet Count, POC: 160 10*3/uL (ref 142–424)
RBC: 5.61 M/uL (ref 4.69–6.13)
RDW, POC: 13.9 %
WBC: 10.3 10*3/uL — AB (ref 4.6–10.2)

## 2015-04-22 LAB — COMPREHENSIVE METABOLIC PANEL
ALT: 56 U/L — AB (ref 0–53)
AST: 24 U/L (ref 0–37)
Albumin: 4.2 g/dL (ref 3.5–5.2)
Alkaline Phosphatase: 100 U/L (ref 39–117)
BUN: 11 mg/dL (ref 6–23)
CALCIUM: 9.6 mg/dL (ref 8.4–10.5)
CHLORIDE: 107 meq/L (ref 96–112)
CO2: 25 mEq/L (ref 19–32)
Creat: 0.75 mg/dL (ref 0.50–1.35)
GLUCOSE: 193 mg/dL — AB (ref 70–99)
Potassium: 4.1 mEq/L (ref 3.5–5.3)
Sodium: 139 mEq/L (ref 135–145)
Total Bilirubin: 0.8 mg/dL (ref 0.2–1.2)
Total Protein: 7.6 g/dL (ref 6.0–8.3)

## 2015-04-22 LAB — POCT GLYCOSYLATED HEMOGLOBIN (HGB A1C): HEMOGLOBIN A1C: 9.1

## 2015-04-22 LAB — GLUCOSE, POCT (MANUAL RESULT ENTRY): POC Glucose: 195 mg/dl — AB (ref 70–99)

## 2015-04-22 MED ORDER — DOXYCYCLINE HYCLATE 100 MG PO TABS: 100.0000 mg | ORAL_TABLET | Freq: Two times a day (BID) | ORAL | Status: DC

## 2015-04-22 NOTE — Progress Notes (Signed)
   Subjective:    Patient ID: Jesse Wilcox, male    DOB: Sep 19, 1951, 64 y.o.   MRN: [id number]  HPI 64 year old male complains of injury to 2nd toe on left foot. His toenail got caught on floor and he has pain and bruising of the toe with redness and swelling. January 2016 was his last appointment with Dr. Everlene Farrier . Has uncontrolled DM. Last A1c was over 13. He states dr. Everlene Farrier is his primary. Ruffin Frederick last saw him for diabetes 10 months ago.   Review of Systems     Objective:   Physical Exam  Constitutional: He is oriented to person, place, and time. He appears well-developed and well-nourished. No distress.  HENT:  Head: Normocephalic.  Eyes: EOM are normal. Pupils are equal, round, and reactive to light.  Neck: Normal range of motion.  Pulmonary/Chest: Effort normal.  Musculoskeletal:       Right foot: There is tenderness, bony tenderness and swelling. There is normal range of motion, no crepitus, no deformity and no laceration.       Feet:  eccymosis base of nail, erythema and swelling, no purulence seen.  Severe onychomycosis of nails with large sharp edges.  Neurological: He is alert and oriented to person, place, and time. He has normal strength. No cranial nerve deficit or sensory deficit.   [facility] reading (PRIMARY) by  Dr.Gavina Dildine no fx seen, cystic changes distal ipj.  Results for orders placed or performed in visit on 04/22/15  POCT CBC  Result Value Ref Range   WBC 10.3 (A) 4.6 - 10.2 K/uL   Lymph, poc 2.9 0.6 - 3.4   POC LYMPH PERCENT 28.0 10 - 50 %L   MID (cbc) 0.7 0 - 0.9   POC MID % 6.7 0 - 12 %M   POC Granulocyte 6.7 2 - 6.9   Granulocyte percent 65.3 37 - 80 %G   RBC 5.61 4.69 - 6.13 M/uL   Hemoglobin 15.6 14.1 - 18.1 g/dL   HCT, POC 47.9 43.5 - 53.7 %   MCV 85.4 80 - 97 fL   MCH, POC 27.8 27 - 31.2 pg   MCHC 32.5 31.8 - 35.4 g/dL   RDW, POC 13.9 %   Platelet Count, POC 160 142 - 424 K/uL   MPV 7.8 0 - 99.8 fL  POCT glucose (manual entry)  Result Value  Ref Range   POC Glucose 195 (A) 70 - 99 mg/dl  POCT glycosylated hemoglobin (Hb A1C)  Result Value Ref Range   Hemoglobin A1C 9.1            Assessment & Plan:  Contusion toe/pain toe Onchomycosis diffuse Possible early cellulitis/Doxycycline

## 2015-04-22 NOTE — Patient Instructions (Addendum)
Diabetes and Exercise Exercising regularly is important. It is not just about losing weight. It has many health benefits, such as:  Improving your overall fitness, flexibility, and endurance.  Increasing your bone density.  Helping with weight control.  Decreasing your body fat.  Increasing your muscle strength.  Reducing stress and tension.  Improving your overall health. People with diabetes who exercise gain additional benefits because exercise:  Reduces appetite.  Improves the body's use of blood sugar (glucose).  Helps lower or control blood glucose.  Decreases blood pressure.  Helps control blood lipids (such as cholesterol and triglycerides).  Improves the body's use of the hormone insulin by:  Increasing the body's insulin sensitivity.  Reducing the body's insulin needs.  Decreases the risk for heart disease because exercising:  Lowers cholesterol and triglycerides levels.  Increases the levels of good cholesterol (such as high-density lipoproteins [HDL]) in the body.  Lowers blood glucose levels. YOUR ACTIVITY PLAN  Choose an activity that you enjoy and set realistic goals. Your health care provider or diabetes educator can help you make an activity plan that works for you. Exercise regularly as directed by your health care provider. This includes:  Performing resistance training twice a week such as push-ups, sit-ups, lifting weights, or using resistance bands.  Performing 150 minutes of cardio exercises each week such as walking, running, or playing sports.  Staying active and spending no more than 90 minutes at one time being inactive. Even short bursts of exercise are good for you. Three 10-minute sessions spread throughout the day are just as beneficial as a single 30-minute session. Some exercise ideas include:  Taking the dog for a walk.  Taking the stairs instead of the elevator.  Dancing to your favorite song.  Doing an exercise  video.  Doing your favorite exercise with a friend. RECOMMENDATIONS FOR EXERCISING WITH TYPE 1 OR TYPE 2 DIABETES   Check your blood glucose before exercising. If blood glucose levels are greater than 240 mg/dL, check for urine ketones. Do not exercise if ketones are present.  Avoid injecting insulin into areas of the body that are going to be exercised. For example, avoid injecting insulin into:  The arms when playing tennis.  The legs when jogging.  Keep a record of:  Food intake before and after you exercise.  Expected peak times of insulin action.  Blood glucose levels before and after you exercise.  The type and amount of exercise you have done.  Review your records with your health care provider. Your health care provider will help you to develop guidelines for adjusting food intake and insulin amounts before and after exercising.  If you take insulin or oral hypoglycemic agents, watch for signs and symptoms of hypoglycemia. They include:  Dizziness.  Shaking.  Sweating.  Chills.  Confusion.  Drink plenty of water while you exercise to prevent dehydration or heat stroke. Body water is lost during exercise and must be replaced.  Talk to your health care provider before starting an exercise program to make sure it is safe for you. Remember, almost any type of activity is better than none. Document Released: 01/09/2004 Document Revised: 03/05/2014 Document Reviewed: 03/28/2013 [facility] Patient Information 2015 [place], [place]. This information is not intended to replace advice given to you by your health care provider. Make sure you discuss any questions you have with your health care provider. Cellulitis Cellulitis is an infection of the skin and the tissue beneath it. The infected area is usually red and tender.  Cellulitis occurs most often in the arms and lower legs.  CAUSES  Cellulitis is caused by bacteria that enter the skin through cracks or cuts in the skin.  The most common types of bacteria that cause cellulitis are staphylococci and streptococci. SIGNS AND SYMPTOMS   Redness and warmth.  Swelling.  Tenderness or pain.  Fever. DIAGNOSIS  Your health care provider can usually determine what is wrong based on a physical exam. Blood tests may also be done. TREATMENT  Treatment usually involves taking an antibiotic medicine. HOME CARE INSTRUCTIONS   Take your antibiotic medicine as directed by your health care provider. Finish the antibiotic even if you start to feel better.  Keep the infected arm or leg elevated to reduce swelling.  Apply a warm cloth to the affected area up to 4 times per day to relieve pain.  Take medicines only as directed by your health care provider.  Keep all follow-up visits as directed by your health care provider. SEEK MEDICAL CARE IF:   You notice red streaks coming from the infected area.  Your red area gets larger or turns dark in color.  Your bone or joint underneath the infected area becomes painful after the skin has healed.  Your infection returns in the same area or another area.  You notice a swollen bump in the infected area.  You develop new symptoms.  You have a fever. SEEK IMMEDIATE MEDICAL CARE IF:   You feel very sleepy.  You develop vomiting or diarrhea.  You have a general ill feeling (malaise) with muscle aches and pains. MAKE SURE YOU:   Understand these instructions.  Will watch your condition.  Will get help right away if you are not doing well or get worse. Document Released: 07/29/2005 Document Revised: 03/05/2014 Document Reviewed: 01/04/2012 [facility] Patient Information 2015 [place], [place]. This information is not intended to replace advice given to you by your health care provider. Make sure you discuss any questions you have with your health care provider.

## 2015-05-16 ENCOUNTER — Ambulatory Visit (INDEPENDENT_AMBULATORY_CARE_PROVIDER_SITE_OTHER): Payer: 59 | Admitting: Urgent Care

## 2015-05-16 VITALS — BP 140/80 | HR 94 | Temp 97.9°F | Resp 18 | Ht 71.0 in | Wt 201.8 lb

## 2015-05-16 DIAGNOSIS — E119 Type 2 diabetes mellitus without complications: Secondary | ICD-10-CM | POA: Insufficient documentation

## 2015-05-16 DIAGNOSIS — N481 Balanitis: Secondary | ICD-10-CM | POA: Diagnosis not present

## 2015-05-16 MED ORDER — FLUCONAZOLE 150 MG PO TABS: 150.0000 mg | ORAL_TABLET | Freq: Once | ORAL | Status: DC

## 2015-05-16 NOTE — Patient Instructions (Addendum)
-   Take 1 Diflucan pill today and then again in 72 hours.   Balanitis Balanitis is inflammation of the head of the penis (glans).  CAUSES  Balanitis has multiple causes, both infectious and noninfectious. Often balanitis is the result of poor personal hygiene, especially in uncircumcised males. Without adequate washing, viruses, bacteria, and yeast collect between the foreskin and the glans. This can cause an infection. Lack of air and irritation from a normal secretion called smegma contribute to the cause in uncircumcised males. Other causes include:  Chemical irritation from the use of certain soaps and shower gels (especially soaps with perfumes), condoms, personal lubricants, petroleum jelly, spermicides, and fabric conditioners.  Skin conditions, such as eczema, dermatitis, and psoriasis.  Allergies to drugs, such as tetracycline and sulfa.  Certain medical conditions, including liver cirrhosis, congestive heart failure, and kidney disease.  Morbid obesity. RISK FACTORS  Diabetes mellitus.  A tight foreskin that is difficult to pull back past the glans (phimosis).  Sex without the use of a condom. SIGNS AND SYMPTOMS  Symptoms may include:  Discharge coming from under the foreskin.  Tenderness.  Itching and inability to get an erection (because of the pain).  Redness and a rash.  Sores on the glans and on the foreskin. DIAGNOSIS Diagnosis of balanitis is confirmed through a physical exam. TREATMENT The treatment is based on the cause of the balanitis. Treatment may include:  Frequent cleansing.  Keeping the glans and foreskin dry.  Use of medicines such as creams, pain medicines, antibiotics, or medicines to treat fungal infections.  Sitz baths. If the irritation has caused a scar on the foreskin that prevents easy retraction, a circumcision may be recommended.  HOME CARE INSTRUCTIONS  Sex should be avoided until the condition has cleared. MAKE SURE  YOU:  Understand these instructions.  Will watch your condition.  Will get help right away if you are not doing well or get worse. Document Released: 03/07/2009 Document Revised: 10/24/2013 Document Reviewed: 04/10/2013 [facility] Patient Information 2015 [place], [place]. This information is not intended to replace advice given to you by your health care provider. Make sure you discuss any questions you have with your health care provider.

## 2015-05-16 NOTE — Progress Notes (Signed)
    MRN: [id number] DOB: September 13, 1951  Subjective:   Jesse Wilcox is a 64 y.o. male with pmh of uncontrolled diabetes, Hep C presenting for chief complaint of penis issues  Reports ~3 year history of intermittent genital rash. Dr. Everlene Farrier initially prescribed ketoconazole cream for fungal infection. He has used this intermittently with good results but in the last 2-3 weeks, patient states that he's had burning sensation, redness of penile head, itching and white plaques. He has not had any relief with ketoconazole cream and has been using this twice daily. Patient denies sexual activity, cannot recall the last time he had sex and did use protection the last time he did have sex. Denies concern for STI. Denies fever, penile discharge, ulceration, genital rash, perirectal/anal pain, hematuria, dysuria. Of note, patient's diabetes is uncontrolled, his last A1c was 9.1 on 04/22/2015. Denies any other aggravating or relieving factors, no other questions or concerns.  Oshae has a current medication list which includes the following prescription(s): accu-chek softclix lancets, amlodipine, aspirin ec, atorvastatin, benazepril-hydrochlorthiazide, clopidogrel, cyclobenzaprine, doxycycline, glucose blood, insulin aspart, insulin glargine, insulin lispro, insulin pen needle, ketoconazole, metformin, multivitamin with minerals, and onetouch delica lancets fine. He has No Known Allergies.  Ehab  has a past medical history of Chronic hepatitis C; Diabetes mellitus; and Carotid artery occlusion. Also  has past surgical history that includes Aortic arch angiogram (04-15-12); carotid angiogram (N/A, 04/15/2012); arch aortogram (04/15/2012); and carotid stent insertion (N/A, 08/23/2012).  ROS As in subjective.  Objective:   Vitals: BP 140/80 mmHg  Pulse 94  Temp(Src) 97.9 F (36.6 C) (Oral)  Resp 18  Ht 5\' 11"  (1.803 m)  Wt 201 lb 12.8 oz (91.536 kg)  BMI 28.16 kg/m2  SpO2 97%  Physical Exam    Constitutional: He is oriented to person, place, and time. He appears well-developed and well-nourished.  Cardiovascular: Normal rate.   Pulmonary/Chest: Effort normal.  Genitourinary: Right testis shows no mass, no swelling and no tenderness. Left testis shows no mass, no swelling and no tenderness. Circumcised. Penile erythema and penile tenderness present. No phimosis, paraphimosis or hypospadias. No discharge found.     Lymphadenopathy: No inguinal adenopathy noted on the right or left side.  Neurological: He is alert and oriented to person, place, and time.  Skin: Skin is warm and dry. No rash noted. No erythema. No pallor.   Assessment and Plan :   1. Balanitis 2. Type 2 diabetes mellitus without complication - Likely undergoing fungal infection, will start diflucan x2 doses, patient declined STI testing today. Patient wanted to see Dr. Everlene Farrier, who was not here today, declined further treatment. I recommended he come back in 3-5 days if he is not having improvement, consider antibiotic treatment at that time.   [name], PA-C [facility] [phone] 05/16/2015 8:15 AM

## 2015-05-25 ENCOUNTER — Ambulatory Visit (INDEPENDENT_AMBULATORY_CARE_PROVIDER_SITE_OTHER): Payer: 59 | Admitting: Emergency Medicine

## 2015-05-25 ENCOUNTER — Encounter: Payer: Self-pay | Admitting: Emergency Medicine

## 2015-05-25 VITALS — BP 150/72 | HR 91 | Temp 97.8°F | Resp 18 | Ht 71.0 in | Wt 203.0 lb

## 2015-05-25 DIAGNOSIS — N481 Balanitis: Secondary | ICD-10-CM | POA: Diagnosis not present

## 2015-05-25 MED ORDER — MICONAZOLE POWD: Status: DC

## 2015-05-25 NOTE — Progress Notes (Deleted)
   Subjective:    Patient ID: Jesse Wilcox, male    DOB: 03-19-1951, 64 y.o.   MRN: [id number]  HPI    Review of Systems     Objective:   Physical Exam        Assessment & Plan:

## 2015-05-25 NOTE — Progress Notes (Addendum)
   Subjective:  This chart was scribed for Jesse Queen, MD by Jesse Wilcox, Medical Scribe. This patient was seen in Room 11 and the patient's care was started at 8:16 AM.   Patient ID: Jesse Wilcox, male    DOB: April 21, 1951, 64 y.o.   MRN: [id number]  HPI HPI Comments: Jesse Wilcox is a 64 y.o. male with a medical history of DM, HTN, and Hep C, who presents to [facility] for a follow up regarding a genital rash.  Pt was seen by Jesse Eagles, PA-C on 05/16/2015 for his ongoing balanitis where he noted that using the ketoconazole cream did not give him relief, and therefore he was prescribed diflucan for his condition. Today pt reports that the diflucan did give him a mild relief, however, he is not seeing the improvements he is looking for. Pt stopped using the cream and switched over to another cream that has been using that for the area.    Pt also presents her to get his blood sugar checked.   Review of Systems  Genitourinary: Positive for penile swelling and penile pain. Negative for discharge.      Objective:   Physical Exam  Constitutional: He is oriented to person, place, and time. He appears well-developed and well-nourished. No distress.  HENT:  Head: Normocephalic and atraumatic.  Eyes: EOM are normal. Pupils are equal, round, and reactive to light.  Neck: Neck supple.  Cardiovascular: Normal rate.   Pulmonary/Chest: Effort normal.  Genitourinary:  Significant redness and moisture around base of his glands and foreskin. No testicular swelling. No discharge.   Neurological: He is alert and oriented to person, place, and time. No cranial nerve deficit.  Skin: Skin is warm and dry.  Left great toenail has significant deformity and the area was trimmed with nail cutters.   Psychiatric: He has a normal mood and affect. His behavior is normal.  Nursing note and vitals reviewed.     Assessment & Plan:  Patient has continued redness and irritation  at the base of his glands in of the foreskin. This area is wet and moist. Will treat with [place] in powder and hopefully some drying will occur. He is to stop using his Neosporin. Recheck 1 week if continued problems.I personally performed the services described in this documentation, which was scribed in my presence. The recorded information has been reviewed and is accurate. Culture was done  Jesse Jordan, MD

## 2015-05-25 NOTE — Patient Instructions (Signed)
Balanitis  Balanitis is inflammation of the head of the penis (glans).   CAUSES   Balanitis has multiple causes, both infectious and noninfectious. Often balanitis is the result of poor personal hygiene, especially in uncircumcised males. Without adequate washing, viruses, bacteria, and yeast collect between the foreskin and the glans. This can cause an infection. Lack of air and irritation from a normal secretion called smegma contribute to the cause in uncircumcised males. Other causes include:   Chemical irritation from the use of certain soaps and shower gels (especially soaps with perfumes), condoms, personal lubricants, petroleum jelly, spermicides, and fabric conditioners.   Skin conditions, such as eczema, dermatitis, and psoriasis.   Allergies to drugs, such as tetracycline and sulfa.   Certain medical conditions, including liver cirrhosis, congestive heart failure, and kidney disease.   Morbid obesity.  RISK FACTORS   Diabetes mellitus.   A tight foreskin that is difficult to pull back past the glans (phimosis).   Sex without the use of a condom.  SIGNS AND SYMPTOMS   Symptoms may include:   Discharge coming from under the foreskin.   Tenderness.   Itching and inability to get an erection (because of the pain).   Redness and a rash.   Sores on the glans and on the foreskin.  DIAGNOSIS  Diagnosis of balanitis is confirmed through a physical exam.  TREATMENT  The treatment is based on the cause of the balanitis. Treatment may include:   Frequent cleansing.   Keeping the glans and foreskin dry.   Use of medicines such as creams, pain medicines, antibiotics, or medicines to treat fungal infections.   Sitz baths.  If the irritation has caused a scar on the foreskin that prevents easy retraction, a circumcision may be recommended.   HOME CARE INSTRUCTIONS   Sex should be avoided until the condition has cleared.  MAKE SURE YOU:   Understand these instructions.   Will watch your  condition.   Will get help right away if you are not doing well or get worse.  Document Released: 03/07/2009 Document Revised: 10/24/2013 Document Reviewed: 04/10/2013  [employer] Patient Information 2015 [employer]. This information is not intended to replace advice given to you by your health care provider. Make sure you discuss any questions you have with your health care provider.

## 2015-05-27 LAB — WOUND CULTURE
GRAM STAIN: NONE SEEN
GRAM STAIN: NONE SEEN
ORGANISM ID, BACTERIA: NO GROWTH

## 2015-06-06 ENCOUNTER — Encounter: Payer: Self-pay | Admitting: *Deleted

## 2015-08-06 ENCOUNTER — Encounter: Payer: Self-pay | Admitting: Emergency Medicine

## 2015-08-16 ENCOUNTER — Other Ambulatory Visit: Payer: Self-pay | Admitting: Emergency Medicine

## 2015-09-23 ENCOUNTER — Other Ambulatory Visit: Payer: Self-pay | Admitting: Emergency Medicine

## 2015-10-07 ENCOUNTER — Ambulatory Visit: Payer: 59 | Attending: Internal Medicine

## 2015-10-29 ENCOUNTER — Ambulatory Visit: Payer: 59 | Attending: Family Medicine | Admitting: Family Medicine

## 2015-10-29 ENCOUNTER — Encounter: Payer: Self-pay | Admitting: Family Medicine

## 2015-10-29 VITALS — BP 165/105 | HR 90 | Temp 98.2°F | Resp 13 | Ht 71.0 in | Wt 197.5 lb

## 2015-10-29 DIAGNOSIS — Z7982 Long term (current) use of aspirin: Secondary | ICD-10-CM | POA: Insufficient documentation

## 2015-10-29 DIAGNOSIS — Z79899 Other long term (current) drug therapy: Secondary | ICD-10-CM | POA: Diagnosis not present

## 2015-10-29 DIAGNOSIS — I10 Essential (primary) hypertension: Secondary | ICD-10-CM | POA: Diagnosis not present

## 2015-10-29 DIAGNOSIS — Z9119 Patient's noncompliance with other medical treatment and regimen: Secondary | ICD-10-CM | POA: Diagnosis not present

## 2015-10-29 DIAGNOSIS — B182 Chronic viral hepatitis C: Secondary | ICD-10-CM | POA: Diagnosis not present

## 2015-10-29 DIAGNOSIS — F17210 Nicotine dependence, cigarettes, uncomplicated: Secondary | ICD-10-CM | POA: Diagnosis not present

## 2015-10-29 DIAGNOSIS — E1165 Type 2 diabetes mellitus with hyperglycemia: Secondary | ICD-10-CM | POA: Diagnosis not present

## 2015-10-29 DIAGNOSIS — Z9114 Patient's other noncompliance with medication regimen: Secondary | ICD-10-CM | POA: Insufficient documentation

## 2015-10-29 DIAGNOSIS — E785 Hyperlipidemia, unspecified: Secondary | ICD-10-CM | POA: Insufficient documentation

## 2015-10-29 DIAGNOSIS — Z794 Long term (current) use of insulin: Secondary | ICD-10-CM | POA: Diagnosis not present

## 2015-10-29 DIAGNOSIS — Z720 Tobacco use: Secondary | ICD-10-CM | POA: Insufficient documentation

## 2015-10-29 DIAGNOSIS — E119 Type 2 diabetes mellitus without complications: Secondary | ICD-10-CM | POA: Diagnosis not present

## 2015-10-29 DIAGNOSIS — Z7984 Long term (current) use of oral hypoglycemic drugs: Secondary | ICD-10-CM | POA: Insufficient documentation

## 2015-10-29 LAB — GLUCOSE, POCT (MANUAL RESULT ENTRY): POC GLUCOSE: 283 mg/dL — AB (ref 70–99)

## 2015-10-29 LAB — POCT GLYCOSYLATED HEMOGLOBIN (HGB A1C): HEMOGLOBIN A1C: 12.6

## 2015-10-29 MED ORDER — ASPIRIN EC 81 MG PO TBEC: 81.0000 mg | DELAYED_RELEASE_TABLET | Freq: Every day | ORAL | Status: DC

## 2015-10-29 MED ORDER — METFORMIN HCL 500 MG PO TABS
1000.0000 mg | ORAL_TABLET | Freq: Two times a day (BID) | ORAL | Status: DC
Start: 2015-10-29 — End: 2016-04-21

## 2015-10-29 MED ORDER — BENAZEPRIL-HYDROCHLOROTHIAZIDE 20-12.5 MG PO TABS: 1.0000 | ORAL_TABLET | Freq: Every day | ORAL | Status: DC

## 2015-10-29 MED ORDER — ATORVASTATIN CALCIUM 20 MG PO TABS: 20.0000 mg | ORAL_TABLET | Freq: Every day | ORAL | Status: DC

## 2015-10-29 MED ORDER — INSULIN PEN NEEDLE 31G X 8 MM MISC: 1.0000 | Freq: Every day | Status: DC

## 2015-10-29 MED ORDER — INSULIN GLARGINE 100 UNIT/ML SOLOSTAR PEN: 40.0000 [IU] | PEN_INJECTOR | Freq: Every day | SUBCUTANEOUS | Status: DC

## 2015-10-29 MED ORDER — AMLODIPINE BESYLATE 10 MG PO TABS: 10.0000 mg | ORAL_TABLET | Freq: Every day | ORAL | Status: DC

## 2015-10-29 NOTE — Progress Notes (Signed)
Patient here to establish care to get medication refills He has not taken his HTN meds this am He states his Hep C has been treated He is uninsured and would like to use our pharmacy

## 2015-10-29 NOTE — Patient Instructions (Signed)
Diabetes Mellitus and Food It is important for you to manage your blood sugar (glucose) level. Your blood glucose level can be greatly affected by what you eat. Eating healthier foods in the appropriate amounts throughout the day at about the same time each day will help you control your blood glucose level. It can also help slow or prevent worsening of your diabetes mellitus. Healthy eating may even help you improve the level of your blood pressure and reach or maintain a healthy weight.  General recommendations for healthful eating and cooking habits include:  Eating meals and snacks regularly. Avoid going long periods of time without eating to lose weight.  Eating a diet that consists mainly of plant-based foods, such as fruits, vegetables, nuts, legumes, and whole grains.  Using low-heat cooking methods, such as baking, instead of high-heat cooking methods, such as deep frying. Work with your dietitian to make sure you understand how to use the Nutrition Facts information on food labels. HOW CAN FOOD AFFECT ME? Carbohydrates Carbohydrates affect your blood glucose level more than any other type of food. Your dietitian will help you determine how many carbohydrates to eat at each meal and teach you how to count carbohydrates. Counting carbohydrates is important to keep your blood glucose at a healthy level, especially if you are using insulin or taking certain medicines for diabetes mellitus. Alcohol Alcohol can cause sudden decreases in blood glucose (hypoglycemia), especially if you use insulin or take certain medicines for diabetes mellitus. Hypoglycemia can be a life-threatening condition. Symptoms of hypoglycemia (sleepiness, dizziness, and disorientation) are similar to symptoms of having too much alcohol.  If your health care provider has given you approval to drink alcohol, do so in moderation and use the following guidelines:  Women should not have more than one drink per day, and men  should not have more than two drinks per day. One drink is equal to:  12 oz of beer.  5 oz of wine.  1 oz of hard liquor.  Do not drink on an empty stomach.  Keep yourself hydrated. Have water, diet soda, or unsweetened iced tea.  Regular soda, juice, and other mixers might contain a lot of carbohydrates and should be counted. WHAT FOODS ARE NOT RECOMMENDED? As you make food choices, it is important to remember that all foods are not the same. Some foods have fewer nutrients per serving than other foods, even though they might have the same number of calories or carbohydrates. It is difficult to get your body what it needs when you eat foods with fewer nutrients. Examples of foods that you should avoid that are high in calories and carbohydrates but low in nutrients include:  Trans fats (most processed foods list trans fats on the Nutrition Facts label).  Regular soda.  Juice.  Candy.  Sweets, such as cake, pie, doughnuts, and cookies.  Fried foods. WHAT FOODS CAN I EAT? Eat nutrient-rich foods, which will nourish your body and keep you healthy. The food you should eat also will depend on several factors, including:  The calories you need.  The medicines you take.  Your weight.  Your blood glucose level.  Your blood pressure level.  Your cholesterol level. You should eat a variety of foods, including:  Protein.  Lean cuts of meat.  Proteins low in saturated fats, such as fish, egg whites, and beans. Avoid processed meats.  Fruits and vegetables.  Fruits and vegetables that may help control blood glucose levels, such as apples, mangoes, and   yams.  Dairy products.  Choose fat-free or low-fat dairy products, such as milk, yogurt, and cheese.  Grains, bread, pasta, and rice.  Choose whole grain products, such as multigrain bread, whole oats, and brown rice. These foods may help control blood pressure.  Fats.  Foods containing healthful fats, such as nuts,  avocado, olive oil, canola oil, and fish. DOES EVERYONE WITH DIABETES MELLITUS HAVE THE SAME MEAL PLAN? Because every person with diabetes mellitus is different, there is not one meal plan that works for everyone. It is very important that you meet with a dietitian who will help you create a meal plan that is just right for you.   This information is not intended to replace advice given to you by your health care provider. Make sure you discuss any questions you have with your health care provider.   Document Released: 07/16/2005 Document Revised: 11/09/2014 Document Reviewed: 09/15/2013 [employer] Interactive Patient Education 2016 [employer]  

## 2015-10-29 NOTE — Progress Notes (Signed)
Subjective:  Patient ID: Jesse Wilcox, male    DOB: 1951-02-16  Age: 64 y.o. MRN: [id number]  CC: Establish Care and Medication Refill   HPI Jesse Wilcox is a 64 year old male with a history of type 2 diabetes mellitus (uncontrolled with A1c of 12.6) hypertension, hyperlipidemia, tobacco abuse, non compliance who presents to establish care and the clinic today. He was previously followed by Dr Everlene Farrier of [place] but lost his insurance and has not been seen ever since and has also been off his medications hence elevated pressure.  He has not been compliant with exercise, ADA diet, low sodium diet. He continues to smoke a pack/day  and is not ready to quit at this time.  He has no additional complaints.  Past Medical History  Diagnosis Date  . Chronic hepatitis C ([place])   . Diabetes mellitus   . Carotid artery occlusion     Past Surgical History  Procedure Laterality Date  . Aortic arch angiogram  04-15-12    By Dr. Einar Gip  . Carotid angiogram N/A 04/15/2012    Procedure: CAROTID ANGIOGRAM;  Surgeon: Laverda Page, MD;  Location: [facility] CATH LAB;  Service: Cardiovascular;  Laterality: N/A;  . Arch aortogram  04/15/2012    Procedure: ARCH AORTOGRAM;  Surgeon: Laverda Page, MD;  Location: [facility] CATH LAB;  Service: Cardiovascular;;  . Carotid stent insertion N/A 08/23/2012    Procedure: CAROTID STENT INSERTION;  Surgeon: Elam Dutch, MD;  Location: [facility] CATH LAB;  Service: Cardiovascular;  Laterality: N/A;    No Known Allergies  Social History   Social History  . Marital Status: Single    Spouse Name: N/A  . Number of Children: N/A  . Years of Education: N/A   Occupational History  . Not on file.   Social History Main Topics  . Smoking status: Current Every Day Smoker -- 1.00 packs/day for 47 years    Types: Cigarettes  . Smokeless tobacco: Never Used  . Alcohol Use: No  . Drug Use: No  . Sexual Activity: Not on file   Other Topics Concern  . Not on file    Social History Narrative      Outpatient Prescriptions Prior to Visit  Medication Sig Dispense Refill  . ACCU-CHEK SOFTCLIX LANCETS lancets Use as instructed to check blood sugar 2 times per day dx code 250.00 100 each 2  . clopidogrel (PLAVIX) 75 MG tablet Take 1 tablet (75 mg total) by mouth daily. 90 tablet 3  . cyclobenzaprine (FLEXERIL) 5 MG tablet TAKE 1/2 TABLET BY MOUTH DAILY AT BEDTIME 45 tablet 3  . glucose blood (ONE TOUCH ULTRA TEST) test strip Use as instructed to check blood sugar 2 times per day dx code E11.9 100 each 11  . Multiple Vitamin (MULTIVITAMIN WITH MINERALS) TABS Take 1 tablet by mouth daily.    [name] DELICA LANCETS FINE MISC by Does not apply route. Use to check blood sugar 2 times per day dx code E11.9    . amLODipine (NORVASC) 10 MG tablet Take 1 tablet (10 mg total) by mouth daily. 90 tablet 3  . aspirin EC 81 MG tablet Take 81 mg by mouth daily.    Marland Kitchen atorvastatin (LIPITOR) 20 MG tablet Take 1 tablet (20 mg total) by mouth daily. 90 tablet 3  . benazepril-hydrochlorthiazide (LOTENSIN HCT) 20-12.5 MG per tablet Take 1 tablet by mouth daily. 90 tablet 3  . Insulin Glargine (LANTUS) 100 UNIT/ML Solostar Pen Inject 40 Units  into the skin daily. 60 pen 3  . metFORMIN (GLUCOPHAGE-XR) 500 MG 24 hr tablet Take 3 tablets (1,500 mg total) by mouth daily. Take with supper. NO MORE REFILLS WITHOUT OFFICE VISIT - 2ND NOTICE 45 tablet 0  . insulin aspart (NOVOLOG FLEXPEN) 100 UNIT/ML FlexPen Inject 8 Units into the skin 3 (three) times daily with meals. (Patient not taking: Reported on 10/29/2015) 15 mL 2  . insulin lispro (HUMALOG) 100 UNIT/ML KiwkPen Inject 0.08 mLs (8 Units total) into the skin 3 (three) times daily. (Patient not taking: Reported on 10/29/2015) 30 mL 2  . ketoconazole (NIZORAL) 2 % cream Apply topically daily. (Patient not taking: Reported on 10/29/2015) 75 g 3  . Miconazole POWD Applied to area of irritation around the foreskin twice daily (Patient  not taking: Reported on 10/29/2015) 1 Bottle 2  . doxycycline (VIBRA-TABS) 100 MG tablet Take 1 tablet (100 mg total) by mouth 2 (two) times daily. (Patient not taking: Reported on 05/25/2015) 20 tablet 0  . fluconazole (DIFLUCAN) 150 MG tablet Take 1 tablet (150 mg total) by mouth once. Repeat if needed (Patient not taking: Reported on 05/25/2015) 2 tablet 0  . Insulin Pen Needle 31G X 8 MM MISC Use as directed (Patient not taking: Reported on 10/29/2015) 100 each 10   No facility-administered medications prior to visit.    ROS Review of Systems  Constitutional: Negative for activity change and appetite change.  HENT: Negative for sinus pressure and sore throat.   Eyes: Negative for visual disturbance.  Respiratory: Negative for cough, chest tightness and shortness of breath.   Cardiovascular: Negative for chest pain and leg swelling.  Gastrointestinal: Negative for abdominal pain, diarrhea, constipation and abdominal distention.  Endocrine: Negative.   Genitourinary: Negative for dysuria.  Musculoskeletal: Negative for myalgias and joint swelling.  Skin: Negative for rash.  Allergic/Immunologic: Negative.   Neurological: Negative for weakness, light-headedness and numbness.  Psychiatric/Behavioral: Negative for suicidal ideas and dysphoric mood.    Objective:  BP 165/105 mmHg  Pulse 90  Temp(Src) 98.2 F (36.8 C)  Resp 13  Ht 5\' 11"  (1.803 m)  Wt 197 lb 8 oz (89.585 kg)  BMI 27.56 kg/m2  SpO2 96%  BP/Weight 10/29/2015 05/25/2015 123XX123  Systolic BP 123XX123 [id number]  Diastolic BP 123456 72 80  Wt. (Lbs) 197.5 203 201.8  BMI 27.56 28.33 28.16    Lab Results  Component Value Date   WBC 10.3* 04/22/2015   HGB 15.6 04/22/2015   HCT 47.9 04/22/2015   PLT 162 01/09/2009   GLUCOSE 193* 04/22/2015   CHOL 233* 08/08/2014   TRIG 234.0* 08/08/2014   HDL 29.10* 08/08/2014   LDLDIRECT 140.6 08/08/2014   ALT 56* 04/22/2015   AST 24 04/22/2015   NA 139 04/22/2015   K 4.1 04/22/2015    CL 107 04/22/2015   CREATININE 0.75 04/22/2015   BUN 11 04/22/2015   CO2 25 04/22/2015   TSH 1.320 01/30/2012   PSA 0.33 01/30/2012   INR 1.08 09/22/2013   HGBA1C 12.60 10/29/2015   MICROALBUR 0.9 08/08/2014    Physical Exam  Constitutional: He is oriented to person, place, and time. He appears well-developed and well-nourished.  HENT:  Head: Normocephalic and atraumatic.  Right Ear: External ear normal.  Left Ear: External ear normal.  Eyes: Conjunctivae and EOM are normal. Pupils are equal, round, and reactive to light.  Neck: Normal range of motion. Neck supple. No tracheal deviation present.  Cardiovascular: Normal rate, regular rhythm and normal heart  sounds.   No murmur heard. Pulmonary/Chest: Effort normal and breath sounds normal. No respiratory distress. He has no wheezes. He exhibits no tenderness.  Abdominal: Soft. Bowel sounds are normal. He exhibits no mass. There is no tenderness.  Musculoskeletal: Normal range of motion. He exhibits no edema or tenderness.  Neurological: He is alert and oriented to person, place, and time.  Skin: Skin is warm and dry.  Psychiatric: He has a normal mood and affect.     Assessment & Plan:   1. Type 2 diabetes mellitus without complication, unspecified long term insulin use status (HCC) Uncontrolled with A1c of 12.6. Compliance emphasized. His chart reveals he is on Lantus and NovoLog but the patient tells me he never took the NovoLog. I am refilling Lantus and increasing his dose of metformin; will review his blood sugar log at his next office visit and determine the need for NovoLog. Diabetic health care maintenance at next office visit along with fasting labs. - HgB A1c - Glucose (CBG) - Insulin Glargine (LANTUS) 100 UNIT/ML Solostar Pen; Inject 40 Units into the skin daily.  Dispense: 60 pen; Refill: 3 - aspirin EC 81 MG tablet; Take 1 tablet (81 mg total) by mouth daily.  Dispense: 30 tablet; Refill: 3 - metFORMIN  (GLUCOPHAGE) 500 MG tablet; Take 2 tablets (1,000 mg total) by mouth 2 (two) times daily with a meal.  Dispense: 120 tablet; Refill: 3 - Insulin Pen Needle 31G X 8 MM MISC; 1 each by Does not apply route at bedtime.  Dispense: 60 each; Refill: 11  2. Essential hypertension Uncontrolled due to noncompliance; meds refilled - amLODipine (NORVASC) 10 MG tablet; Take 1 tablet (10 mg total) by mouth daily.  Dispense: 30 tablet; Refill: 3 - benazepril-hydrochlorthiazide (LOTENSIN HCT) 20-12.5 MG tablet; Take 1 tablet by mouth daily.  Dispense: 30 tablet; Refill: 3  3. Chronic hepatitis C without hepatic coma (HCC) According to the patient he has completed therapy  4. Hyperlipidemia Uncontrolled. If labs are elevated at his next visit I will make no changes to regimen as he has been off his medications. - atorvastatin (LIPITOR) 20 MG tablet; Take 1 tablet (20 mg total) by mouth daily.  Dispense: 90 tablet; Refill: 3  5. Tobacco abuse Spent 3 minutes counseling on cessation and patient is not ready to quit.   Meds ordered this encounter  Medications  . Insulin Glargine (LANTUS) 100 UNIT/ML Solostar Pen    Sig: Inject 40 Units into the skin daily.    Dispense:  60 pen    Refill:  3  . amLODipine (NORVASC) 10 MG tablet    Sig: Take 1 tablet (10 mg total) by mouth daily.    Dispense:  30 tablet    Refill:  3  . benazepril-hydrochlorthiazide (LOTENSIN HCT) 20-12.5 MG tablet    Sig: Take 1 tablet by mouth daily.    Dispense:  30 tablet    Refill:  3  . atorvastatin (LIPITOR) 20 MG tablet    Sig: Take 1 tablet (20 mg total) by mouth daily.    Dispense:  90 tablet    Refill:  3  . aspirin EC 81 MG tablet    Sig: Take 1 tablet (81 mg total) by mouth daily.    Dispense:  30 tablet    Refill:  3  . metFORMIN (GLUCOPHAGE) 500 MG tablet    Sig: Take 2 tablets (1,000 mg total) by mouth 2 (two) times daily with a meal.    Dispense:  120 tablet    Refill:  3  . Insulin Pen Needle 31G X 8 MM  MISC    Sig: 1 each by Does not apply route at bedtime.    Dispense:  60 each    Refill:  11    Follow-up: 3 months   Arnoldo Morale MD

## 2015-10-30 ENCOUNTER — Other Ambulatory Visit: Payer: Self-pay | Admitting: Family Medicine

## 2015-10-30 MED ORDER — LISINOPRIL-HYDROCHLOROTHIAZIDE 20-12.5 MG PO TABS: 1.0000 | ORAL_TABLET | Freq: Every day | ORAL | Status: DC

## 2015-11-18 ENCOUNTER — Encounter: Payer: Self-pay | Admitting: Family Medicine

## 2015-11-18 ENCOUNTER — Ambulatory Visit: Payer: 59 | Attending: Family Medicine | Admitting: Family Medicine

## 2015-11-18 VITALS — BP 104/95 | HR 79 | Temp 97.9°F | Resp 13 | Ht 71.0 in | Wt 193.0 lb

## 2015-11-18 DIAGNOSIS — E785 Hyperlipidemia, unspecified: Secondary | ICD-10-CM | POA: Diagnosis not present

## 2015-11-18 DIAGNOSIS — Z720 Tobacco use: Secondary | ICD-10-CM

## 2015-11-18 DIAGNOSIS — E119 Type 2 diabetes mellitus without complications: Secondary | ICD-10-CM | POA: Diagnosis not present

## 2015-11-18 DIAGNOSIS — I10 Essential (primary) hypertension: Secondary | ICD-10-CM | POA: Diagnosis not present

## 2015-11-18 DIAGNOSIS — F17210 Nicotine dependence, cigarettes, uncomplicated: Secondary | ICD-10-CM | POA: Insufficient documentation

## 2015-11-18 DIAGNOSIS — Z7984 Long term (current) use of oral hypoglycemic drugs: Secondary | ICD-10-CM | POA: Insufficient documentation

## 2015-11-18 DIAGNOSIS — Z79899 Other long term (current) drug therapy: Secondary | ICD-10-CM | POA: Insufficient documentation

## 2015-11-18 DIAGNOSIS — Z794 Long term (current) use of insulin: Secondary | ICD-10-CM | POA: Insufficient documentation

## 2015-11-18 DIAGNOSIS — B182 Chronic viral hepatitis C: Secondary | ICD-10-CM | POA: Insufficient documentation

## 2015-11-18 DIAGNOSIS — Z7982 Long term (current) use of aspirin: Secondary | ICD-10-CM | POA: Insufficient documentation

## 2015-11-18 DIAGNOSIS — E1165 Type 2 diabetes mellitus with hyperglycemia: Secondary | ICD-10-CM | POA: Insufficient documentation

## 2015-11-18 LAB — GLUCOSE, POCT (MANUAL RESULT ENTRY): POC GLUCOSE: 212 mg/dL — AB (ref 70–99)

## 2015-11-18 MED ORDER — INSULIN GLARGINE 100 UNIT/ML SOLOSTAR PEN: 45.0000 [IU] | PEN_INJECTOR | Freq: Every day | SUBCUTANEOUS | Status: DC

## 2015-11-18 NOTE — Progress Notes (Signed)
Patient states he is here for blood work He states that he is taking everything on his medication list but is unable to recall names He did have coffee with equal and cream this am and has not had any medications He states he had his flu shot at [facility] hospital this year "early on."

## 2015-11-18 NOTE — Patient Instructions (Signed)
Diabetes Mellitus and Food It is important for you to manage your blood sugar (glucose) level. Your blood glucose level can be greatly affected by what you eat. Eating healthier foods in the appropriate amounts throughout the day at about the same time each day will help you control your blood glucose level. It can also help slow or prevent worsening of your diabetes mellitus. Healthy eating may even help you improve the level of your blood pressure and reach or maintain a healthy weight.  General recommendations for healthful eating and cooking habits include:  Eating meals and snacks regularly. Avoid going long periods of time without eating to lose weight.  Eating a diet that consists mainly of plant-based foods, such as fruits, vegetables, nuts, legumes, and whole grains.  Using low-heat cooking methods, such as baking, instead of high-heat cooking methods, such as deep frying. Work with your dietitian to make sure you understand how to use the Nutrition Facts information on food labels. HOW CAN FOOD AFFECT ME? Carbohydrates Carbohydrates affect your blood glucose level more than any other type of food. Your dietitian will help you determine how many carbohydrates to eat at each meal and teach you how to count carbohydrates. Counting carbohydrates is important to keep your blood glucose at a healthy level, especially if you are using insulin or taking certain medicines for diabetes mellitus. Alcohol Alcohol can cause sudden decreases in blood glucose (hypoglycemia), especially if you use insulin or take certain medicines for diabetes mellitus. Hypoglycemia can be a life-threatening condition. Symptoms of hypoglycemia (sleepiness, dizziness, and disorientation) are similar to symptoms of having too much alcohol.  If your health care provider has given you approval to drink alcohol, do so in moderation and use the following guidelines:  Women should not have more than one drink per day, and men  should not have more than two drinks per day. One drink is equal to:  12 oz of beer.  5 oz of wine.  1 oz of hard liquor.  Do not drink on an empty stomach.  Keep yourself hydrated. Have water, diet soda, or unsweetened iced tea.  Regular soda, juice, and other mixers might contain a lot of carbohydrates and should be counted. WHAT FOODS ARE NOT RECOMMENDED? As you make food choices, it is important to remember that all foods are not the same. Some foods have fewer nutrients per serving than other foods, even though they might have the same number of calories or carbohydrates. It is difficult to get your body what it needs when you eat foods with fewer nutrients. Examples of foods that you should avoid that are high in calories and carbohydrates but low in nutrients include:  Trans fats (most processed foods list trans fats on the Nutrition Facts label).  Regular soda.  Juice.  Candy.  Sweets, such as cake, pie, doughnuts, and cookies.  Fried foods. WHAT FOODS CAN I EAT? Eat nutrient-rich foods, which will nourish your body and keep you healthy. The food you should eat also will depend on several factors, including:  The calories you need.  The medicines you take.  Your weight.  Your blood glucose level.  Your blood pressure level.  Your cholesterol level. You should eat a variety of foods, including:  Protein.  Lean cuts of meat.  Proteins low in saturated fats, such as fish, egg whites, and beans. Avoid processed meats.  Fruits and vegetables.  Fruits and vegetables that may help control blood glucose levels, such as apples, mangoes, and   yams.  Dairy products.  Choose fat-free or low-fat dairy products, such as milk, yogurt, and cheese.  Grains, bread, pasta, and rice.  Choose whole grain products, such as multigrain bread, whole oats, and brown rice. These foods may help control blood pressure.  Fats.  Foods containing healthful fats, such as nuts,  avocado, olive oil, canola oil, and fish. DOES EVERYONE WITH DIABETES MELLITUS HAVE THE SAME MEAL PLAN? Because every person with diabetes mellitus is different, there is not one meal plan that works for everyone. It is very important that you meet with a dietitian who will help you create a meal plan that is just right for you.   This information is not intended to replace advice given to you by your health care provider. Make sure you discuss any questions you have with your health care provider.   Document Released: 07/16/2005 Document Revised: 11/09/2014 Document Reviewed: 09/15/2013 [employer] Interactive Patient Education 2016 [employer]  

## 2015-11-18 NOTE — Progress Notes (Signed)
CC: Follow-up on diabetes mellitus.  HPI: Jesse Wilcox is a 65 y.o. male with a history of uncontrolled type 2 diabetes mellitus (A1c 12.6 from 10/2015), hypertension, hyperlipidemia, hepatitis C who comes in for a follow-up visit. He had been admitted to being out of his medications his last office visit and so received a refill of Lantus and metformin and his blood sugar log today reveals fasting sugars in the 188-191 range. His med list reveals he is supposed to be on NovoLog which she has not been taking.  His blood pressure which was elevated at his last visit is much better today after resumption of his antihypertensives. He remains on statin for hyperlipidemia.  Patient has No headache, No chest pain, No abdominal pain - No Nausea, No new weakness tingling or numbness, No Cough - SOB.  No Known Allergies Past Medical History  Diagnosis Date  . Chronic hepatitis C ([place])   . Diabetes mellitus   . Carotid artery occlusion    Current Outpatient Prescriptions on File Prior to Visit  Medication Sig Dispense Refill  . ACCU-CHEK SOFTCLIX LANCETS lancets Use as instructed to check blood sugar 2 times per day dx code 250.00 100 each 2  . amLODipine (NORVASC) 10 MG tablet Take 1 tablet (10 mg total) by mouth daily. 30 tablet 3  . aspirin EC 81 MG tablet Take 1 tablet (81 mg total) by mouth daily. 30 tablet 3  . atorvastatin (LIPITOR) 20 MG tablet Take 1 tablet (20 mg total) by mouth daily. 90 tablet 3  . clopidogrel (PLAVIX) 75 MG tablet Take 1 tablet (75 mg total) by mouth daily. 90 tablet 3  . cyclobenzaprine (FLEXERIL) 5 MG tablet TAKE 1/2 TABLET BY MOUTH DAILY AT BEDTIME 45 tablet 3  . glucose blood (ONE TOUCH ULTRA TEST) test strip Use as instructed to check blood sugar 2 times per day dx code E11.9 100 each 11  . insulin aspart (NOVOLOG FLEXPEN) 100 UNIT/ML FlexPen Inject 8 Units into the skin 3 (three) times daily with meals. 15 mL 2  . Insulin Glargine (LANTUS) 100 UNIT/ML  Solostar Pen Inject 40 Units into the skin daily. 60 pen 3  . insulin lispro (HUMALOG) 100 UNIT/ML KiwkPen Inject 0.08 mLs (8 Units total) into the skin 3 (three) times daily. 30 mL 2  . Insulin Pen Needle 31G X 8 MM MISC 1 each by Does not apply route at bedtime. 60 each 11  . ketoconazole (NIZORAL) 2 % cream Apply topically daily. 75 g 3  . lisinopril-hydrochlorothiazide (ZESTORETIC) 20-12.5 MG tablet Take 1 tablet by mouth daily. 30 tablet 3  . metFORMIN (GLUCOPHAGE) 500 MG tablet Take 2 tablets (1,000 mg total) by mouth 2 (two) times daily with a meal. 120 tablet 3  . Miconazole POWD Applied to area of irritation around the foreskin twice daily 1 Bottle 2  . Multiple Vitamin (MULTIVITAMIN WITH MINERALS) TABS Take 1 tablet by mouth daily.    Jesse Wilcox DELICA LANCETS FINE MISC by Does not apply route. Use to check blood sugar 2 times per day dx code E11.9     No current facility-administered medications on file prior to visit.   Family History  Problem Relation Age of Onset  . Diabetes Mother   . Heart disease Mother   . Hypertension Mother   . Heart attack Mother   . Diabetes Father   . Heart disease Father   . Hypertension Father   . Diabetes Sister   . Hyperlipidemia Sister   .  Heart disease Sister   . Heart attack Sister   . Peripheral vascular disease Sister   . Hyperlipidemia Brother   . Hypertension Brother    Social History   Social History  . Marital Status: Single    Spouse Name: N/A  . Number of Children: N/A  . Years of Education: N/A   Occupational History  . Not on file.   Social History Main Topics  . Smoking status: Current Every Day Smoker -- 1.00 packs/day for 47 years    Types: Cigarettes  . Smokeless tobacco: Never Used  . Alcohol Use: No  . Drug Use: No  . Sexual Activity: Not on file   Other Topics Concern  . Not on file   Social History Narrative    Review of Systems: Constitutional: Negative for fever, chills, diaphoresis, activity  change, appetite change and fatigue. HENT: Negative for ear pain, nosebleeds, congestion, facial swelling, rhinorrhea, neck pain, neck stiffness and ear discharge.  Eyes: Negative for pain, discharge, redness, itching and visual disturbance. Respiratory: Negative for cough, choking, chest tightness, shortness of breath, wheezing and stridor.  Cardiovascular: Negative for chest pain, palpitations and leg swelling. Gastrointestinal: Negative for abdominal distention. Genitourinary: Negative for dysuria, urgency, frequency, hematuria, flank pain, decreased urine volume, difficulty urinating and dyspareunia.  Musculoskeletal: Negative for back pain, joint swelling, arthralgias and gait problem. Neurological: Negative for dizziness, tremors, seizures, syncope, facial asymmetry, speech difficulty, weakness, light-headedness, numbness and headaches.  Hematological: Negative for adenopathy. Does not bruise/bleed easily. Psychiatric/Behavioral: Negative for hallucinations, behavioral problems, confusion, dysphoric mood, decreased concentration and agitation.    Objective:   Filed Vitals:   11/18/15 0907  BP: 104/95  Pulse: 79  Temp: 97.9 F (36.6 C)  Resp: 13    Physical Exam: Constitutional: Patient appears well-developed and well-nourished. No distress. HENT: Normocephalic, atraumatic, External right and left ear normal. Oropharynx is clear and moist.  Eyes: Conjunctivae and EOM are normal. PERRLA, no scleral icterus. Neck: Normal ROM. Neck supple. No JVD. No tracheal deviation. No thyromegaly. CVS: RRR, S1/S2 +, no murmurs, no gallops, no carotid bruit.  Pulmonary: Effort and breath sounds normal, no stridor, rhonchi, wheezes, rales.  Abdominal: Soft. BS +,  no distension, tenderness, rebound or guarding.  Musculoskeletal: Normal range of motion. No edema and no tenderness.  Lymphadenopathy: No lymphadenopathy noted, cervical, inguinal or axillary Neuro: Alert. Normal reflexes, muscle  tone coordination. No cranial nerve deficit. Skin: Skin is warm and dry. No rash noted. Not diaphoretic. No erythema. No pallor. Psychiatric: Normal mood and affect. Behavior, judgment, thought content normal.  Lab Results  Component Value Date   WBC 10.3* 04/22/2015   HGB 15.6 04/22/2015   HCT 47.9 04/22/2015   MCV 85.4 04/22/2015   PLT 162 01/09/2009   Lab Results  Component Value Date   CREATININE 0.75 04/22/2015   BUN 11 04/22/2015   NA 139 04/22/2015   K 4.1 04/22/2015   CL 107 04/22/2015   CO2 25 04/22/2015    Lab Results  Component Value Date   HGBA1C 12.60 10/29/2015   Lipid Panel     Component Value Date/Time   CHOL 233* 08/08/2014 1024   TRIG 234.0* 08/08/2014 1024   HDL 29.10* 08/08/2014 1024   CHOLHDL 8 08/08/2014 1024   VLDL 46.8* 08/08/2014 1024       Assessment and plan:  1. Type 2 diabetes mellitus without complication, unspecified long term insulin use status (HCC) Uncontrolled with A1c of 12.6. Compliance emphasized. Fasting sugars still in  the 190s and so i have increased his Lantus from 40 units to 45 units. We will review blood sugar log at his next visit and initiate Lantus if needed. Foot exam performed today, advised on no eye exam.  2. Essential hypertension Controlled - amLODipine (NORVASC) 10 MG tablet; Take 1 tablet (10 mg total) by mouth daily.  Dispense: 30 tablet; Refill: 3 - benazepril-hydrochlorthiazide (LOTENSIN HCT) 20-12.5 MG tablet; Take 1 tablet by mouth daily.  Dispense: 30 tablet; Refill: 3  3. Chronic hepatitis C without hepatic coma (HCC) According to the patient he has completed therapy  4. Hyperlipidemia Uncontrolled. If labs are elevated at his next visit I will make no changes to regimen as he has been off his medications. - atorvastatin (LIPITOR) 20 MG tablet; Take 1 tablet (20 mg total) by mouth daily.  Dispense: 90 tablet; Refill: 3  5. Tobacco abuse Spent 3 minutes counseling on cessation and patient is not  ready to quit.        Arnoldo Morale, [facility] [phone] 11/18/2015, 9:17 AM

## 2015-12-02 MED FILL — LISINOPRIL-HCTZ 20-12.5 MG: 20-12.5 | 30 days supply | Qty: 30 | Fill #1

## 2015-12-02 MED FILL — LANTUS SOLOSTAR 100 UNITS/M: 100 | 38 days supply | Qty: 15 | Fill #1

## 2015-12-02 MED FILL — ?ATORVASTATIN 20 MG TABLET: 20 | 30 days supply | Qty: 30 | Fill #1

## 2015-12-02 MED FILL — metFORMIN HCL 500 MG TABS: 500 | 30 days supply | Qty: 120 | Fill #1

## 2015-12-09 ENCOUNTER — Ambulatory Visit: Payer: 59 | Attending: Podiatry

## 2015-12-09 ENCOUNTER — Other Ambulatory Visit: Payer: Self-pay | Admitting: *Deleted

## 2015-12-09 ENCOUNTER — Telehealth: Payer: Self-pay | Admitting: Family Medicine

## 2015-12-09 ENCOUNTER — Other Ambulatory Visit: Payer: Self-pay | Admitting: Pharmacist

## 2015-12-09 MED ORDER — TRUEPLUS LANCETS 28G MISC: Status: DC

## 2015-12-09 MED ORDER — GLUCOSE BLOOD VI STRP: ORAL_STRIP | Status: DC

## 2015-12-09 MED ORDER — TRUE METRIX METER W/DEVICE KIT: PACK | Status: AC

## 2015-12-09 NOTE — Telephone Encounter (Signed)
Called asking for refills on his test strips. Refill sent to our pharmacy.

## 2015-12-10 MED FILL — TRUE METRIX TEST STRIP: 25 days supply | Qty: 100 | Fill #0

## 2015-12-10 MED FILL — !TRUE METRIX BLOOD GLUCOSE: 365 days supply | Qty: 1 | Fill #0

## 2015-12-17 ENCOUNTER — Other Ambulatory Visit: Payer: Self-pay | Admitting: Emergency Medicine

## 2015-12-17 ENCOUNTER — Other Ambulatory Visit: Payer: Self-pay | Admitting: Family Medicine

## 2015-12-17 DIAGNOSIS — I2510 Atherosclerotic heart disease of native coronary artery without angina pectoris: Secondary | ICD-10-CM

## 2015-12-17 MED ORDER — CLOPIDOGREL BISULFATE 75 MG PO TABS: 75.0000 mg | ORAL_TABLET | Freq: Every day | ORAL | Status: DC

## 2015-12-18 MED FILL — CLOPIDOGREL 75 MG TABLET: 75 | 30 days supply | Qty: 30 | Fill #0

## 2015-12-26 MED FILL — AMLODIPINE BESYLATE 10 MG T: 10 | 30 days supply | Qty: 30 | Fill #1

## 2016-01-01 ENCOUNTER — Ambulatory Visit: Payer: BLUE CROSS/BLUE SHIELD | Attending: Family Medicine | Admitting: Family Medicine

## 2016-01-01 ENCOUNTER — Encounter: Payer: Self-pay | Admitting: Family Medicine

## 2016-01-01 VITALS — BP 148/87 | HR 87 | Temp 97.8°F | Resp 15 | Ht 71.0 in | Wt 196.2 lb

## 2016-01-01 DIAGNOSIS — Z7984 Long term (current) use of oral hypoglycemic drugs: Secondary | ICD-10-CM | POA: Insufficient documentation

## 2016-01-01 DIAGNOSIS — Z716 Tobacco abuse counseling: Secondary | ICD-10-CM | POA: Insufficient documentation

## 2016-01-01 DIAGNOSIS — Z7982 Long term (current) use of aspirin: Secondary | ICD-10-CM | POA: Diagnosis not present

## 2016-01-01 DIAGNOSIS — E1165 Type 2 diabetes mellitus with hyperglycemia: Secondary | ICD-10-CM | POA: Diagnosis present

## 2016-01-01 DIAGNOSIS — I6529 Occlusion and stenosis of unspecified carotid artery: Secondary | ICD-10-CM | POA: Insufficient documentation

## 2016-01-01 DIAGNOSIS — E785 Hyperlipidemia, unspecified: Secondary | ICD-10-CM

## 2016-01-01 DIAGNOSIS — Z955 Presence of coronary angioplasty implant and graft: Secondary | ICD-10-CM | POA: Insufficient documentation

## 2016-01-01 DIAGNOSIS — I10 Essential (primary) hypertension: Secondary | ICD-10-CM | POA: Diagnosis not present

## 2016-01-01 DIAGNOSIS — Z794 Long term (current) use of insulin: Secondary | ICD-10-CM | POA: Insufficient documentation

## 2016-01-01 DIAGNOSIS — F17210 Nicotine dependence, cigarettes, uncomplicated: Secondary | ICD-10-CM | POA: Diagnosis not present

## 2016-01-01 DIAGNOSIS — B182 Chronic viral hepatitis C: Secondary | ICD-10-CM | POA: Diagnosis not present

## 2016-01-01 DIAGNOSIS — Z720 Tobacco use: Secondary | ICD-10-CM

## 2016-01-01 DIAGNOSIS — E119 Type 2 diabetes mellitus without complications: Secondary | ICD-10-CM | POA: Diagnosis not present

## 2016-01-01 DIAGNOSIS — Z79899 Other long term (current) drug therapy: Secondary | ICD-10-CM | POA: Diagnosis not present

## 2016-01-01 MED ORDER — INSULIN GLARGINE 100 UNIT/ML SOLOSTAR PEN: 50.0000 [IU] | PEN_INJECTOR | Freq: Every day | SUBCUTANEOUS | Status: DC

## 2016-01-01 MED FILL — LANTUS SOLOSTAR 100 UNITS/M: 100 | 30 days supply | Qty: 15 | Fill #0

## 2016-01-01 NOTE — Progress Notes (Signed)
Subjective:    Patient ID: Jesse Wilcox, male    DOB: 03-16-1951, 65 y.o.   MRN: [id number]  HPI Jesse Wilcox  Is a 65 year old male with a history of uncontrolled type 2 diabetes mellitus (A1c 12.6 from 10/2015), hypertension, hyperlipidemia, hepatitis C who comes in for a follow-up visit.  Lantus was increased at his last office visit from 40 units to 45 units and he comes in here with his fasting blood sugar log which reveals sugars in the 115 to 188 range; he has not been checking random blood sugars and has been advised to check his sugars 3 times daily going forward. He also has not been taking his Humalog.  He has been compliant with his antihypertensive and with his statin (was previously noncompliant with statin hent elevated lipid panel). He admits to not exercising regularly and has not been eating a low-sodium diet which he is determined to work on.  He has no complaints at this time.  Past Medical History  Diagnosis Date  . Chronic hepatitis C ([place])   . Diabetes mellitus   . Carotid artery occlusion     Past Surgical History  Procedure Laterality Date  . Aortic arch angiogram  04-15-12    By Dr. Einar Gip  . Carotid angiogram N/A 04/15/2012    Procedure: CAROTID ANGIOGRAM;  Surgeon: Laverda Page, MD;  Location: [facility] CATH LAB;  Service: Cardiovascular;  Laterality: N/A;  . Arch aortogram  04/15/2012    Procedure: ARCH AORTOGRAM;  Surgeon: Laverda Page, MD;  Location: [facility] CATH LAB;  Service: Cardiovascular;;  . Carotid stent insertion N/A 08/23/2012    Procedure: CAROTID STENT INSERTION;  Surgeon: Elam Dutch, MD;  Location: [facility] CATH LAB;  Service: Cardiovascular;  Laterality: N/A;    Social History   Social History  . Marital Status: Single    Spouse Name: N/A  . Number of Children: N/A  . Years of Education: N/A   Occupational History  . Not on file.   Social History Main Topics  . Smoking status: Current Every Day Smoker -- 1.00 packs/day for  47 years    Types: Cigarettes  . Smokeless tobacco: Never Used  . Alcohol Use: No  . Drug Use: No  . Sexual Activity: Not on file   Other Topics Concern  . Not on file   Social History Narrative    No Known Allergies  Current Outpatient Prescriptions on File Prior to Visit  Medication Sig Dispense Refill  . amLODipine (NORVASC) 10 MG tablet Take 1 tablet (10 mg total) by mouth daily. 30 tablet 3  . aspirin EC 81 MG tablet Take 1 tablet (81 mg total) by mouth daily. 30 tablet 3  . atorvastatin (LIPITOR) 20 MG tablet Take 1 tablet (20 mg total) by mouth daily. 90 tablet 3  . Blood Glucose Monitoring Suppl (TRUE METRIX METER) w/Device KIT Use to check blood sugar as directed 1 kit 0  . clopidogrel (PLAVIX) 75 MG tablet Take 1 tablet (75 mg total) by mouth daily. 30 tablet 3  . cyclobenzaprine (FLEXERIL) 5 MG tablet TAKE 1/2 TABLET BY MOUTH DAILY AT BEDTIME 45 tablet 3  . glucose blood (TRUE METRIX BLOOD GLUCOSE TEST) test strip Use as instructed 100 each 12  . insulin aspart (NOVOLOG FLEXPEN) 100 UNIT/ML FlexPen Inject 8 Units into the skin 3 (three) times daily with meals. 15 mL 2  . insulin lispro (HUMALOG) 100 UNIT/ML KiwkPen Inject 0.08 mLs (8 Units total)  into the skin 3 (three) times daily. 30 mL 2  . Insulin Pen Needle 31G X 8 MM MISC 1 each by Does not apply route at bedtime. 60 each 11  . ketoconazole (NIZORAL) 2 % cream Apply topically daily. 75 g 3  . lisinopril-hydrochlorothiazide (ZESTORETIC) 20-12.5 MG tablet Take 1 tablet by mouth daily. 30 tablet 3  . metFORMIN (GLUCOPHAGE) 500 MG tablet Take 2 tablets (1,000 mg total) by mouth 2 (two) times daily with a meal. 120 tablet 3  . Miconazole POWD Applied to area of irritation around the foreskin twice daily 1 Bottle 2  . Multiple Vitamin (MULTIVITAMIN WITH MINERALS) TABS Take 1 tablet by mouth daily.    . TRUEPLUS LANCETS 28G MISC Use as directed 100 each 12   No current facility-administered medications on file prior to  visit.      Review of Systems Constitutional: Negative for fever, chills, diaphoresis, activity change, appetite change and fatigue. HENT: Negative for ear pain, nosebleeds, congestion, facial swelling, rhinorrhea, neck pain, neck stiffness and ear discharge.  Eyes: Negative for pain, discharge, redness, itching and visual disturbance. Respiratory: Negative for cough, choking, chest tightness, shortness of breath, wheezing and stridor.  Cardiovascular: Negative for chest pain, palpitations and leg swelling. Gastrointestinal: Negative for abdominal distention. Genitourinary: Negative for dysuria, urgency, frequency, hematuria, flank pain, decreased urine volume, difficulty urinating and dyspareunia.  Musculoskeletal: Negative for back pain, joint swelling, arthralgias and gait problem. Neurological: Negative for dizziness, tremors, seizures, syncope, facial asymmetry, speech difficulty, weakness, light-headedness, numbness and headaches.  Hematological: Negative for adenopathy. Does not bruise/bleed easily. Psychiatric/Behavioral: Negative for hallucinations, behavioral problems, confusion, dysphoric mood, decreased concentration and agitation.    Objective: Filed Vitals:   01/01/16 0916  BP: 148/87  Pulse: 87  Temp: 97.8 F (36.6 C)  Resp: 15  Height: _0  (1.803 m)  Weight: 196 lb 3.2 oz (88.996 kg)  SpO2: 96%      Physical Exam  Constitutional: Patient appears well-developed and well-nourished. No distress. CVS: RRR, S1/S2 +, no murmurs, no gallops, no carotid bruit.  Pulmonary: Effort and breath sounds normal, no stridor, rhonchi, wheezes, rales.  Abdominal: Soft. BS +,  no distension, tenderness, rebound or guarding.  Musculoskeletal: Normal range of motion. No edema and no tenderness.  Lymphadenopathy: No lymphadenopathy noted, cervical, inguinal or axillary Neuro: Alert. Normal reflexes, muscle tone coordination. No cranial nerve deficit. Skin: Skin is warm and dry. No  rash noted. Not diaphoretic. No erythema. No pallor. Psychiatric: Normal mood and affect. Behavior, judgment, thought content normal.      CMP Latest Ref Rng 04/22/2015 08/08/2014 03/16/2014  Glucose 70 - 99 mg/dL 193(H) 120(H) 145(H)  BUN 6 - 23 mg/dL _1 Creatinine 0.50 - 1.35 mg/dL 0.75 0.8 0.85  Sodium 135 - 145 mEq/L 139 136 135  Potassium 3.5 - 5.3 mEq/L 4.1 4.0 4.2  Chloride 96 - 112 mEq/L 107 102 98  CO2 19 - 32 mEq/L _2 Calcium 8.4 - 10.5 mg/dL 9.6 9.7 10.1  Total Protein 6.0 - 8.3 g/dL 7.6 8.5(H) 7.9  Total Bilirubin 0.2 - 1.2 mg/dL 0.8 0.7 1.0  Alkaline Phos 39 - 117 U/L 100 85 100  AST 0 - 37 U/L _3 ALT 0 - 53 U/L 56(H) 37 41     Lipid Panel     Component Value Date/Time   CHOL 233* 08/08/2014 1024   TRIG 234.0* 08/08/2014 1024   HDL 29.10* 08/08/2014 1024  CHOLHDL 8 08/08/2014 1024   VLDL 46.8* 08/08/2014 1024   LDLDIRECT 140.6 08/08/2014 1024     Assessment & Plan:  1. Type 2 diabetes mellitus without complication, unspecified long term insulin use status (HCC) Uncontrolled with A1c of 12.6. Compliance emphasized. Fasting sugars still in the 190s and so i have increased his Lantus from 45 units to 50 units. We will review blood sugar log at his next visit will restart Humalog if needed; he is not taking Humalog at this time. Keep blood sugar logs three times daily with fasting goals of 80-120 mg/dl, random of less than 180 and in the event of sugars less than 60 mg/dl or greater than 400 mg/dl please notify the clinic ASAP. It is recommended that you undergo annual eye exams and annual foot exams. Pneumovax is recommended every 5 years before the age of 24 and once for a lifetime at or after the age of 47..  2. Essential hypertension BP is mildly elevated Low-sodium, DASH diet - amLODipine (NORVASC) 10 MG tablet; Take 1 tablet (10 mg total) by mouth daily.  Dispense: 30 tablet; Refill: 3 - benazepril-hydrochlorthiazide (LOTENSIN HCT)  20-12.5 MG tablet; Take 1 tablet by mouth daily.  Dispense: 30 tablet; Refill: 3  3. Chronic hepatitis C without hepatic coma (HCC) According to the patient he has completed therapy  4. Hyperlipidemia Uncontrolled. Due to noncompliance previously but now he has committed to being more compliant Advised on low-cholesterol diet and exercise. - atorvastatin (LIPITOR) 20 MG tablet; Take 1 tablet (20 mg total) by mouth daily.  Dispense: 90 tablet; Refill: 3  5. Tobacco abuse Spent 3 minutes counseling on cessation and patient is not ready to quit.

## 2016-01-01 NOTE — Progress Notes (Signed)
Patient here for follow up Reports no issues Sis not take meds this am and is fasting

## 2016-01-01 NOTE — Patient Instructions (Signed)
Diabetes Mellitus and Food It is important for you to manage your blood sugar (glucose) level. Your blood glucose level can be greatly affected by what you eat. Eating healthier foods in the appropriate amounts throughout the day at about the same time each day will help you control your blood glucose level. It can also help slow or prevent worsening of your diabetes mellitus. Healthy eating may even help you improve the level of your blood pressure and reach or maintain a healthy weight.  General recommendations for healthful eating and cooking habits include:  Eating meals and snacks regularly. Avoid going long periods of time without eating to lose weight.  Eating a diet that consists mainly of plant-based foods, such as fruits, vegetables, nuts, legumes, and whole grains.  Using low-heat cooking methods, such as baking, instead of high-heat cooking methods, such as deep frying. Work with your dietitian to make sure you understand how to use the Nutrition Facts information on food labels. HOW CAN FOOD AFFECT ME? Carbohydrates Carbohydrates affect your blood glucose level more than any other type of food. Your dietitian will help you determine how many carbohydrates to eat at each meal and teach you how to count carbohydrates. Counting carbohydrates is important to keep your blood glucose at a healthy level, especially if you are using insulin or taking certain medicines for diabetes mellitus. Alcohol Alcohol can cause sudden decreases in blood glucose (hypoglycemia), especially if you use insulin or take certain medicines for diabetes mellitus. Hypoglycemia can be a life-threatening condition. Symptoms of hypoglycemia (sleepiness, dizziness, and disorientation) are similar to symptoms of having too much alcohol.  If your health care provider has given you approval to drink alcohol, do so in moderation and use the following guidelines:  Women should not have more than one drink per day, and men  should not have more than two drinks per day. One drink is equal to:  12 oz of beer.  5 oz of wine.  1 oz of hard liquor.  Do not drink on an empty stomach.  Keep yourself hydrated. Have water, diet soda, or unsweetened iced tea.  Regular soda, juice, and other mixers might contain a lot of carbohydrates and should be counted. WHAT FOODS ARE NOT RECOMMENDED? As you make food choices, it is important to remember that all foods are not the same. Some foods have fewer nutrients per serving than other foods, even though they might have the same number of calories or carbohydrates. It is difficult to get your body what it needs when you eat foods with fewer nutrients. Examples of foods that you should avoid that are high in calories and carbohydrates but low in nutrients include:  Trans fats (most processed foods list trans fats on the Nutrition Facts label).  Regular soda.  Juice.  Candy.  Sweets, such as cake, pie, doughnuts, and cookies.  Fried foods. WHAT FOODS CAN I EAT? Eat nutrient-rich foods, which will nourish your body and keep you healthy. The food you should eat also will depend on several factors, including:  The calories you need.  The medicines you take.  Your weight.  Your blood glucose level.  Your blood pressure level.  Your cholesterol level. You should eat a variety of foods, including:  Protein.  Lean cuts of meat.  Proteins low in saturated fats, such as fish, egg whites, and beans. Avoid processed meats.  Fruits and vegetables.  Fruits and vegetables that may help control blood glucose levels, such as apples, mangoes, and   yams.  Dairy products.  Choose fat-free or low-fat dairy products, such as milk, yogurt, and cheese.  Grains, bread, pasta, and rice.  Choose whole grain products, such as multigrain bread, whole oats, and brown rice. These foods may help control blood pressure.  Fats.  Foods containing healthful fats, such as nuts,  avocado, olive oil, canola oil, and fish. DOES EVERYONE WITH DIABETES MELLITUS HAVE THE SAME MEAL PLAN? Because every person with diabetes mellitus is different, there is not one meal plan that works for everyone. It is very important that you meet with a dietitian who will help you create a meal plan that is just right for you.   This information is not intended to replace advice given to you by your health care provider. Make sure you discuss any questions you have with your health care provider.   Document Released: 07/16/2005 Document Revised: 11/09/2014 Document Reviewed: 09/15/2013 [employer] Interactive Patient Education 2016 [employer]  

## 2016-01-02 ENCOUNTER — Encounter: Payer: Self-pay | Admitting: Clinical

## 2016-01-02 NOTE — Progress Notes (Signed)
Depression screen [facility] 2/9 01/01/2016 11/18/2015 10/29/2015 05/25/2015 05/16/2015  Decreased Interest 1 0 0 0 0  Down, Depressed, Hopeless 1 0 0 0 0  PHQ - 2 Score 2 0 0 0 0  Altered sleeping 0 - - - -  Tired, decreased energy 1 - - - -  Change in appetite 0 - - - -  Feeling bad or failure about yourself  1 - - - -  Trouble concentrating 1 - - - -  Moving slowly or fidgety/restless 0 - - - -  Suicidal thoughts 0 - - - -  PHQ-9 Score 5 - - - -    GAD 7 : Generalized Anxiety Score 01/01/2016  Nervous, Anxious, on Edge 0  Control/stop worrying 1  Worry too much - different things 2  Trouble relaxing 1  Restless 0  Easily annoyed or irritable 0  Afraid - awful might happen 0  Total GAD 7 Score 4

## 2016-01-16 MED FILL — ?METFORMIN HCL 500MG TABLET: 500 | 30 days supply | Qty: 120 | Fill #2

## 2016-01-16 MED FILL — LISINOPRIL-HCTZ 20-12.5 MG: 20-12.5 | 30 days supply | Qty: 30 | Fill #2

## 2016-01-16 MED FILL — ?ATORVASTATIN 20 MG TABLET: 20 | 30 days supply | Qty: 30 | Fill #2

## 2016-01-31 MED FILL — CLOPIDOGREL 75 MG TABLET: 75 | 30 days supply | Qty: 30 | Fill #1

## 2016-01-31 MED FILL — CYCLOBENZAPRINE 5 MG TABLET: 5 | 30 days supply | Qty: 15 | Fill #1

## 2016-02-12 MED FILL — AMLODIPINE BESYLATE 10 MG T: 10 | 30 days supply | Qty: 30 | Fill #2

## 2016-02-24 MED FILL — ?METFORMIN HCL 500MG TABLET: 500 | 30 days supply | Qty: 120 | Fill #3

## 2016-03-11 MED FILL — LISINOPRIL-HCTZ 20-12.5 MG: 20-12.5 | 30 days supply | Qty: 30 | Fill #3

## 2016-03-11 MED FILL — ?ATORVASTATIN 20 MG TABLET: 20 | 30 days supply | Qty: 30 | Fill #3

## 2016-03-20 MED FILL — CLOPIDOGREL 75 MG TABLET: 75 | 30 days supply | Qty: 30 | Fill #2

## 2016-03-20 MED FILL — AMLODIPINE BESYLATE 10 MG T: 10 | 30 days supply | Qty: 30 | Fill #3

## 2016-03-20 MED FILL — CYCLOBENZAPRINE 5 MG TABLET: 5 | 30 days supply | Qty: 15 | Fill #2

## 2016-03-31 ENCOUNTER — Other Ambulatory Visit: Payer: Self-pay | Admitting: Family Medicine

## 2016-03-31 MED FILL — metFORMIN HCL 500 MG TABS: 500 | 30 days supply | Qty: 120 | Fill #0

## 2016-04-08 MED FILL — TRUE METRIX TEST STRIP: 25 days supply | Qty: 100 | Fill #1

## 2016-04-21 ENCOUNTER — Other Ambulatory Visit: Payer: Self-pay | Admitting: Family Medicine

## 2016-04-21 MED FILL — LISINOPRIL-HCTZ 20-12.5 MG: 20-12.5 | 30 days supply | Qty: 30 | Fill #0

## 2016-04-23 ENCOUNTER — Encounter: Payer: Self-pay | Admitting: Family Medicine

## 2016-04-23 ENCOUNTER — Ambulatory Visit: Payer: BLUE CROSS/BLUE SHIELD | Attending: Family Medicine | Admitting: Family Medicine

## 2016-04-23 VITALS — BP 130/81 | HR 90 | Temp 98.0°F | Resp 20 | Ht 71.5 in | Wt 202.8 lb

## 2016-04-23 DIAGNOSIS — Z79899 Other long term (current) drug therapy: Secondary | ICD-10-CM | POA: Insufficient documentation

## 2016-04-23 DIAGNOSIS — Z794 Long term (current) use of insulin: Secondary | ICD-10-CM | POA: Diagnosis not present

## 2016-04-23 DIAGNOSIS — I2510 Atherosclerotic heart disease of native coronary artery without angina pectoris: Secondary | ICD-10-CM

## 2016-04-23 DIAGNOSIS — E785 Hyperlipidemia, unspecified: Secondary | ICD-10-CM | POA: Diagnosis not present

## 2016-04-23 DIAGNOSIS — Z7984 Long term (current) use of oral hypoglycemic drugs: Secondary | ICD-10-CM | POA: Diagnosis not present

## 2016-04-23 DIAGNOSIS — G4700 Insomnia, unspecified: Secondary | ICD-10-CM

## 2016-04-23 DIAGNOSIS — E119 Type 2 diabetes mellitus without complications: Secondary | ICD-10-CM

## 2016-04-23 DIAGNOSIS — I10 Essential (primary) hypertension: Secondary | ICD-10-CM | POA: Diagnosis not present

## 2016-04-23 LAB — GLUCOSE, POCT (MANUAL RESULT ENTRY): POC GLUCOSE: 239 mg/dL — AB (ref 70–99)

## 2016-04-23 LAB — POCT GLYCOSYLATED HEMOGLOBIN (HGB A1C): Hemoglobin A1C: 9.8

## 2016-04-23 MED ORDER — INSULIN ASPART 100 UNIT/ML ~~LOC~~ SOLN: 0.0000 [IU] | Freq: Three times a day (TID) | SUBCUTANEOUS | Status: DC

## 2016-04-23 MED ORDER — ATORVASTATIN CALCIUM 20 MG PO TABS: 20.0000 mg | ORAL_TABLET | Freq: Every day | ORAL | Status: DC

## 2016-04-23 MED ORDER — LISINOPRIL-HYDROCHLOROTHIAZIDE 20-12.5 MG PO TABS: 1.0000 | ORAL_TABLET | Freq: Every day | ORAL | Status: DC

## 2016-04-23 MED ORDER — METFORMIN HCL 500 MG PO TABS: ORAL_TABLET | ORAL | Status: DC

## 2016-04-23 MED ORDER — TRAZODONE HCL 50 MG PO TABS: 50.0000 mg | ORAL_TABLET | Freq: Every evening | ORAL | Status: DC | PRN

## 2016-04-23 MED ORDER — INSULIN GLARGINE 100 UNIT/ML SOLOSTAR PEN: 50.0000 [IU] | PEN_INJECTOR | Freq: Every day | SUBCUTANEOUS | Status: DC

## 2016-04-23 MED ORDER — AMLODIPINE BESYLATE 10 MG PO TABS: 10.0000 mg | ORAL_TABLET | Freq: Every day | ORAL | Status: DC

## 2016-04-23 MED ORDER — CLOPIDOGREL BISULFATE 75 MG PO TABS: 75.0000 mg | ORAL_TABLET | Freq: Every day | ORAL | Status: DC

## 2016-04-23 MED ORDER — ASPIRIN EC 81 MG PO TBEC: 81.0000 mg | DELAYED_RELEASE_TABLET | Freq: Every day | ORAL | Status: DC

## 2016-04-23 NOTE — Progress Notes (Signed)
Subjective:  Patient ID: Jesse Wilcox, male    DOB: 08/25/1951  Age: 65 y.o. MRN: [id number]  CC: Diabetes and Medication Refill   HPI Jesse Wilcox Is a 65 year old male with a history of uncontrolled type 2 diabetes mellitus (A1c 12.6 from 10/2015), hypertension, hyperlipidemia, hepatitis C who comes in for a follow-up visit.  He has been compliant with his Lantus but not with his NovoLog and does not have his blood sugar log with him. He informs me his fasting sugars have been in the 130s. Denies hypoglycemic episodes, numbness in hands or feet. He has not had a recent eye exam and informs me "I think I need new glasses". He has been seen by podiatry and had his toenails clipped.  Has been compliant with his antihypertensives and his statin.  Cyclobenzaprine appears on his med list but he denies muscle spasms low back pain and has been taking this for insomnia.  Outpatient Prescriptions Prior to Visit  Medication Sig Dispense Refill  . Blood Glucose Monitoring Suppl (TRUE METRIX METER) w/Device KIT Use to check blood sugar as directed 1 kit 0  . glucose blood (TRUE METRIX BLOOD GLUCOSE TEST) test strip Use as instructed 100 each 12  . Insulin Pen Needle 31G X 8 MM MISC 1 each by Does not apply route at bedtime. 60 each 11  . Miconazole POWD Applied to area of irritation around the foreskin twice daily 1 Bottle 2  . Multiple Vitamin (MULTIVITAMIN WITH MINERALS) TABS Take 1 tablet by mouth daily.    . TRUEPLUS LANCETS 28G MISC Use as directed 100 each 12  . amLODipine (NORVASC) 10 MG tablet Take 1 tablet (10 mg total) by mouth daily. 30 tablet 3  . aspirin EC 81 MG tablet Take 1 tablet (81 mg total) by mouth daily. 30 tablet 3  . atorvastatin (LIPITOR) 20 MG tablet Take 1 tablet (20 mg total) by mouth daily. 90 tablet 3  . clopidogrel (PLAVIX) 75 MG tablet Take 1 tablet (75 mg total) by mouth daily. 30 tablet 3  . cyclobenzaprine (FLEXERIL) 5 MG tablet TAKE 1/2 TABLET BY  MOUTH DAILY AT BEDTIME 45 tablet 3  . Insulin Glargine (LANTUS) 100 UNIT/ML Solostar Pen Inject 50 Units into the skin daily. 60 pen 3  . lisinopril-hydrochlorothiazide (PRINZIDE,ZESTORETIC) 20-12.5 MG tablet TAKE 1 TABLET BY MOUTH DAILY. 30 tablet 0  . metFORMIN (GLUCOPHAGE) 500 MG tablet TAKE 2 TABLETS BY MOUTH 2 TIMES DAILY WITH A MEAL 120 tablet 0  . insulin aspart (NOVOLOG FLEXPEN) 100 UNIT/ML FlexPen Inject 8 Units into the skin 3 (three) times daily with meals. (Patient not taking: Reported on 04/23/2016) 15 mL 2  . insulin lispro (HUMALOG) 100 UNIT/ML KiwkPen Inject 0.08 mLs (8 Units total) into the skin 3 (three) times daily. (Patient not taking: Reported on 04/23/2016) 30 mL 2  . ketoconazole (NIZORAL) 2 % cream Apply topically daily. 75 g 3   No facility-administered medications prior to visit.    ROS Review of Systems  Constitutional: Negative for activity change and appetite change.  HENT: Negative for sinus pressure and sore throat.   Eyes: Negative for visual disturbance.  Respiratory: Negative for cough, chest tightness and shortness of breath.   Cardiovascular: Negative for chest pain and leg swelling.  Gastrointestinal: Negative for abdominal pain, diarrhea, constipation and abdominal distention.  Endocrine: Negative.   Genitourinary: Negative for dysuria.  Musculoskeletal: Negative for myalgias and joint swelling.  Skin: Negative for rash.  Allergic/Immunologic:  Negative.   Neurological: Negative for weakness, light-headedness and numbness.  Psychiatric/Behavioral: Negative for suicidal ideas and dysphoric mood.    Objective:  BP 130/81 mmHg  Pulse 90  Temp(Src) 98 F (36.7 C) (Oral)  Resp 20  Ht 5' 11.5" (1.816 m)  Wt 202 lb 12.8 oz (91.989 kg)  BMI 27.89 kg/m2  SpO2 94%  BP/Weight 04/23/2016 01/01/2016 11/18/2015  Systolic BP 130 148 104  Diastolic BP 81 87 95  Wt. (Lbs) 202.8 196.2 193  BMI 27.89 27.38 26.93      Physical Exam  Constitutional: He is  oriented to person, place, and time. He appears well-developed and well-nourished.  Cardiovascular: Normal rate, normal heart sounds and intact distal pulses.   No murmur heard. Pulmonary/Chest: Effort normal and breath sounds normal. He has no wheezes. He has no rales. He exhibits no tenderness.  Abdominal: Soft. Bowel sounds are normal. He exhibits no distension and no mass. There is no tenderness.  Musculoskeletal: Normal range of motion.  Neurological: He is alert and oriented to person, place, and time.  Skin: Skin is warm and dry.  Psychiatric: He has a normal mood and affect.     Lipid Panel     Component Value Date/Time   CHOL 233* 08/08/2014 1024   TRIG 234.0* 08/08/2014 1024   HDL 29.10* 08/08/2014 1024   CHOLHDL 8 08/08/2014 1024   VLDL 46.8* 08/08/2014 1024   LDLDIRECT 140.6 08/08/2014 1024     CMP Latest Ref Rng 04/22/2015 08/08/2014 03/16/2014  Glucose 70 - 99 mg/dL 193(H) 120(H) 145(H)  BUN 6 - 23 mg/dL 11 11 12  Creatinine 0.50 - 1.35 mg/dL 0.75 0.8 0.85  Sodium 135 - 145 mEq/L 139 136 135  Potassium 3.5 - 5.3 mEq/L 4.1 4.0 4.2  Chloride 96 - 112 mEq/L 107 102 98  CO2 19 - 32 mEq/L 25 25 26  Calcium 8.4 - 10.5 mg/dL 9.6 9.7 10.1  Total Protein 6.0 - 8.3 g/dL 7.6 8.5(H) 7.9  Total Bilirubin 0.2 - 1.2 mg/dL 0.8 0.7 1.0  Alkaline Phos 39 - 117 U/L 100 85 100  AST 0 - 37 U/L 24 28 30  ALT 0 - 53 U/L 56(H) 37 41     Lab Results  Component Value Date   HGBA1C 9.8 04/23/2016    Assessment & Plan:   1. Type 2 diabetes mellitus without complication, unspecified long term insulin use status (HCC) Uncontrolled with A1c of 9.8 which has improved from 12.6 previously Poor control also due to the fact that he stopped NovoLog completely Switched from fixed dose of NovoLog to NovoLog sliding scale Will review blood sugar log at next office visit Emphasize compliance with medication, diabetic diet, lifestyle modification. - HgB A1c - Glucose (CBG) - aspirin EC 81  MG tablet; Take 1 tablet (81 mg total) by mouth daily.  Dispense: 30 tablet; Refill: 3 - Insulin Glargine (LANTUS) 100 UNIT/ML Solostar Pen; Inject 50 Units into the skin daily.  Dispense: 60 pen; Refill: 3 - metFORMIN (GLUCOPHAGE) 500 MG tablet; TAKE 2 TABLETS BY MOUTH 2 TIMES DAILY WITH A MEAL  Dispense: 120 tablet; Refill: 3 - COMPLETE METABOLIC PANEL WITH GFR; Future - Microalbumin / creatinine urine ratio; Future - Ambulatory referral to Ophthalmology  2. Essential hypertension Controlled Low-sodium diet - amLODipine (NORVASC) 10 MG tablet; Take 1 tablet (10 mg total) by mouth daily.  Dispense: 30 tablet; Refill: 3 - lisinopril-hydrochlorothiazide (PRINZIDE,ZESTORETIC) 20-12.5 MG tablet; Take 1 tablet by mouth daily.  Dispense: 30   tablet; Refill: 3  3. Hyperlipidemia Uncontrolled Repeat lipid panel Low-cholesterol diet - atorvastatin (LIPITOR) 20 MG tablet; Take 1 tablet (20 mg total) by mouth daily.  Dispense: 30 tablet; Refill: 3 - Lipid panel; Future  4. ASCVD (arteriosclerotic cardiovascular disease) - clopidogrel (PLAVIX) 75 MG tablet; Take 1 tablet (75 mg total) by mouth daily.  Dispense: 30 tablet; Refill: 3  5. Insomnia He was previously taking Flexeril for insomnia which I have discontinued and placed him on trazodone. - traZODone (DESYREL) 50 MG tablet; Take 1 tablet (50 mg total) by mouth at bedtime as needed for sleep.  Dispense: 30 tablet; Refill: 3   Meds ordered this encounter  Medications  . amLODipine (NORVASC) 10 MG tablet    Sig: Take 1 tablet (10 mg total) by mouth daily.    Dispense:  30 tablet    Refill:  3  . aspirin EC 81 MG tablet    Sig: Take 1 tablet (81 mg total) by mouth daily.    Dispense:  30 tablet    Refill:  3  . atorvastatin (LIPITOR) 20 MG tablet    Sig: Take 1 tablet (20 mg total) by mouth daily.    Dispense:  30 tablet    Refill:  3  . clopidogrel (PLAVIX) 75 MG tablet    Sig: Take 1 tablet (75 mg total) by mouth daily.     Dispense:  30 tablet    Refill:  3  . Insulin Glargine (LANTUS) 100 UNIT/ML Solostar Pen    Sig: Inject 50 Units into the skin daily.    Dispense:  60 pen    Refill:  3  . metFORMIN (GLUCOPHAGE) 500 MG tablet    Sig: TAKE 2 TABLETS BY MOUTH 2 TIMES DAILY WITH A MEAL    Dispense:  120 tablet    Refill:  3  . lisinopril-hydrochlorothiazide (PRINZIDE,ZESTORETIC) 20-12.5 MG tablet    Sig: Take 1 tablet by mouth daily.    Dispense:  30 tablet    Refill:  3  . traZODone (DESYREL) 50 MG tablet    Sig: Take 1 tablet (50 mg total) by mouth at bedtime as needed for sleep.    Dispense:  30 tablet    Refill:  3    Follow-up: Return in about 1 month (around 05/23/2016) for Follow-up on diabetes mellitus.   Arnoldo Morale MD

## 2016-04-23 NOTE — Patient Instructions (Signed)
Diabetes Mellitus and Food It is important for you to manage your blood sugar (glucose) level. Your blood glucose level can be greatly affected by what you eat. Eating healthier foods in the appropriate amounts throughout the day at about the same time each day will help you control your blood glucose level. It can also help slow or prevent worsening of your diabetes mellitus. Healthy eating may even help you improve the level of your blood pressure and reach or maintain a healthy weight.  General recommendations for healthful eating and cooking habits include:  Eating meals and snacks regularly. Avoid going long periods of time without eating to lose weight.  Eating a diet that consists mainly of plant-based foods, such as fruits, vegetables, nuts, legumes, and whole grains.  Using low-heat cooking methods, such as baking, instead of high-heat cooking methods, such as deep frying. Work with your dietitian to make sure you understand how to use the Nutrition Facts information on food labels. HOW CAN FOOD AFFECT ME? Carbohydrates Carbohydrates affect your blood glucose level more than any other type of food. Your dietitian will help you determine how many carbohydrates to eat at each meal and teach you how to count carbohydrates. Counting carbohydrates is important to keep your blood glucose at a healthy level, especially if you are using insulin or taking certain medicines for diabetes mellitus. Alcohol Alcohol can cause sudden decreases in blood glucose (hypoglycemia), especially if you use insulin or take certain medicines for diabetes mellitus. Hypoglycemia can be a life-threatening condition. Symptoms of hypoglycemia (sleepiness, dizziness, and disorientation) are similar to symptoms of having too much alcohol.  If your health care provider has given you approval to drink alcohol, do so in moderation and use the following guidelines:  Women should not have more than one drink per day, and men  should not have more than two drinks per day. One drink is equal to:  12 oz of beer.  5 oz of wine.  1 oz of hard liquor.  Do not drink on an empty stomach.  Keep yourself hydrated. Have water, diet soda, or unsweetened iced tea.  Regular soda, juice, and other mixers might contain a lot of carbohydrates and should be counted. WHAT FOODS ARE NOT RECOMMENDED? As you make food choices, it is important to remember that all foods are not the same. Some foods have fewer nutrients per serving than other foods, even though they might have the same number of calories or carbohydrates. It is difficult to get your body what it needs when you eat foods with fewer nutrients. Examples of foods that you should avoid that are high in calories and carbohydrates but low in nutrients include:  Trans fats (most processed foods list trans fats on the Nutrition Facts label).  Regular soda.  Juice.  Candy.  Sweets, such as cake, pie, doughnuts, and cookies.  Fried foods. WHAT FOODS CAN I EAT? Eat nutrient-rich foods, which will nourish your body and keep you healthy. The food you should eat also will depend on several factors, including:  The calories you need.  The medicines you take.  Your weight.  Your blood glucose level.  Your blood pressure level.  Your cholesterol level. You should eat a variety of foods, including:  Protein.  Lean cuts of meat.  Proteins low in saturated fats, such as fish, egg whites, and beans. Avoid processed meats.  Fruits and vegetables.  Fruits and vegetables that may help control blood glucose levels, such as apples, mangoes, and   yams.  Dairy products.  Choose fat-free or low-fat dairy products, such as milk, yogurt, and cheese.  Grains, bread, pasta, and rice.  Choose whole grain products, such as multigrain bread, whole oats, and brown rice. These foods may help control blood pressure.  Fats.  Foods containing healthful fats, such as nuts,  avocado, olive oil, canola oil, and fish. DOES EVERYONE WITH DIABETES MELLITUS HAVE THE SAME MEAL PLAN? Because every person with diabetes mellitus is different, there is not one meal plan that works for everyone. It is very important that you meet with a dietitian who will help you create a meal plan that is just right for you.   This information is not intended to replace advice given to you by your health care provider. Make sure you discuss any questions you have with your health care provider.   Document Released: 07/16/2005 Document Revised: 11/09/2014 Document Reviewed: 09/15/2013 [employer] Interactive Patient Education 2016 [employer]  

## 2016-04-23 NOTE — Progress Notes (Signed)
Medication refill Stopped novolog or humalog on own

## 2016-04-24 ENCOUNTER — Ambulatory Visit: Payer: BLUE CROSS/BLUE SHIELD | Attending: Family Medicine

## 2016-04-24 DIAGNOSIS — E119 Type 2 diabetes mellitus without complications: Secondary | ICD-10-CM | POA: Insufficient documentation

## 2016-04-24 DIAGNOSIS — E785 Hyperlipidemia, unspecified: Secondary | ICD-10-CM

## 2016-04-24 LAB — LIPID PANEL
CHOL/HDL RATIO: 4.1 ratio (ref <=5.0)
Cholesterol: 139 mg/dL (ref 125–200)
HDL: 34 mg/dL — ABNORMAL LOW (ref >=40)
LDL Cholesterol: 67 mg/dL (ref <130)
Triglycerides: 191 mg/dL — ABNORMAL HIGH (ref <150)
VLDL: 38 mg/dL — AB (ref <30)

## 2016-04-24 LAB — COMPLETE METABOLIC PANEL WITH GFR
ALBUMIN: 4.2 g/dL (ref 3.6–5.1)
ALT: 47 U/L — AB (ref 9–46)
AST: 21 U/L (ref 10–35)
Alkaline Phosphatase: 92 U/L (ref 40–115)
BUN: 11 mg/dL (ref 7–25)
CHLORIDE: 98 mmol/L (ref 98–110)
CO2: 26 mmol/L (ref 20–31)
CREATININE: 0.8 mg/dL (ref 0.70–1.25)
Calcium: 9.5 mg/dL (ref 8.6–10.3)
GFR, Est African American: >89 mL/min (ref >=60)
GFR, Est Non African American: >89 mL/min (ref >=60)
GLUCOSE: 134 mg/dL — AB (ref 65–99)
POTASSIUM: 4.4 mmol/L (ref 3.5–5.3)
SODIUM: 138 mmol/L (ref 135–146)
Total Bilirubin: 1 mg/dL (ref 0.2–1.2)
Total Protein: 7.6 g/dL (ref 6.1–8.1)

## 2016-04-24 MED FILL — ATORVASTATIN 20 MG TABLET: 20 | 30 days supply | Qty: 30 | Fill #4

## 2016-04-24 NOTE — Progress Notes (Signed)
Pt is here for lab work only. 

## 2016-04-25 LAB — MICROALBUMIN / CREATININE URINE RATIO
CREATININE, URINE: 74 mg/dL (ref 20–370)
MICROALB UR: 0.2 mg/dL
Microalb Creat Ratio: 3 mcg/mg creat (ref <30)

## 2016-04-27 MED FILL — CLOPIDOGREL 75 MG TABLET: 75 | 30 days supply | Qty: 30 | Fill #0

## 2016-04-27 MED FILL — AMLODIPINE BESYLATE 10 MG T: 10 | 30 days supply | Qty: 30 | Fill #0

## 2016-04-28 ENCOUNTER — Telehealth: Payer: Self-pay

## 2016-04-28 NOTE — Telephone Encounter (Signed)
Writer attempted to call patient with lab results.  Writer LVM for patient with clinic phone number requesting a call back to discuss.

## 2016-04-28 NOTE — Telephone Encounter (Signed)
Pt. Returned call. Please f/u with pt. °

## 2016-04-28 NOTE — Telephone Encounter (Signed)
-----   Message from Arnoldo Morale, MD sent at 04/27/2016  9:00 AM EDT ----- Cholesterol has improved significantly compared to last set of labs. Continue lifestyle modifications and fish oil caps will also help.

## 2016-04-29 ENCOUNTER — Encounter: Payer: Self-pay | Admitting: *Deleted

## 2016-04-29 ENCOUNTER — Other Ambulatory Visit: Payer: Self-pay

## 2016-04-29 ENCOUNTER — Other Ambulatory Visit: Payer: Self-pay | Admitting: *Deleted

## 2016-04-29 DIAGNOSIS — I10 Essential (primary) hypertension: Secondary | ICD-10-CM

## 2016-04-29 DIAGNOSIS — E119 Type 2 diabetes mellitus without complications: Secondary | ICD-10-CM

## 2016-04-29 MED ORDER — INSULIN ASPART 100 UNIT/ML FLEXPEN: 0.0000 [IU] | PEN_INJECTOR | Freq: Three times a day (TID) | SUBCUTANEOUS | Status: DC

## 2016-04-30 ENCOUNTER — Telehealth: Payer: Self-pay | Admitting: Family Medicine

## 2016-04-30 NOTE — Telephone Encounter (Signed)
Patient would like lab results. °Please follow up. ° °

## 2016-04-30 NOTE — Telephone Encounter (Signed)
Patient called and requested his results. Please f/u with pt.

## 2016-04-30 NOTE — Telephone Encounter (Signed)
Writer spoke with patient regarding lab results and improvement with his cholesterol level.  Patient stated understanding and was very happy.

## 2016-05-08 MED FILL — LANTUS SOLOSTAR 100 UNITS/M: 100 | 30 days supply | Qty: 15 | Fill #0

## 2016-05-08 MED FILL — metFORMIN HCL 500 MG TABS: 500 | 30 days supply | Qty: 120 | Fill #0

## 2016-05-27 ENCOUNTER — Ambulatory Visit: Payer: BLUE CROSS/BLUE SHIELD | Admitting: Family Medicine

## 2016-06-03 MED FILL — ATORVASTATIN 20 MG TABLET: 20 | 30 days supply | Qty: 30 | Fill #5

## 2016-06-03 MED FILL — LISINOPRIL-HCTZ 20-12.5 MG: 20-12.5 | 30 days supply | Qty: 30 | Fill #0

## 2016-06-09 MED FILL — CLOPIDOGREL 75 MG TABLET: 75 | 30 days supply | Qty: 30 | Fill #1

## 2016-06-09 MED FILL — AMLODIPINE BESYLATE 10 MG T: 10 | 30 days supply | Qty: 30 | Fill #1

## 2016-06-10 ENCOUNTER — Ambulatory Visit: Payer: BLUE CROSS/BLUE SHIELD | Admitting: Family Medicine

## 2016-06-23 MED FILL — LANTUS SOLOSTAR 100 UNITS/M: 100 | 30 days supply | Qty: 15 | Fill #1

## 2016-06-23 MED FILL — metFORMIN HCL 500 MG TABS: 500 | 30 days supply | Qty: 120 | Fill #1

## 2016-06-29 ENCOUNTER — Ambulatory Visit: Payer: BLUE CROSS/BLUE SHIELD | Admitting: Family Medicine

## 2016-07-07 MED FILL — LISINOPRIL-HCTZ 20-12.5 MG: 20-12.5 | 30 days supply | Qty: 30 | Fill #1

## 2016-07-07 MED FILL — ATORVASTATIN 20 MG TABLET: 20 | 30 days supply | Qty: 30 | Fill #6

## 2016-07-15 MED FILL — CLOPIDOGREL 75 MG TABLET: 75 | 30 days supply | Qty: 30 | Fill #2

## 2016-07-27 MED FILL — AMLODIPINE BESYLATE 10 MG T: 10 | 30 days supply | Qty: 30 | Fill #2

## 2016-08-03 MED FILL — metFORMIN HCL 500 MG TABS: 500 | 30 days supply | Qty: 120 | Fill #2

## 2016-08-17 ENCOUNTER — Ambulatory Visit: Payer: BLUE CROSS/BLUE SHIELD | Attending: Family Medicine | Admitting: Family Medicine

## 2016-08-17 ENCOUNTER — Encounter: Payer: Self-pay | Admitting: Family Medicine

## 2016-08-17 VITALS — BP 137/71 | HR 85 | Temp 98.0°F | Ht 71.0 in | Wt 204.8 lb

## 2016-08-17 DIAGNOSIS — Z7902 Long term (current) use of antithrombotics/antiplatelets: Secondary | ICD-10-CM | POA: Diagnosis not present

## 2016-08-17 DIAGNOSIS — E1165 Type 2 diabetes mellitus with hyperglycemia: Secondary | ICD-10-CM | POA: Insufficient documentation

## 2016-08-17 DIAGNOSIS — Z9889 Other specified postprocedural states: Secondary | ICD-10-CM | POA: Diagnosis not present

## 2016-08-17 DIAGNOSIS — I2510 Atherosclerotic heart disease of native coronary artery without angina pectoris: Secondary | ICD-10-CM | POA: Insufficient documentation

## 2016-08-17 DIAGNOSIS — I10 Essential (primary) hypertension: Secondary | ICD-10-CM | POA: Insufficient documentation

## 2016-08-17 DIAGNOSIS — E119 Type 2 diabetes mellitus without complications: Secondary | ICD-10-CM

## 2016-08-17 DIAGNOSIS — G4709 Other insomnia: Secondary | ICD-10-CM

## 2016-08-17 DIAGNOSIS — E7800 Pure hypercholesterolemia, unspecified: Secondary | ICD-10-CM | POA: Diagnosis not present

## 2016-08-17 DIAGNOSIS — B182 Chronic viral hepatitis C: Secondary | ICD-10-CM | POA: Insufficient documentation

## 2016-08-17 DIAGNOSIS — Z794 Long term (current) use of insulin: Secondary | ICD-10-CM | POA: Insufficient documentation

## 2016-08-17 DIAGNOSIS — Z7982 Long term (current) use of aspirin: Secondary | ICD-10-CM | POA: Diagnosis not present

## 2016-08-17 DIAGNOSIS — G4700 Insomnia, unspecified: Secondary | ICD-10-CM | POA: Insufficient documentation

## 2016-08-17 DIAGNOSIS — Z79899 Other long term (current) drug therapy: Secondary | ICD-10-CM | POA: Diagnosis not present

## 2016-08-17 DIAGNOSIS — Z1211 Encounter for screening for malignant neoplasm of colon: Secondary | ICD-10-CM

## 2016-08-17 LAB — GLUCOSE, POCT (MANUAL RESULT ENTRY): POC GLUCOSE: 233 mg/dL — AB (ref 70–99)

## 2016-08-17 LAB — POCT GLYCOSYLATED HEMOGLOBIN (HGB A1C): HEMOGLOBIN A1C: 8.4

## 2016-08-17 MED ORDER — CLOPIDOGREL BISULFATE 75 MG PO TABS: 75.0000 mg | ORAL_TABLET | Freq: Every day | ORAL | 3 refills | Status: DC

## 2016-08-17 MED ORDER — ASPIRIN EC 81 MG PO TBEC: 81.0000 mg | DELAYED_RELEASE_TABLET | Freq: Every day | ORAL | 3 refills | Status: DC

## 2016-08-17 MED ORDER — INSULIN GLARGINE 100 UNIT/ML SOLOSTAR PEN: 50.0000 [IU] | PEN_INJECTOR | Freq: Every day | SUBCUTANEOUS | 3 refills | Status: DC

## 2016-08-17 MED ORDER — LISINOPRIL-HYDROCHLOROTHIAZIDE 20-12.5 MG PO TABS: 1.0000 | ORAL_TABLET | Freq: Every day | ORAL | 3 refills | Status: DC

## 2016-08-17 MED ORDER — AMLODIPINE BESYLATE 10 MG PO TABS: 10.0000 mg | ORAL_TABLET | Freq: Every day | ORAL | 3 refills | Status: DC

## 2016-08-17 MED ORDER — INSULIN PEN NEEDLE 31G X 8 MM MISC: 1.0000 | Freq: Three times a day (TID) | 11 refills | Status: DC

## 2016-08-17 MED ORDER — METFORMIN HCL 500 MG PO TABS: ORAL_TABLET | ORAL | 3 refills | Status: DC

## 2016-08-17 MED ORDER — TRUEPLUS LANCETS 28G MISC: 12 refills | Status: DC

## 2016-08-17 MED ORDER — TRAZODONE HCL 50 MG PO TABS: 50.0000 mg | ORAL_TABLET | Freq: Every evening | ORAL | 3 refills | Status: DC | PRN

## 2016-08-17 MED ORDER — ATORVASTATIN CALCIUM 20 MG PO TABS: 20.0000 mg | ORAL_TABLET | Freq: Every day | ORAL | 3 refills | Status: DC

## 2016-08-17 MED ORDER — INSULIN ASPART 100 UNIT/ML FLEXPEN: 0.0000 [IU] | PEN_INJECTOR | Freq: Three times a day (TID) | SUBCUTANEOUS | 3 refills | Status: DC

## 2016-08-17 MED FILL — ATORVASTATIN 20 MG TABLET: 20 | 30 days supply | Qty: 30 | Fill #7

## 2016-08-17 MED FILL — LISINOPRIL-HCTZ 20-12.5 MG: 20-12.5 | 30 days supply | Qty: 30 | Fill #2

## 2016-08-17 MED FILL — LANTUS SOLOSTAR 100 UNITS/M: 100 | 30 days supply | Qty: 15 | Fill #2

## 2016-08-17 NOTE — Patient Instructions (Signed)
Diabetes Mellitus and Food It is important for you to manage your blood sugar (glucose) level. Your blood glucose level can be greatly affected by what you eat. Eating healthier foods in the appropriate amounts throughout the day at about the same time each day will help you control your blood glucose level. It can also help slow or prevent worsening of your diabetes mellitus. Healthy eating may even help you improve the level of your blood pressure and reach or maintain a healthy weight.  General recommendations for healthful eating and cooking habits include:  Eating meals and snacks regularly. Avoid going long periods of time without eating to lose weight.  Eating a diet that consists mainly of plant-based foods, such as fruits, vegetables, nuts, legumes, and whole grains.  Using low-heat cooking methods, such as baking, instead of high-heat cooking methods, such as deep frying. Work with your dietitian to make sure you understand how to use the Nutrition Facts information on food labels. HOW CAN FOOD AFFECT ME? Carbohydrates Carbohydrates affect your blood glucose level more than any other type of food. Your dietitian will help you determine how many carbohydrates to eat at each meal and teach you how to count carbohydrates. Counting carbohydrates is important to keep your blood glucose at a healthy level, especially if you are using insulin or taking certain medicines for diabetes mellitus. Alcohol Alcohol can cause sudden decreases in blood glucose (hypoglycemia), especially if you use insulin or take certain medicines for diabetes mellitus. Hypoglycemia can be a life-threatening condition. Symptoms of hypoglycemia (sleepiness, dizziness, and disorientation) are similar to symptoms of having too much alcohol.  If your health care provider has given you approval to drink alcohol, do so in moderation and use the following guidelines:  Women should not have more than one drink per day, and men  should not have more than two drinks per day. One drink is equal to:  12 oz of beer.  5 oz of wine.  1 oz of hard liquor.  Do not drink on an empty stomach.  Keep yourself hydrated. Have water, diet soda, or unsweetened iced tea.  Regular soda, juice, and other mixers might contain a lot of carbohydrates and should be counted. WHAT FOODS ARE NOT RECOMMENDED? As you make food choices, it is important to remember that all foods are not the same. Some foods have fewer nutrients per serving than other foods, even though they might have the same number of calories or carbohydrates. It is difficult to get your body what it needs when you eat foods with fewer nutrients. Examples of foods that you should avoid that are high in calories and carbohydrates but low in nutrients include:  Trans fats (most processed foods list trans fats on the Nutrition Facts label).  Regular soda.  Juice.  Candy.  Sweets, such as cake, pie, doughnuts, and cookies.  Fried foods. WHAT FOODS CAN I EAT? Eat nutrient-rich foods, which will nourish your body and keep you healthy. The food you should eat also will depend on several factors, including:  The calories you need.  The medicines you take.  Your weight.  Your blood glucose level.  Your blood pressure level.  Your cholesterol level. You should eat a variety of foods, including:  Protein.  Lean cuts of meat.  Proteins low in saturated fats, such as fish, egg whites, and beans. Avoid processed meats.  Fruits and vegetables.  Fruits and vegetables that may help control blood glucose levels, such as apples, mangoes, and   yams.  Dairy products.  Choose fat-free or low-fat dairy products, such as milk, yogurt, and cheese.  Grains, bread, pasta, and rice.  Choose whole grain products, such as multigrain bread, whole oats, and brown rice. These foods may help control blood pressure.  Fats.  Foods containing healthful fats, such as nuts,  avocado, olive oil, canola oil, and fish. DOES EVERYONE WITH DIABETES MELLITUS HAVE THE SAME MEAL PLAN? Because every person with diabetes mellitus is different, there is not one meal plan that works for everyone. It is very important that you meet with a dietitian who will help you create a meal plan that is just right for you.   This information is not intended to replace advice given to you by your health care provider. Make sure you discuss any questions you have with your health care provider.   Document Released: 07/16/2005 Document Revised: 11/09/2014 Document Reviewed: 09/15/2013 [employer] Interactive Patient Education 2016 [employer]  

## 2016-08-17 NOTE — Progress Notes (Signed)
Subjective:  Patient ID: Jesse Wilcox, male    DOB: 17-Dec-1950  Age: 65 y.o. MRN: [id number]  CC: Diabetes   HPI ESTEBAN Wilcox Is a 65 year old male with a history of uncontrolled type 2 diabetes mellitus (A1c 8.4 from today), hypertension, hyperlipidemia, hepatitis C who comes in for a follow-up visit.  He has been compliant with his Lantus but not with his NovoLog "Because his blood sugar gets too low "even though I had switched him from a fixed regimen of NovoLog to NovoLog sliding scale and had encouraged compliance. I have reviewed his blood sugars from his glucometer and his 7 day average is 104, 14 day average is 99 and 30 day average is 120. His CBG in the clinic is 233 however he states he just had a bowl of cereal. He had a recent eye exam with an ophthalmologist and is unsure of the name.  Has been compliant with all his other medications and does not have any concerns at this time Continues to smoke and is not willing to quit smoking.  Past Medical History:  Diagnosis Date  . Carotid artery occlusion   . Chronic hepatitis C ([place])   . Diabetes mellitus     Past Surgical History:  Procedure Laterality Date  . Aortic arch angiogram  04-15-12   By Dr. Einar Gip  . ARCH AORTOGRAM  04/15/2012   Procedure: ARCH AORTOGRAM;  Surgeon: Laverda Page, MD;  Location: [facility] C[facility] LAB;  Service: Cardiovascular;;  . CAROTID ANGIOGRAM N/A 04/15/2012   Procedure: CAROTID ANGIOGRAM;  Surgeon: Laverda Page, MD;  Location: [facility] CATH LAB;  Service: Cardiovascular;  Laterality: N/A;  . CAROTID STENT INSERTION N/A 08/23/2012   Procedure: CAROTID STENT INSERTION;  Surgeon: Elam Dutch, MD;  Location: [facility] CATH LAB;  Service: Cardiovascular;  Laterality: N/A;    No Known Allergies   Outpatient Medications Prior to Visit  Medication Sig Dispense Refill  . Blood Glucose Monitoring Suppl (TRUE METRIX METER) w/Device KIT Use to check blood sugar as directed 1 kit 0  . glucose blood  (TRUE METRIX BLOOD GLUCOSE TEST) test strip Use as instructed 100 each 12  . Multiple Vitamin (MULTIVITAMIN WITH MINERALS) TABS Take 1 tablet by mouth daily.    Marland Kitchen amLODipine (NORVASC) 10 MG tablet Take 1 tablet (10 mg total) by mouth daily. 30 tablet 3  . aspirin EC 81 MG tablet Take 1 tablet (81 mg total) by mouth daily. 30 tablet 3  . atorvastatin (LIPITOR) 20 MG tablet Take 1 tablet (20 mg total) by mouth daily. 30 tablet 3  . clopidogrel (PLAVIX) 75 MG tablet Take 1 tablet (75 mg total) by mouth daily. 30 tablet 3  . Insulin Glargine (LANTUS) 100 UNIT/ML Solostar Pen Inject 50 Units into the skin daily. 60 pen 3  . Insulin Pen Needle 31G X 8 MM MISC 1 each by Does not apply route at bedtime. 60 each 11  . lisinopril-hydrochlorothiazide (PRINZIDE,ZESTORETIC) 20-12.5 MG tablet Take 1 tablet by mouth daily. 30 tablet 3  . metFORMIN (GLUCOPHAGE) 500 MG tablet TAKE 2 TABLETS BY MOUTH 2 TIMES DAILY WITH A MEAL 120 tablet 3  . TRUEPLUS LANCETS 28G MISC Use as directed 100 each 12  . Miconazole POWD Applied to area of irritation around the foreskin twice daily (Patient not taking: Reported on 08/17/2016) 1 Bottle 2  . insulin aspart (NOVOLOG FLEXPEN) 100 UNIT/ML FlexPen Inject 0-12 Units into the skin 3 (three) times daily with meals. As  per sliding scale (Patient not taking: Reported on 08/17/2016) 15 mL 2  . traZODone (DESYREL) 50 MG tablet Take 1 tablet (50 mg total) by mouth at bedtime as needed for sleep. (Patient not taking: Reported on 08/17/2016) 30 tablet 3   No facility-administered medications prior to visit.     ROS Review of Systems  Constitutional: Negative for activity change and appetite change.  HENT: Negative for sinus pressure and sore throat.   Eyes: Negative for visual disturbance.  Respiratory: Negative for cough, chest tightness and shortness of breath.   Cardiovascular: Negative for chest pain and leg swelling.  Gastrointestinal: Negative for abdominal distention,  abdominal pain, constipation and diarrhea.  Endocrine: Negative.   Genitourinary: Negative for dysuria.  Musculoskeletal: Negative for joint swelling and myalgias.  Skin: Negative for rash.  Allergic/Immunologic: Negative.   Neurological: Negative for weakness, light-headedness and numbness.  Psychiatric/Behavioral: Negative for dysphoric mood and suicidal ideas.    Objective:  BP 137/71 (BP Location: Right Arm, Patient Position: Sitting, Cuff Size: Large)   Pulse 85   Temp 98 F (36.7 C) (Oral)   Ht '5\' 11"'$  (1.803 m)   Wt 204 lb 12.8 oz (92.9 kg)   SpO2 98%   BMI 28.56 kg/m   BP/Weight 08/17/2016 6/96/7893 06/02/174  Systolic BP 102 585 277  Diastolic BP 71 81 87  Wt. (Lbs) 204.8 202.8 196.2  BMI 28.56 27.89 27.38      Physical Exam Constitutional: He is oriented to person, place, and time. He appears well-developed and well-nourished.  Cardiovascular: Normal rate, normal heart sounds and intact distal pulses.   No murmur heard. Pulmonary/Chest: Effort normal and breath sounds normal. He has no wheezes. He has no rales. He exhibits no tenderness.  Abdominal: Soft. Bowel sounds are normal. He exhibits no distension and no mass. There is no tenderness.  Musculoskeletal: Normal range of motion.  Neurological: He is alert and oriented to person, place, and time.  Skin: Skin is warm and dry.  Psychiatric: He has a normal mood and affect.    Lab Results  Component Value Date   HGBA1C 8.4 08/17/2016    CMP Latest Ref Rng & Units 04/24/2016 04/22/2015 08/08/2014  Glucose 65 - 99 mg/dL 134(H) 193(H) 120(H)  BUN 7 - 25 mg/dL '11 11 11  '$ Creatinine 0.70 - 1.25 mg/dL 0.80 0.75 0.8  Sodium 135 - 146 mmol/L 138 139 136  Potassium 3.5 - 5.3 mmol/L 4.4 4.1 4.0  Chloride 98 - 110 mmol/L 98 107 102  CO2 20 - 31 mmol/L '26 25 25  '$ Calcium 8.6 - 10.3 mg/dL 9.5 9.6 9.7  Total Protein 6.1 - 8.1 g/dL 7.6 7.6 8.5(H)  Total Bilirubin 0.2 - 1.2 mg/dL 1.0 0.8 0.7  Alkaline Phos 40 - 115 U/L 92  100 85  AST 10 - 35 U/L '21 24 28  '$ ALT 9 - 46 U/L 47(H) 56(H) 37    Lipid Panel     Component Value Date/Time   CHOL 139 04/24/2016 1033   TRIG 191 (H) 04/24/2016 1033   HDL 34 (L) 04/24/2016 1033   CHOLHDL 4.1 04/24/2016 1033   VLDL 38 (H) 04/24/2016 1033   LDLCALC 67 04/24/2016 1033   LDLDIRECT 140.6 08/08/2014 1024    Assessment & Plan:   1. Type 2 diabetes mellitus without complication, without long-term current use of insulin (HCC) Uncontrolled with A1c of 8.4 which has trended down from 9.8 previously Emphasized compliance with Novolog which he has not been taking - Glucose (  CBG) - HgB A1c  2. Other insomnia Stable - traZODone (DESYREL) 50 MG tablet; Take 1 tablet (50 mg total) by mouth at bedtime as needed for sleep.  Dispense: 30 tablet; Refill: 3  3. Type 2 diabetes mellitus without complication, unspecified long term insulin use status (HCC) - metFORMIN (GLUCOPHAGE) 500 MG tablet; TAKE 2 TABLETS BY MOUTH 2 TIMES DAILY WITH A MEAL  Dispense: 120 tablet; Refill: 3 - Insulin Glargine (LANTUS) 100 UNIT/ML Solostar Pen; Inject 50 Units into the skin daily.  Dispense: 60 pen; Refill: 3 - aspirin EC 81 MG tablet; Take 1 tablet (81 mg total) by mouth daily.  Dispense: 30 tablet; Refill: 3 - Insulin Pen Needle 31G X 8 MM MISC; 1 each by Does not apply route 4 (four) times daily - after meals and at bedtime.  Dispense: 120 each; Refill: 11  4. Essential hypertension Controlled - lisinopril-hydrochlorothiazide (PRINZIDE,ZESTORETIC) 20-12.5 MG tablet; Take 1 tablet by mouth daily.  Dispense: 30 tablet; Refill: 3 - amLODipine (NORVASC) 10 MG tablet; Take 1 tablet (10 mg total) by mouth daily.  Dispense: 30 tablet; Refill: 3  5. ASCVD (arteriosclerotic cardiovascular disease) Stable Risk factor reduction - clopidogrel (PLAVIX) 75 MG tablet; Take 1 tablet (75 mg total) by mouth daily.  Dispense: 30 tablet; Refill: 3  6. Pure hypercholesterolemia - atorvastatin (LIPITOR) 20 MG  tablet; Take 1 tablet (20 mg total) by mouth daily.  Dispense: 30 tablet; Refill: 3  7. Special screening for malignant neoplasms, colon - Ambulatory referral to Gastroenterology   Meds ordered this encounter  Medications  . TRUEPLUS LANCETS 28G MISC    Sig: Use as directed    Dispense:  100 each    Refill:  12  . traZODone (DESYREL) 50 MG tablet    Sig: Take 1 tablet (50 mg total) by mouth at bedtime as needed for sleep.    Dispense:  30 tablet    Refill:  3  . metFORMIN (GLUCOPHAGE) 500 MG tablet    Sig: TAKE 2 TABLETS BY MOUTH 2 TIMES DAILY WITH A MEAL    Dispense:  120 tablet    Refill:  3  . lisinopril-hydrochlorothiazide (PRINZIDE,ZESTORETIC) 20-12.5 MG tablet    Sig: Take 1 tablet by mouth daily.    Dispense:  30 tablet    Refill:  3  . Insulin Glargine (LANTUS) 100 UNIT/ML Solostar Pen    Sig: Inject 50 Units into the skin daily.    Dispense:  60 pen    Refill:  3  . clopidogrel (PLAVIX) 75 MG tablet    Sig: Take 1 tablet (75 mg total) by mouth daily.    Dispense:  30 tablet    Refill:  3  . atorvastatin (LIPITOR) 20 MG tablet    Sig: Take 1 tablet (20 mg total) by mouth daily.    Dispense:  30 tablet    Refill:  3  . amLODipine (NORVASC) 10 MG tablet    Sig: Take 1 tablet (10 mg total) by mouth daily.    Dispense:  30 tablet    Refill:  3  . aspirin EC 81 MG tablet    Sig: Take 1 tablet (81 mg total) by mouth daily.    Dispense:  30 tablet    Refill:  3  . insulin aspart (NOVOLOG FLEXPEN) 100 UNIT/ML FlexPen    Sig: Inject 0-12 Units into the skin 3 (three) times daily with meals. As per sliding scale    Dispense:  15 mL  Refill:  3  . Insulin Pen Needle 31G X 8 MM MISC    Sig: 1 each by Does not apply route 4 (four) times daily - after meals and at bedtime.    Dispense:  120 each    Refill:  11    Follow-up: Return in about 3 months (around 11/17/2016) for Follow-up on diabetes mellitus.   Arnoldo Morale MD

## 2016-08-18 MED FILL — traZODone HCL 50 MG TABS: 50 | 30 days supply | Qty: 30 | Fill #0

## 2016-08-27 MED FILL — CLOPIDOGREL 75 MG TABLET: 75 | 30 days supply | Qty: 30 | Fill #0

## 2016-08-31 MED FILL — AMLODIPINE BESYLATE 10 MG T: 10 | 30 days supply | Qty: 30 | Fill #0

## 2016-09-10 MED FILL — metFORMIN HCL 500 MG TABS: 500 | 30 days supply | Qty: 120 | Fill #0

## 2016-09-17 ENCOUNTER — Encounter: Payer: Self-pay | Admitting: Family Medicine

## 2016-09-28 MED FILL — traZODone HCL 50 MG TABS: 50 | 30 days supply | Qty: 30 | Fill #1

## 2016-09-28 MED FILL — ATORVASTATIN 20 MG TABLET: 20 | 30 days supply | Qty: 30 | Fill #0

## 2016-09-30 MED FILL — LISINOPRIL-HCTZ 20-12.5 MG: 20-12.5 | 30 days supply | Qty: 30 | Fill #0

## 2016-09-30 MED FILL — AMLODIPINE BESYLATE 10 MG T: 10 | 30 days supply | Qty: 30 | Fill #1

## 2016-09-30 MED FILL — CLOPIDOGREL 75 MG TABLET: 75 | 30 days supply | Qty: 30 | Fill #1

## 2016-09-30 MED FILL — NOVOLOG FLEXPEN SYRINGE: 100 | 30 days supply | Qty: 15 | Fill #0

## 2016-09-30 MED FILL — LANTUS SOLOSTAR 100 UNITS/M: 100 | 30 days supply | Qty: 15 | Fill #0

## 2016-10-05 MED FILL — BD PEN NDL SHORT 31GX5/16: 31G X 8 MM | 30 days supply | Qty: 100 | Fill #0

## 2016-10-05 MED FILL — BD PEN NDL SHORT 31GX5/16": 31G X 8 MM | 30 days supply | Qty: 100 | Fill #0

## 2016-10-22 MED FILL — metFORMIN HCL 500 MG TABS: 500 | 30 days supply | Qty: 120 | Fill #1

## 2016-10-28 MED FILL — ATORVASTATIN 20 MG TABLET: 20 | 30 days supply | Qty: 30 | Fill #1

## 2016-10-28 MED FILL — LISINOPRIL-HCTZ 20-12.5 MG: 20-12.5 | 30 days supply | Qty: 30 | Fill #1

## 2016-11-09 MED FILL — AMLODIPINE BESYLATE 10 MG T: 10 | 30 days supply | Qty: 30 | Fill #2

## 2016-11-12 MED FILL — CLOPIDOGREL 75 MG TABLET: 75 | 30 days supply | Qty: 30 | Fill #3

## 2016-11-16 MED FILL — LANTUS SOLOSTAR 100 UNITS/M: 100 | 30 days supply | Qty: 15 | Fill #1

## 2016-11-23 MED FILL — traZODone HCL 50 MG TABS: 50 | 30 days supply | Qty: 30 | Fill #2

## 2016-12-03 MED FILL — metFORMIN HCL 500 MG TABS: 500 | 30 days supply | Qty: 120 | Fill #2

## 2016-12-03 MED FILL — LISINOPRIL-HCTZ 20-12.5 MG: 20-12.5 | 30 days supply | Qty: 30 | Fill #2

## 2016-12-15 MED FILL — ATORVASTATIN 20 MG TABLET: 20 | 30 days supply | Qty: 30 | Fill #2

## 2016-12-15 MED FILL — CLOPIDOGREL 75 MG TABLET: 75 | 30 days supply | Qty: 30 | Fill #2

## 2016-12-29 MED FILL — AMLODIPINE BESYLATE 10 MG T: 10 | 30 days supply | Qty: 30 | Fill #3

## 2017-01-05 MED FILL — LANTUS SOLOSTAR 100 UNITS/M: 100 | 30 days supply | Qty: 15 | Fill #2

## 2017-01-13 MED FILL — metFORMIN HCL 500 MG TABS: 500 | 30 days supply | Qty: 120 | Fill #3

## 2017-01-20 MED FILL — LISINOPRIL-HCTZ 20-12.5 MG: 20-12.5 | 30 days supply | Qty: 30 | Fill #3

## 2017-01-20 MED FILL — ATORVASTATIN 20 MG TABLET: 20 | 30 days supply | Qty: 30 | Fill #3

## 2017-02-01 MED FILL — AMLODIPINE BESYLATE 10 MG T: 10 | 30 days supply | Qty: 30 | Fill #3

## 2017-02-01 MED FILL — CLOPIDOGREL 75 MG TABLET: 75 | 30 days supply | Qty: 30 | Fill #3

## 2017-02-16 ENCOUNTER — Encounter: Payer: Self-pay | Admitting: Family Medicine

## 2017-02-16 ENCOUNTER — Ambulatory Visit: Payer: BLUE CROSS/BLUE SHIELD | Attending: Family Medicine | Admitting: Family Medicine

## 2017-02-16 VITALS — BP 133/76 | HR 82 | Temp 97.7°F | Resp 18 | Ht 71.0 in | Wt 206.0 lb

## 2017-02-16 DIAGNOSIS — G4709 Other insomnia: Secondary | ICD-10-CM | POA: Diagnosis not present

## 2017-02-16 DIAGNOSIS — E1165 Type 2 diabetes mellitus with hyperglycemia: Secondary | ICD-10-CM | POA: Insufficient documentation

## 2017-02-16 DIAGNOSIS — Z794 Long term (current) use of insulin: Secondary | ICD-10-CM | POA: Insufficient documentation

## 2017-02-16 DIAGNOSIS — I2510 Atherosclerotic heart disease of native coronary artery without angina pectoris: Secondary | ICD-10-CM | POA: Diagnosis not present

## 2017-02-16 DIAGNOSIS — E119 Type 2 diabetes mellitus without complications: Secondary | ICD-10-CM

## 2017-02-16 DIAGNOSIS — I10 Essential (primary) hypertension: Secondary | ICD-10-CM | POA: Insufficient documentation

## 2017-02-16 DIAGNOSIS — Z79899 Other long term (current) drug therapy: Secondary | ICD-10-CM | POA: Diagnosis not present

## 2017-02-16 DIAGNOSIS — B1920 Unspecified viral hepatitis C without hepatic coma: Secondary | ICD-10-CM | POA: Insufficient documentation

## 2017-02-16 DIAGNOSIS — Z1211 Encounter for screening for malignant neoplasm of colon: Secondary | ICD-10-CM | POA: Diagnosis not present

## 2017-02-16 DIAGNOSIS — Z7902 Long term (current) use of antithrombotics/antiplatelets: Secondary | ICD-10-CM | POA: Diagnosis not present

## 2017-02-16 DIAGNOSIS — Z7982 Long term (current) use of aspirin: Secondary | ICD-10-CM | POA: Diagnosis not present

## 2017-02-16 DIAGNOSIS — E7800 Pure hypercholesterolemia, unspecified: Secondary | ICD-10-CM | POA: Insufficient documentation

## 2017-02-16 DIAGNOSIS — G4700 Insomnia, unspecified: Secondary | ICD-10-CM | POA: Diagnosis not present

## 2017-02-16 LAB — GLUCOSE, POCT (MANUAL RESULT ENTRY): POC Glucose: 136 mg/dl — AB (ref 70–99)

## 2017-02-16 LAB — POCT GLYCOSYLATED HEMOGLOBIN (HGB A1C): Hemoglobin A1C: 7.6

## 2017-02-16 MED ORDER — TRAZODONE HCL 50 MG PO TABS: 50.0000 mg | ORAL_TABLET | Freq: Every evening | ORAL | 3 refills | Status: DC | PRN

## 2017-02-16 MED ORDER — AMLODIPINE BESYLATE 10 MG PO TABS: 10.0000 mg | ORAL_TABLET | Freq: Every day | ORAL | 3 refills | Status: DC

## 2017-02-16 MED ORDER — CLOPIDOGREL BISULFATE 75 MG PO TABS: 75.0000 mg | ORAL_TABLET | Freq: Every day | ORAL | 3 refills | Status: DC

## 2017-02-16 MED ORDER — ATORVASTATIN CALCIUM 20 MG PO TABS: 20.0000 mg | ORAL_TABLET | Freq: Every day | ORAL | 3 refills | Status: DC

## 2017-02-16 MED ORDER — METFORMIN HCL 500 MG PO TABS: ORAL_TABLET | ORAL | 3 refills | Status: DC

## 2017-02-16 MED ORDER — INSULIN ASPART 100 UNIT/ML FLEXPEN: 0.0000 [IU] | PEN_INJECTOR | Freq: Three times a day (TID) | SUBCUTANEOUS | 3 refills | Status: DC

## 2017-02-16 MED ORDER — LISINOPRIL-HYDROCHLOROTHIAZIDE 20-12.5 MG PO TABS: 1.0000 | ORAL_TABLET | Freq: Every day | ORAL | 3 refills | Status: DC

## 2017-02-16 MED ORDER — INSULIN GLARGINE 100 UNIT/ML SOLOSTAR PEN: 50.0000 [IU] | PEN_INJECTOR | Freq: Every day | SUBCUTANEOUS | 3 refills | Status: DC

## 2017-02-16 MED FILL — metFORMIN HCL 500 MG TABS: 500 | 30 days supply | Qty: 120 | Fill #0

## 2017-02-16 NOTE — Progress Notes (Signed)
Subjective:  Patient ID: Jesse Wilcox, male    DOB: [birth year]  Age: 66 y.o. MRN: [id number]  CC: Diabetes   HPI Jesse Wilcox Is a 66 year old male with a history of uncontrolled type 2 diabetes mellitus (A1c 7.6 which is down from 8.4 ), hypertension, hyperlipidemia, hepatitis C who comes in for a follow-up visit.  He has been compliant with his Lantus but does not use NovoLog because he feels his sugars are controlled. His 7 day average is 104. Denies hypoglycemia, numbness in extremities. He is up-to-date on an ophthalmology exam.  Compliant with antihypertensive as prescribed And is working on and low-sodium diet. Tolerating statin no complains of myalgia. Denies chest pain or shortness of breath and has no pedal edema.  Outpatient Medications Prior to Visit  Medication Sig Dispense Refill  . aspirin EC 81 MG tablet Take 1 tablet (81 mg total) by mouth daily. 30 tablet 3  . Blood Glucose Monitoring Suppl (TRUE METRIX METER) w/Device KIT Use to check blood sugar as directed 1 kit 0  . glucose blood (TRUE METRIX BLOOD GLUCOSE TEST) test strip Use as instructed 100 each 12  . Insulin Pen Needle 31G X 8 MM MISC 1 each by Does not apply route 4 (four) times daily - after meals and at bedtime. 120 each 11  . Multiple Vitamin (MULTIVITAMIN WITH MINERALS) TABS Take 1 tablet by mouth daily.    . TRUEPLUS LANCETS 28G MISC Use as directed 100 each 12  . amLODipine (NORVASC) 10 MG tablet Take 1 tablet (10 mg total) by mouth daily. 30 tablet 3  . atorvastatin (LIPITOR) 20 MG tablet Take 1 tablet (20 mg total) by mouth daily. 30 tablet 3  . clopidogrel (PLAVIX) 75 MG tablet Take 1 tablet (75 mg total) by mouth daily. 30 tablet 3  . insulin aspart (NOVOLOG FLEXPEN) 100 UNIT/ML FlexPen Inject 0-12 Units into the skin 3 (three) times daily with meals. As per sliding scale 15 mL 3  . Insulin Glargine (LANTUS) 100 UNIT/ML Solostar Pen Inject 50 Units into the skin daily. 60 pen 3  .  lisinopril-hydrochlorothiazide (PRINZIDE,ZESTORETIC) 20-12.5 MG tablet Take 1 tablet by mouth daily. 30 tablet 3  . metFORMIN (GLUCOPHAGE) 500 MG tablet TAKE 2 TABLETS BY MOUTH 2 TIMES DAILY WITH A MEAL 120 tablet 3  . traZODone (DESYREL) 50 MG tablet Take 1 tablet (50 mg total) by mouth at bedtime as needed for sleep. 30 tablet 3  . Miconazole POWD Applied to area of irritation around the foreskin twice daily (Patient not taking: Reported on 08/17/2016) 1 Bottle 2   No facility-administered medications prior to visit.     ROS Review of Systems  Constitutional: Negative for activity change and appetite change.  HENT: Negative for sinus pressure and sore throat.   Eyes: Negative for visual disturbance.  Respiratory: Negative for cough, chest tightness and shortness of breath.   Cardiovascular: Negative for chest pain and leg swelling.  Gastrointestinal: Negative for abdominal distention, abdominal pain, constipation and diarrhea.  Endocrine: Negative.   Genitourinary: Negative for dysuria.  Musculoskeletal: Negative for joint swelling and myalgias.  Skin: Negative for rash.  Allergic/Immunologic: Negative.   Neurological: Negative for weakness, light-headedness and numbness.  Psychiatric/Behavioral: Negative for dysphoric mood and suicidal ideas.    Objective:  BP 133/76 (BP Location: Right Arm, Patient Position: Sitting, Cuff Size: Normal)   Pulse 82   Temp 97.7 F (36.5 C) (Oral)   Resp 18   Ht 5'  11" (1.803 m)   Wt 206 lb (93.4 kg)   SpO2 96%   BMI 28.73 kg/m   BP/Weight 02/16/2017 08/17/2016 12/02/8655  Systolic BP 846 962 952  Diastolic BP 76 71 81  Wt. (Lbs) 206 204.8 202.8  BMI 28.73 28.56 27.89      Physical Exam Constitutional: He is oriented to person, place, and time. He appears well-developed and well-nourished.  Cardiovascular: Normal rate, normal heart sounds and intact distal pulses.   No murmur heard. Pulmonary/Chest: Effort normal and breath sounds  normal. He has no wheezes. He has no rales. He exhibits no tenderness.  Abdominal: Soft. Bowel sounds are normal. He exhibits no distension and no mass. There is no tenderness.  Musculoskeletal: Normal range of motion.  Neurological: He is alert and oriented to person, place, and time.  Skin: Skin is warm and dry.  Psychiatric: He has a normal mood and affect.   Lab Results  Component Value Date   HGBA1C 7.6 02/16/2017    CMP Latest Ref Rng & Units 04/24/2016 04/22/2015 08/08/2014  Glucose 65 - 99 mg/dL 134(H) 193(H) 120(H)  BUN 7 - 25 mg/dL '11 11 11  '$ Creatinine 0.70 - 1.25 mg/dL 0.80 0.75 0.8  Sodium 135 - 146 mmol/L 138 139 136  Potassium 3.5 - 5.3 mmol/L 4.4 4.1 4.0  Chloride 98 - 110 mmol/L 98 107 102  CO2 20 - 31 mmol/L '26 25 25  '$ Calcium 8.6 - 10.3 mg/dL 9.5 9.6 9.7  Total Protein 6.1 - 8.1 g/dL 7.6 7.6 8.5(H)  Total Bilirubin 0.2 - 1.2 mg/dL 1.0 0.8 0.7  Alkaline Phos 40 - 115 U/L 92 100 85  AST 10 - 35 U/L '21 24 28  '$ ALT 9 - 46 U/L 47(H) 56(H) 37    Lipid Panel     Component Value Date/Time   CHOL 139 04/24/2016 1033   TRIG 191 (H) 04/24/2016 1033   HDL 34 (L) 04/24/2016 1033   CHOLHDL 4.1 04/24/2016 1033   VLDL 38 (H) 04/24/2016 1033   LDLCALC 67 04/24/2016 1033   LDLDIRECT 140.6 08/08/2014 1024     Assessment & Plan:   1. Type 2 diabetes mellitus without complication, without long-term current use of insulin (HCC) A1c of 7.6 which has improved from 8.4 previously Continue current medications Diabetic diet - Glucose (CBG) - POCT A1C - CMP14+EGFR; Future - Lipid panel; Future - Microalbumin/Creatinine Ratio, Urine; Future - Insulin Glargine (LANTUS) 100 UNIT/ML Solostar Pen; Inject 50 Units into the skin daily.  Dispense: 60 pen; Refill: 3 - insulin aspart (NOVOLOG FLEXPEN) 100 UNIT/ML FlexPen; Inject 0-12 Units into the skin 3 (three) times daily with meals. As per sliding scale  Dispense: 15 mL; Refill: 3  2. Screening for colon cancer - Ambulatory  referral to Gastroenterology  3. Other insomnia Controlled - traZODone (DESYREL) 50 MG tablet; Take 1 tablet (50 mg total) by mouth at bedtime as needed for sleep.  Dispense: 30 tablet; Refill: 3  4. Essential hypertension Controlled Low-sodium diet - lisinopril-hydrochlorothiazide (PRINZIDE,ZESTORETIC) 20-12.5 MG tablet; Take 1 tablet by mouth daily.  Dispense: 30 tablet; Refill: 3 - amLODipine (NORVASC) 10 MG tablet; Take 1 tablet (10 mg total) by mouth daily.  Dispense: 30 tablet; Refill: 3  5. ASCVD (arteriosclerotic cardiovascular disease) - clopidogrel (PLAVIX) 75 MG tablet; Take 1 tablet (75 mg total) by mouth daily.  Dispense: 30 tablet; Refill: 3  6. Pure hypercholesterolemia Low cholesterol diet - atorvastatin (LIPITOR) 20 MG tablet; Take 1 tablet (20 mg total)  by mouth daily.  Dispense: 30 tablet; Refill: 3   Meds ordered this encounter  Medications  . traZODone (DESYREL) 50 MG tablet    Sig: Take 1 tablet (50 mg total) by mouth at bedtime as needed for sleep.    Dispense:  30 tablet    Refill:  3  . metFORMIN (GLUCOPHAGE) 500 MG tablet    Sig: TAKE 2 TABLETS BY MOUTH 2 TIMES DAILY WITH A MEAL    Dispense:  120 tablet    Refill:  3  . lisinopril-hydrochlorothiazide (PRINZIDE,ZESTORETIC) 20-12.5 MG tablet    Sig: Take 1 tablet by mouth daily.    Dispense:  30 tablet    Refill:  3  . Insulin Glargine (LANTUS) 100 UNIT/ML Solostar Pen    Sig: Inject 50 Units into the skin daily.    Dispense:  60 pen    Refill:  3  . insulin aspart (NOVOLOG FLEXPEN) 100 UNIT/ML FlexPen    Sig: Inject 0-12 Units into the skin 3 (three) times daily with meals. As per sliding scale    Dispense:  15 mL    Refill:  3  . clopidogrel (PLAVIX) 75 MG tablet    Sig: Take 1 tablet (75 mg total) by mouth daily.    Dispense:  30 tablet    Refill:  3  . atorvastatin (LIPITOR) 20 MG tablet    Sig: Take 1 tablet (20 mg total) by mouth daily.    Dispense:  30 tablet    Refill:  3  . amLODipine  (NORVASC) 10 MG tablet    Sig: Take 1 tablet (10 mg total) by mouth daily.    Dispense:  30 tablet    Refill:  3    Follow-up: Return in about 3 months (around 05/18/2017) for follow up on Diabetes Mellitus.   Arnoldo Morale MD

## 2017-02-16 NOTE — Patient Instructions (Signed)
Diabetes Mellitus and Food It is important for you to manage your blood sugar (glucose) level. Your blood glucose level can be greatly affected by what you eat. Eating healthier foods in the appropriate amounts throughout the day at about the same time each day will help you control your blood glucose level. It can also help slow or prevent worsening of your diabetes mellitus. Healthy eating may even help you improve the level of your blood pressure and reach or maintain a healthy weight. General recommendations for healthful eating and cooking habits include:  Eating meals and snacks regularly. Avoid going long periods of time without eating to lose weight.  Eating a diet that consists mainly of plant-based foods, such as fruits, vegetables, nuts, legumes, and whole grains.  Using low-heat cooking methods, such as baking, instead of high-heat cooking methods, such as deep frying.  Work with your dietitian to make sure you understand how to use the Nutrition Facts information on food labels. How can food affect me? Carbohydrates Carbohydrates affect your blood glucose level more than any other type of food. Your dietitian will help you determine how many carbohydrates to eat at each meal and teach you how to count carbohydrates. Counting carbohydrates is important to keep your blood glucose at a healthy level, especially if you are using insulin or taking certain medicines for diabetes mellitus. Alcohol Alcohol can cause sudden decreases in blood glucose (hypoglycemia), especially if you use insulin or take certain medicines for diabetes mellitus. Hypoglycemia can be a life-threatening condition. Symptoms of hypoglycemia (sleepiness, dizziness, and disorientation) are similar to symptoms of having too much alcohol. If your health care provider has given you approval to drink alcohol, do so in moderation and use the following guidelines:  Women should not have more than one drink per day, and men  should not have more than two drinks per day. One drink is equal to: ? 12 oz of beer. ? 5 oz of wine. ? 1 oz of hard liquor.  Do not drink on an empty stomach.  Keep yourself hydrated. Have water, diet soda, or unsweetened iced tea.  Regular soda, juice, and other mixers might contain a lot of carbohydrates and should be counted.  What foods are not recommended? As you make food choices, it is important to remember that all foods are not the same. Some foods have fewer nutrients per serving than other foods, even though they might have the same number of calories or carbohydrates. It is difficult to get your body what it needs when you eat foods with fewer nutrients. Examples of foods that you should avoid that are high in calories and carbohydrates but low in nutrients include:  Trans fats (most processed foods list trans fats on the Nutrition Facts label).  Regular soda.  Juice.  Candy.  Sweets, such as cake, pie, doughnuts, and cookies.  Fried foods.  What foods can I eat? Eat nutrient-rich foods, which will nourish your body and keep you healthy. The food you should eat also will depend on several factors, including:  The calories you need.  The medicines you take.  Your weight.  Your blood glucose level.  Your blood pressure level.  Your cholesterol level.  You should eat a variety of foods, including:  Protein. ? Lean cuts of meat. ? Proteins low in saturated fats, such as fish, egg whites, and beans. Avoid processed meats.  Fruits and vegetables. ? Fruits and vegetables that may help control blood glucose levels, such as apples,   mangoes, and yams.  Dairy products. ? Choose fat-free or low-fat dairy products, such as milk, yogurt, and cheese.  Grains, bread, pasta, and rice. ? Choose whole grain products, such as multigrain bread, whole oats, and brown rice. These foods may help control blood pressure.  Fats. ? Foods containing healthful fats, such as  nuts, avocado, olive oil, canola oil, and fish.  Does everyone with diabetes mellitus have the same meal plan? Because every person with diabetes mellitus is different, there is not one meal plan that works for everyone. It is very important that you meet with a dietitian who will help you create a meal plan that is just right for you. This information is not intended to replace advice given to you by your health care provider. Make sure you discuss any questions you have with your health care provider. Document Released: 07/16/2005 Document Revised: 03/26/2016 Document Reviewed: 09/15/2013 Elsevier Interactive Patient Education  2017 [employer]  

## 2017-02-16 NOTE — Progress Notes (Signed)
Patient is here for DM  Patient denies pain at this time.  Patient has taken medication today. Patient has had coffee today.

## 2017-02-17 ENCOUNTER — Ambulatory Visit: Payer: BLUE CROSS/BLUE SHIELD | Attending: Family Medicine

## 2017-02-17 DIAGNOSIS — E119 Type 2 diabetes mellitus without complications: Secondary | ICD-10-CM | POA: Diagnosis not present

## 2017-02-17 NOTE — Progress Notes (Signed)
Patient here for lab visit only 

## 2017-02-18 ENCOUNTER — Telehealth: Payer: Self-pay

## 2017-02-18 LAB — LIPID PANEL
CHOLESTEROL TOTAL: 146 mg/dL (ref 100–199)
Chol/HDL Ratio: 4.6 ratio (ref 0.0–5.0)
HDL: 32 mg/dL — AB (ref >39)
LDL CALC: 78 mg/dL (ref 0–99)
Triglycerides: 181 mg/dL — ABNORMAL HIGH (ref 0–149)
VLDL CHOLESTEROL CAL: 36 mg/dL (ref 5–40)

## 2017-02-18 LAB — CMP14+EGFR
ALBUMIN: 4.5 g/dL (ref 3.6–4.8)
ALK PHOS: 101 IU/L (ref 39–117)
ALT: 37 IU/L (ref 0–44)
AST: 23 IU/L (ref 0–40)
Albumin/Globulin Ratio: 1.4 (ref 1.2–2.2)
BILIRUBIN TOTAL: 0.9 mg/dL (ref 0.0–1.2)
BUN / CREAT RATIO: 13 (ref 10–24)
BUN: 11 mg/dL (ref 8–27)
CHLORIDE: 99 mmol/L (ref 96–106)
CO2: 25 mmol/L (ref 18–29)
Calcium: 9.4 mg/dL (ref 8.6–10.2)
Creatinine, Ser: 0.87 mg/dL (ref 0.76–1.27)
GFR calc Af Amer: 105 mL/min/{1.73_m2} (ref >59)
GFR calc non Af Amer: 91 mL/min/{1.73_m2} (ref >59)
GLUCOSE: 122 mg/dL — AB (ref 65–99)
Globulin, Total: 3.3 g/dL (ref 1.5–4.5)
Potassium: 4.4 mmol/L (ref 3.5–5.2)
Sodium: 141 mmol/L (ref 134–144)
TOTAL PROTEIN: 7.8 g/dL (ref 6.0–8.5)

## 2017-02-18 LAB — MICROALBUMIN / CREATININE URINE RATIO
Creatinine, Urine: 159.8 mg/dL
MICROALBUM., U, RANDOM: 8.9 ug/mL
Microalb/Creat Ratio: 5.6 mg/g creat (ref 0.0–30.0)

## 2017-02-18 NOTE — Telephone Encounter (Signed)
Writer called patient and informed him of his lab results per Dr. Jarold Song.  Patient stated understanding.

## 2017-02-18 NOTE — Telephone Encounter (Signed)
-----   Message from Arnoldo Morale, MD sent at 02/18/2017  4:15 PM EDT ----- Labs are stable

## 2017-03-02 MED FILL — LISINOPRIL-HCTZ 20-12.5 MG: 20-12.5 | 30 days supply | Qty: 30 | Fill #0

## 2017-03-02 MED FILL — ATORVASTATIN 20 MG TABLET: 20 | 30 days supply | Qty: 30 | Fill #0

## 2017-03-02 MED FILL — LANTUS SOLOSTAR 100 UNITS/M: 100 | 30 days supply | Qty: 15 | Fill #0

## 2017-03-16 MED FILL — CLOPIDOGREL 75 MG TABLET: 75 | 30 days supply | Qty: 30 | Fill #0

## 2017-03-22 MED FILL — AMLODIPINE BESYLATE 10 MG T: 10 | 30 days supply | Qty: 30 | Fill #0

## 2017-04-05 MED FILL — LISINOPRIL-HCTZ 20-12.5 MG: 20-12.5 | 30 days supply | Qty: 30 | Fill #1

## 2017-04-05 MED FILL — metFORMIN HCL 500 MG TABS: 500 | 30 days supply | Qty: 120 | Fill #1

## 2017-04-19 ENCOUNTER — Encounter: Payer: Self-pay | Admitting: Family Medicine

## 2017-04-19 MED FILL — BD PEN NDL SHORT 31GX5/16: 31G X 8 MM | 30 days supply | Qty: 100 | Fill #1

## 2017-04-19 MED FILL — LANTUS SOLOSTAR 100 UNITS/M: 100 | 30 days supply | Qty: 15 | Fill #3

## 2017-04-19 MED FILL — BD PEN NDL SHORT 31GX5/16": 31G X 8 MM | 30 days supply | Qty: 100 | Fill #1

## 2017-04-19 MED FILL — CLOPIDOGREL 75 MG TABLET: 75 | 30 days supply | Qty: 30 | Fill #1

## 2017-04-19 MED FILL — ATORVASTATIN 20 MG TABLET: 20 | 30 days supply | Qty: 30 | Fill #1

## 2017-04-26 MED FILL — AMLODIPINE BESYLATE 10 MG T: 10 | 30 days supply | Qty: 30 | Fill #1

## 2017-05-10 ENCOUNTER — Other Ambulatory Visit: Payer: Self-pay | Admitting: Emergency Medicine

## 2017-05-10 MED FILL — LISINOPRIL-HCTZ 20-12.5 MG: 20-12.5 | 30 days supply | Qty: 30 | Fill #2

## 2017-05-10 MED FILL — metFORMIN HCL 500 MG TABS: 500 | 30 days supply | Qty: 120 | Fill #2

## 2017-05-14 MED FILL — traZODone HCL 50 MG TABS: 50 | 30 days supply | Qty: 30 | Fill #0

## 2017-05-21 ENCOUNTER — Ambulatory Visit: Payer: Medicare Other | Attending: Family Medicine | Admitting: Family Medicine

## 2017-05-21 ENCOUNTER — Encounter: Payer: Self-pay | Admitting: Family Medicine

## 2017-05-21 VITALS — BP 134/79 | HR 91 | Temp 97.7°F | Resp 18 | Ht 71.0 in | Wt 205.0 lb

## 2017-05-21 DIAGNOSIS — Z7902 Long term (current) use of antithrombotics/antiplatelets: Secondary | ICD-10-CM | POA: Diagnosis not present

## 2017-05-21 DIAGNOSIS — Z7982 Long term (current) use of aspirin: Secondary | ICD-10-CM | POA: Diagnosis not present

## 2017-05-21 DIAGNOSIS — I2510 Atherosclerotic heart disease of native coronary artery without angina pectoris: Secondary | ICD-10-CM

## 2017-05-21 DIAGNOSIS — I6529 Occlusion and stenosis of unspecified carotid artery: Secondary | ICD-10-CM | POA: Diagnosis not present

## 2017-05-21 DIAGNOSIS — E7800 Pure hypercholesterolemia, unspecified: Secondary | ICD-10-CM

## 2017-05-21 DIAGNOSIS — G4709 Other insomnia: Secondary | ICD-10-CM

## 2017-05-21 DIAGNOSIS — B182 Chronic viral hepatitis C: Secondary | ICD-10-CM | POA: Insufficient documentation

## 2017-05-21 DIAGNOSIS — I10 Essential (primary) hypertension: Secondary | ICD-10-CM | POA: Diagnosis not present

## 2017-05-21 DIAGNOSIS — E119 Type 2 diabetes mellitus without complications: Secondary | ICD-10-CM | POA: Diagnosis not present

## 2017-05-21 DIAGNOSIS — G4700 Insomnia, unspecified: Secondary | ICD-10-CM | POA: Diagnosis not present

## 2017-05-21 DIAGNOSIS — Z794 Long term (current) use of insulin: Secondary | ICD-10-CM | POA: Insufficient documentation

## 2017-05-21 DIAGNOSIS — E785 Hyperlipidemia, unspecified: Secondary | ICD-10-CM | POA: Insufficient documentation

## 2017-05-21 DIAGNOSIS — E1165 Type 2 diabetes mellitus with hyperglycemia: Secondary | ICD-10-CM | POA: Insufficient documentation

## 2017-05-21 LAB — GLUCOSE, POCT (MANUAL RESULT ENTRY): POC GLUCOSE: 133 mg/dL — AB (ref 70–99)

## 2017-05-21 LAB — POCT GLYCOSYLATED HEMOGLOBIN (HGB A1C): Hemoglobin A1C: 7.4

## 2017-05-21 MED ORDER — AMLODIPINE BESYLATE 10 MG PO TABS: 10.0000 mg | ORAL_TABLET | Freq: Every day | ORAL | 3 refills | Status: DC

## 2017-05-21 MED ORDER — TRAZODONE HCL 50 MG PO TABS: 50.0000 mg | ORAL_TABLET | Freq: Every evening | ORAL | 3 refills | Status: DC | PRN

## 2017-05-21 MED ORDER — METFORMIN HCL 500 MG PO TABS: ORAL_TABLET | ORAL | 3 refills | Status: DC

## 2017-05-21 MED ORDER — ATORVASTATIN CALCIUM 20 MG PO TABS: 20.0000 mg | ORAL_TABLET | Freq: Every day | ORAL | 3 refills | Status: DC

## 2017-05-21 MED ORDER — INSULIN ASPART 100 UNIT/ML FLEXPEN: 0.0000 [IU] | PEN_INJECTOR | Freq: Three times a day (TID) | SUBCUTANEOUS | 3 refills | Status: DC

## 2017-05-21 MED ORDER — CLOPIDOGREL BISULFATE 75 MG PO TABS
75.0000 mg | ORAL_TABLET | Freq: Every day | ORAL | 3 refills | Status: DC
Start: 2017-05-21 — End: 2017-11-09

## 2017-05-21 MED ORDER — INSULIN GLARGINE 100 UNIT/ML SOLOSTAR PEN: 50.0000 [IU] | PEN_INJECTOR | Freq: Every day | SUBCUTANEOUS | 3 refills | Status: DC

## 2017-05-21 MED ORDER — LISINOPRIL-HYDROCHLOROTHIAZIDE 20-12.5 MG PO TABS: 1.0000 | ORAL_TABLET | Freq: Every day | ORAL | 3 refills | Status: DC

## 2017-05-21 MED FILL — ATORVASTATIN 20 MG TABLET: 20 | 30 days supply | Qty: 30 | Fill #0

## 2017-05-21 MED FILL — CLOPIDOGREL 75 MG TABLET: 75 | 30 days supply | Qty: 30 | Fill #0

## 2017-05-21 NOTE — Progress Notes (Signed)
Subjective:  Patient ID: Jesse Wilcox, male    DOB: 1950/12/14  Age: 66 y.o. MRN: [id number]  CC: Diabetes   HPI Jesse Wilcox Is a 66 year old male with a history of uncontrolled type 2 diabetes mellitus (A1c 7.4), hypertension, hyperlipidemia, hepatitis C who comes in for a follow-up visit.  He has been compliant with his Lantus but does not use NovoLog because he feels his sugars are controlled. His 7 day average is 304. Denies hypoglycemia, numbness in extremities. He is up-to-date on an ophthalmology exam.  Compliant with antihypertensive as prescribed And is working on and low-sodium diet. Tolerating statin no complains of myalgia. Denies chest pain or shortness of breath and has no pedal edema.  He has no acute concerns today.  Past Medical History:  Diagnosis Date  . Carotid artery occlusion   . Chronic hepatitis C ([place])   . Diabetes mellitus     Past Surgical History:  Procedure Laterality Date  . Aortic arch angiogram  04-15-12   By Dr. Einar Gip  . ARCH AORTOGRAM  04/15/2012   Procedure: ARCH AORTOGRAM;  Surgeon: Laverda Page, MD;  Location[facility] CATH LAB;  Service: Cardiovascular;;  . CAROTID ANGIOGRAM N/A 04/15/2012   Procedure: CAROTID ANGIOGRAM;  Surgeon: Laverda Page, MD;  Location: [facility] CATH LAB;  Service: Cardiovascular;  Laterality: N/A;  . CAROTID STENT INSERTION N/A 08/23/2012   Procedure: CAROTID STENT INSERTION;  Surgeon: Elam Dutch, MD;  Location: [facility] CATH LAB;  Service: Cardiovascular;  Laterality: N/A;    No Known Allergies   Outpatient Medications Prior to Visit  Medication Sig Dispense Refill  . aspirin EC 81 MG tablet Take 1 tablet (81 mg total) by mouth daily. 30 tablet 3  . Blood Glucose Monitoring Suppl (TRUE METRIX METER) w/Device KIT Use to check blood sugar as directed 1 kit 0  . glucose blood (TRUE METRIX BLOOD GLUCOSE TEST) test strip Use as instructed 100 each 12  . Insulin Pen Needle 31G X 8 MM MISC 1 each by Does not  apply route 4 (four) times daily - after meals and at bedtime. 120 each 11  . Multiple Vitamin (MULTIVITAMIN WITH MINERALS) TABS Take 1 tablet by mouth daily.    Marland Kitchen amLODipine (NORVASC) 10 MG tablet Take 1 tablet (10 mg total) by mouth daily. 30 tablet 3  . atorvastatin (LIPITOR) 20 MG tablet Take 1 tablet (20 mg total) by mouth daily. 30 tablet 3  . clopidogrel (PLAVIX) 75 MG tablet Take 1 tablet (75 mg total) by mouth daily. 30 tablet 3  . insulin aspart (NOVOLOG FLEXPEN) 100 UNIT/ML FlexPen Inject 0-12 Units into the skin 3 (three) times daily with meals. As per sliding scale 15 mL 3  . Insulin Glargine (LANTUS) 100 UNIT/ML Solostar Pen Inject 50 Units into the skin daily. 60 pen 3  . lisinopril-hydrochlorothiazide (PRINZIDE,ZESTORETIC) 20-12.5 MG tablet Take 1 tablet by mouth daily. 30 tablet 3  . metFORMIN (GLUCOPHAGE) 500 MG tablet TAKE 2 TABLETS BY MOUTH 2 TIMES DAILY WITH A MEAL 120 tablet 3  . Miconazole POWD Applied to area of irritation around the foreskin twice daily (Patient not taking: Reported on 08/17/2016) 1 Bottle 2  . TRUEPLUS LANCETS 28G MISC Use as directed 100 each 12  . traZODone (DESYREL) 50 MG tablet Take 1 tablet (50 mg total) by mouth at bedtime as needed for sleep. 30 tablet 3   No facility-administered medications prior to visit.     ROS Review of  Systems Constitutional: Negative for activity change and appetite change.  HENT: Negative for sinus pressure and sore throat.   Eyes: Negative for visual disturbance.  Respiratory: Negative for cough, chest tightness and shortness of breath.   Cardiovascular: Negative for chest pain and leg swelling.  Gastrointestinal: Negative for abdominal distention, abdominal pain, constipation and diarrhea.  Endocrine: Negative.   Genitourinary: Negative for dysuria.  Musculoskeletal: Negative for joint swelling and myalgias.  Skin: Negative for rash.  Allergic/Immunologic: Negative.   Neurological: Negative for weakness,  light-headedness and numbness.  Psychiatric/Behavioral: Negative for dysphoric mood and suicidal ideas.   Objective:  BP 134/79 (BP Location: Left Arm, Patient Position: Sitting, Cuff Size: Normal)   Pulse 91   Temp 97.7 F (36.5 C) (Oral)   Resp 18   Ht '5\' 11"'$  (1.803 m)   Wt 205 lb (93 kg)   SpO2 96%   BMI 28.59 kg/m   BP/Weight 05/21/2017 02/16/2017 66/04/3015  Systolic BP 010 932 355  Diastolic BP 79 76 71  Wt. (Lbs) 205 206 204.8  BMI 28.59 28.73 28.56      Physical Exam Constitutional: He is oriented to person, place, and time. He appears well-developed and well-nourished.  Cardiovascular: Normal rate, normal heart sounds and intact distal pulses.   No murmur heard. Pulmonary/Chest: Effort normal and breath sounds normal. He has no wheezes. He has no rales. He exhibits no tenderness.  Abdominal: Soft. Bowel sounds are normal. He exhibits no distension and no mass. There is no tenderness.  Musculoskeletal: Normal range of motion.  Neurological: He is alert and oriented to person, place, and time.  Skin: Skin is warm and dry.  Psychiatric: He has a normal mood and affect.   Lab Results  Component Value Date   HGBA1C 7.4 05/21/2017    Assessment & Plan:   1. Type 2 diabetes mellitus without complication, without long-term current use of insulin (HCC) A1c of 7.4 Will work towards goal of <7.0 Lifestyle modification, diabetic diet - Glucose (CBG) - POCT A1C - POCT UA - Microalbumin - Insulin Glargine (LANTUS) 100 UNIT/ML Solostar Pen; Inject 50 Units into the skin daily.  Dispense: 60 pen; Refill: 3 - insulin aspart (NOVOLOG FLEXPEN) 100 UNIT/ML FlexPen; Inject 0-12 Units into the skin 3 (three) times daily with meals. As per sliding scale  Dispense: 15 mL; Refill: 3 - metFORMIN (GLUCOPHAGE) 500 MG tablet; TAKE 2 TABLETS BY MOUTH 2 TIMES DAILY WITH A MEAL  Dispense: 120 tablet; Refill: 3 - CMP14+EGFR - Lipid panel - Ambulatory referral to Podiatry  2. ASCVD  (arteriosclerotic cardiovascular disease) - clopidogrel (PLAVIX) 75 MG tablet; Take 1 tablet (75 mg total) by mouth daily.  Dispense: 30 tablet; Refill: 3  3. Other insomnia - traZODone (DESYREL) 50 MG tablet; Take 1 tablet (50 mg total) by mouth at bedtime as needed for sleep.  Dispense: 30 tablet; Refill: 3  4. Pure hypercholesterolemia - atorvastatin (LIPITOR) 20 MG tablet; Take 1 tablet (20 mg total) by mouth daily.  Dispense: 30 tablet; Refill: 3  5. Essential hypertension Slightly elevated above goal of <130/80 Low sodium diet - amLODipine (NORVASC) 10 MG tablet; Take 1 tablet (10 mg total) by mouth daily.  Dispense: 30 tablet; Refill: 3 - lisinopril-hydrochlorothiazide (PRINZIDE,ZESTORETIC) 20-12.5 MG tablet; Take 1 tablet by mouth daily.  Dispense: 30 tablet; Refill: 3   Meds ordered this encounter  Medications  . clopidogrel (PLAVIX) 75 MG tablet    Sig: Take 1 tablet (75 mg total) by mouth daily.  Dispense:  30 tablet    Refill:  3  . Insulin Glargine (LANTUS) 100 UNIT/ML Solostar Pen    Sig: Inject 50 Units into the skin daily.    Dispense:  60 pen    Refill:  3  . traZODone (DESYREL) 50 MG tablet    Sig: Take 1 tablet (50 mg total) by mouth at bedtime as needed for sleep.    Dispense:  30 tablet    Refill:  3  . atorvastatin (LIPITOR) 20 MG tablet    Sig: Take 1 tablet (20 mg total) by mouth daily.    Dispense:  30 tablet    Refill:  3  . amLODipine (NORVASC) 10 MG tablet    Sig: Take 1 tablet (10 mg total) by mouth daily.    Dispense:  30 tablet    Refill:  3  . lisinopril-hydrochlorothiazide (PRINZIDE,ZESTORETIC) 20-12.5 MG tablet    Sig: Take 1 tablet by mouth daily.    Dispense:  30 tablet    Refill:  3  . insulin aspart (NOVOLOG FLEXPEN) 100 UNIT/ML FlexPen    Sig: Inject 0-12 Units into the skin 3 (three) times daily with meals. As per sliding scale    Dispense:  15 mL    Refill:  3  . metFORMIN (GLUCOPHAGE) 500 MG tablet    Sig: TAKE 2 TABLETS BY  MOUTH 2 TIMES DAILY WITH A MEAL    Dispense:  120 tablet    Refill:  3    Follow-up: Return in about 3 months (around 08/21/2017) for follow up on Diabetes.   This note has been created with Surveyor, quantity. Any transcriptional errors are unintentional.     Arnoldo Morale MD

## 2017-05-22 LAB — CMP14+EGFR
A/G RATIO: 1.5 (ref 1.2–2.2)
ALBUMIN: 4.5 g/dL (ref 3.6–4.8)
ALK PHOS: 102 IU/L (ref 39–117)
ALT: 58 IU/L — ABNORMAL HIGH (ref 0–44)
AST: 40 IU/L (ref 0–40)
BILIRUBIN TOTAL: 0.5 mg/dL (ref 0.0–1.2)
BUN/Creatinine Ratio: 11 (ref 10–24)
BUN: 10 mg/dL (ref 8–27)
CO2: 24 mmol/L (ref 20–29)
CREATININE: 0.93 mg/dL (ref 0.76–1.27)
Calcium: 9.6 mg/dL (ref 8.6–10.2)
Chloride: 101 mmol/L (ref 96–106)
GFR calc Af Amer: 99 mL/min/{1.73_m2} (ref >59)
GFR, EST NON AFRICAN AMERICAN: 86 mL/min/{1.73_m2} (ref >59)
GLUCOSE: 132 mg/dL — AB (ref 65–99)
Globulin, Total: 3.1 g/dL (ref 1.5–4.5)
Potassium: 4.4 mmol/L (ref 3.5–5.2)
Sodium: 139 mmol/L (ref 134–144)
TOTAL PROTEIN: 7.6 g/dL (ref 6.0–8.5)

## 2017-05-22 LAB — LIPID PANEL
CHOL/HDL RATIO: 3.9 ratio (ref 0.0–5.0)
Cholesterol, Total: 153 mg/dL (ref 100–199)
HDL: 39 mg/dL — AB (ref >39)
LDL CALC: 89 mg/dL (ref 0–99)
TRIGLYCERIDES: 123 mg/dL (ref 0–149)
VLDL Cholesterol Cal: 25 mg/dL (ref 5–40)

## 2017-05-25 ENCOUNTER — Telehealth: Payer: Self-pay | Admitting: Family Medicine

## 2017-05-25 NOTE — Telephone Encounter (Signed)
Student RMA called patient and patient confirmed his name DOB and address. Student then let the patient know that his labs where normal.

## 2017-05-25 NOTE — Telephone Encounter (Signed)
-----   Message from Trecia Rogers, [place] sent at 05/25/2017  2:23 PM EDT ----- Patients labs are normal

## 2017-05-26 MED FILL — LANTUS SOLOSTAR 100 UNITS/M: 100 | 30 days supply | Qty: 15 | Fill #1

## 2017-06-04 MED FILL — AMLODIPINE BESYLATE 10 MG T: 10 | 30 days supply | Qty: 30 | Fill #2

## 2017-06-15 ENCOUNTER — Ambulatory Visit (INDEPENDENT_AMBULATORY_CARE_PROVIDER_SITE_OTHER): Payer: Medicare Other | Admitting: Podiatry

## 2017-06-15 ENCOUNTER — Encounter: Payer: Self-pay | Admitting: Podiatry

## 2017-06-15 VITALS — BP 154/87 | HR 85

## 2017-06-15 DIAGNOSIS — B351 Tinea unguium: Secondary | ICD-10-CM

## 2017-06-15 DIAGNOSIS — E1169 Type 2 diabetes mellitus with other specified complication: Secondary | ICD-10-CM

## 2017-06-15 DIAGNOSIS — I2510 Atherosclerotic heart disease of native coronary artery without angina pectoris: Secondary | ICD-10-CM | POA: Diagnosis not present

## 2017-06-15 DIAGNOSIS — M2010 Hallux valgus (acquired), unspecified foot: Secondary | ICD-10-CM | POA: Diagnosis not present

## 2017-06-15 DIAGNOSIS — E1142 Type 2 diabetes mellitus with diabetic polyneuropathy: Secondary | ICD-10-CM

## 2017-06-15 NOTE — Progress Notes (Signed)
   Subjective:    Patient ID: Jesse Wilcox, male    DOB: [birth year], 66 y.o.   MRN: [id number]  HPI this patient presents the office with chief complaint of long thick painful nails.  His nails have not been treated for months.  He says that his nails are painful walking and wearing his shoes.  He says his big toenail on his left foot is extremely painful and sore. Due to the thickness and the fact that his nail stick top high.  He presents the office today for an evaluation and treatment of his feet      Review of Systems  All other systems reviewed and are negative.      Objective:   Physical Exam GENERAL APPEARANCE: Alert, conversant. Appropriately groomed. No acute distress.  VASCULAR: Pedal pulses are  palpable at  [facility] and PT bilateral.  Capillary refill time is immediate to all digits,  Normal temperature gradient.   NEUROLOGIC: sensation is diminished  to 5.07 monofilament at 5/5 sites bilateral.  Light touch is intact bilateral, Muscle strength normal.  MUSCULOSKELETAL: acceptable muscle strength, tone and stability bilateral.  HAV 1st MPJ  B/L NAILS  thick disfigured discolored nails with subungual debris noted 10.  No evidence of any bacterial infection or drainage DERMATOLOGIC: skin color, texture, and turgor are within normal limits.  No preulcerative lesions or ulcers  are seen, no interdigital maceration noted.  No open lesions present.   No drainage noted.         Assessment & Plan:  Onychomycosis  B/L  Diabetes with neuropathy  IE  Debridement of thick mycotic nails.  RTC 3 months   Gardiner Barefoot DPM

## 2017-06-28 MED FILL — metFORMIN HCL 500 MG TABS: 500 | 30 days supply | Qty: 120 | Fill #0

## 2017-06-28 MED FILL — LISINOPRIL-HCTZ 20-12.5 MG: 20-12.5 | 30 days supply | Qty: 30 | Fill #0

## 2017-06-30 ENCOUNTER — Other Ambulatory Visit: Payer: Self-pay | Admitting: Family Medicine

## 2017-06-30 MED FILL — TRUE METRIX TEST STRIP: 30 days supply | Qty: 100 | Fill #0

## 2017-07-06 MED FILL — CLOPIDOGREL 75 MG TABLET: 75 | 30 days supply | Qty: 30 | Fill #1

## 2017-07-06 MED FILL — ATORVASTATIN 20 MG TABLET: 20 | 30 days supply | Qty: 30 | Fill #1

## 2017-07-14 ENCOUNTER — Other Ambulatory Visit: Payer: Self-pay | Admitting: Family Medicine

## 2017-07-14 DIAGNOSIS — I10 Essential (primary) hypertension: Secondary | ICD-10-CM

## 2017-07-14 MED FILL — AMLODIPINE BESYLATE 10 MG T: 10 | 30 days supply | Qty: 30 | Fill #3

## 2017-07-14 MED FILL — LANTUS SOLOSTAR 100 UNITS/M: 100 | 30 days supply | Qty: 15 | Fill #2

## 2017-08-02 MED FILL — metFORMIN HCL 500 MG TABS: 500 | 30 days supply | Qty: 120 | Fill #1

## 2017-08-02 MED FILL — LISINOPRIL-HCTZ 20-12.5 MG: 20-12.5 | 30 days supply | Qty: 30 | Fill #1

## 2017-08-11 MED FILL — ATORVASTATIN 20 MG TABLET: 20 | 30 days supply | Qty: 30 | Fill #2

## 2017-08-17 MED FILL — CLOPIDOGREL 75 MG TABLET: 75 | 30 days supply | Qty: 30 | Fill #2

## 2017-08-20 MED FILL — AMLODIPINE BESYLATE 10 MG T: 10 | 30 days supply | Qty: 30 | Fill #0

## 2017-09-13 MED FILL — LISINOPRIL-HCTZ 20-12.5 MG: 20-12.5 | 30 days supply | Qty: 30 | Fill #2

## 2017-09-13 MED FILL — metFORMIN HCL 500 MG TABS: 500 | 30 days supply | Qty: 120 | Fill #2

## 2017-09-13 MED FILL — traZODone HCL 50 MG TABS: 50 | 30 days supply | Qty: 30 | Fill #0

## 2017-09-17 MED FILL — LANTUS SOLOSTAR 100 UNITS/M: 100 | 30 days supply | Qty: 15 | Fill #3

## 2017-09-27 ENCOUNTER — Other Ambulatory Visit: Payer: Self-pay | Admitting: Family Medicine

## 2017-09-27 DIAGNOSIS — E119 Type 2 diabetes mellitus without complications: Secondary | ICD-10-CM

## 2017-09-27 MED FILL — BD PEN NDL SHORT 31GX5/16: 31G X 8 MM | 30 days supply | Qty: 100 | Fill #0

## 2017-09-27 MED FILL — ATORVASTATIN 20 MG TABLET: 20 | 30 days supply | Qty: 30 | Fill #3

## 2017-09-27 MED FILL — BD PEN NDL SHORT 31GX5/16": 31G X 8 MM | 30 days supply | Qty: 100 | Fill #0

## 2017-10-01 MED FILL — CLOPIDOGREL 75 MG TABLET: 75 | 30 days supply | Qty: 30 | Fill #3

## 2017-10-13 MED FILL — AMLODIPINE BESYLATE 10 MG T: 10 | 30 days supply | Qty: 30 | Fill #1

## 2017-10-20 MED FILL — LANTUS SOLOSTAR 100 UNITS/M: 100 | 30 days supply | Qty: 15 | Fill #4

## 2017-11-03 MED FILL — metFORMIN HCL 500 MG TABS: 500 | 30 days supply | Qty: 120 | Fill #3

## 2017-11-03 MED FILL — LISINOPRIL-HCTZ 20-12.5 MG: 20-12.5 | 30 days supply | Qty: 30 | Fill #3

## 2017-11-09 ENCOUNTER — Encounter: Payer: Self-pay | Admitting: Family Medicine

## 2017-11-09 ENCOUNTER — Ambulatory Visit: Payer: Medicare Other | Attending: Family Medicine | Admitting: Family Medicine

## 2017-11-09 VITALS — BP 128/74 | HR 81 | Temp 97.5°F | Ht 71.0 in | Wt 204.6 lb

## 2017-11-09 DIAGNOSIS — E119 Type 2 diabetes mellitus without complications: Secondary | ICD-10-CM | POA: Diagnosis not present

## 2017-11-09 DIAGNOSIS — Z7902 Long term (current) use of antithrombotics/antiplatelets: Secondary | ICD-10-CM | POA: Diagnosis not present

## 2017-11-09 DIAGNOSIS — B182 Chronic viral hepatitis C: Secondary | ICD-10-CM | POA: Insufficient documentation

## 2017-11-09 DIAGNOSIS — I6529 Occlusion and stenosis of unspecified carotid artery: Secondary | ICD-10-CM | POA: Insufficient documentation

## 2017-11-09 DIAGNOSIS — Z79899 Other long term (current) drug therapy: Secondary | ICD-10-CM | POA: Insufficient documentation

## 2017-11-09 DIAGNOSIS — Z716 Tobacco abuse counseling: Secondary | ICD-10-CM | POA: Diagnosis not present

## 2017-11-09 DIAGNOSIS — G4709 Other insomnia: Secondary | ICD-10-CM

## 2017-11-09 DIAGNOSIS — E7800 Pure hypercholesterolemia, unspecified: Secondary | ICD-10-CM | POA: Diagnosis not present

## 2017-11-09 DIAGNOSIS — Z7982 Long term (current) use of aspirin: Secondary | ICD-10-CM | POA: Diagnosis not present

## 2017-11-09 DIAGNOSIS — I2510 Atherosclerotic heart disease of native coronary artery without angina pectoris: Secondary | ICD-10-CM

## 2017-11-09 DIAGNOSIS — Z794 Long term (current) use of insulin: Secondary | ICD-10-CM | POA: Diagnosis not present

## 2017-11-09 DIAGNOSIS — Z720 Tobacco use: Secondary | ICD-10-CM

## 2017-11-09 DIAGNOSIS — G4700 Insomnia, unspecified: Secondary | ICD-10-CM | POA: Insufficient documentation

## 2017-11-09 DIAGNOSIS — I10 Essential (primary) hypertension: Secondary | ICD-10-CM

## 2017-11-09 DIAGNOSIS — Z9889 Other specified postprocedural states: Secondary | ICD-10-CM | POA: Diagnosis not present

## 2017-11-09 LAB — POCT GLYCOSYLATED HEMOGLOBIN (HGB A1C): Hemoglobin A1C: 8.3

## 2017-11-09 LAB — GLUCOSE, POCT (MANUAL RESULT ENTRY): POC GLUCOSE: 112 mg/dL — AB (ref 70–99)

## 2017-11-09 MED ORDER — INSULIN GLARGINE 100 UNIT/ML SOLOSTAR PEN: 55.0000 [IU] | PEN_INJECTOR | Freq: Every day | SUBCUTANEOUS | 3 refills | Status: DC

## 2017-11-09 MED ORDER — AMLODIPINE BESYLATE 10 MG PO TABS: 10.0000 mg | ORAL_TABLET | Freq: Every day | ORAL | 3 refills | Status: DC

## 2017-11-09 MED ORDER — TRAZODONE HCL 50 MG PO TABS: 50.0000 mg | ORAL_TABLET | Freq: Every evening | ORAL | 3 refills | Status: DC | PRN

## 2017-11-09 MED ORDER — ATORVASTATIN CALCIUM 20 MG PO TABS: 20.0000 mg | ORAL_TABLET | Freq: Every day | ORAL | 3 refills | Status: DC

## 2017-11-09 MED ORDER — LISINOPRIL-HYDROCHLOROTHIAZIDE 20-12.5 MG PO TABS: 1.0000 | ORAL_TABLET | Freq: Every day | ORAL | 3 refills | Status: DC

## 2017-11-09 MED ORDER — CLOPIDOGREL BISULFATE 75 MG PO TABS: 75.0000 mg | ORAL_TABLET | Freq: Every day | ORAL | 3 refills | Status: DC

## 2017-11-09 MED ORDER — METFORMIN HCL 500 MG PO TABS: ORAL_TABLET | ORAL | 3 refills | Status: DC

## 2017-11-09 NOTE — Patient Instructions (Signed)
Diabetes Mellitus and Nutrition When you have diabetes (diabetes mellitus), it is very important to have healthy eating habits because your blood sugar (glucose) levels are greatly affected by what you eat and drink. Eating healthy foods in the appropriate amounts, at about the same times every day, can help you:  Control your blood glucose.  Lower your risk of heart disease.  Improve your blood pressure.  Reach or maintain a healthy weight.  Every person with diabetes is different, and each person has different needs for a meal plan. Your health care provider may recommend that you work with a diet and nutrition specialist (dietitian) to make a meal plan that is best for you. Your meal plan may vary depending on factors such as:  The calories you need.  The medicines you take.  Your weight.  Your blood glucose, blood pressure, and cholesterol levels.  Your activity level.  Other health conditions you have, such as heart or kidney disease.  How do carbohydrates affect me? Carbohydrates affect your blood glucose level more than any other type of food. Eating carbohydrates naturally increases the amount of glucose in your blood. Carbohydrate counting is a method for keeping track of how many carbohydrates you eat. Counting carbohydrates is important to keep your blood glucose at a healthy level, especially if you use insulin or take certain oral diabetes medicines. It is important to know how many carbohydrates you can safely have in each meal. This is different for every person. Your dietitian can help you calculate how many carbohydrates you should have at each meal and for snack. Foods that contain carbohydrates include:  Bread, cereal, rice, pasta, and crackers.  Potatoes and corn.  Peas, beans, and lentils.  Milk and yogurt.  Fruit and juice.  Desserts, such as cakes, cookies, ice cream, and candy.  How does alcohol affect me? Alcohol can cause a sudden decrease in blood  glucose (hypoglycemia), especially if you use insulin or take certain oral diabetes medicines. Hypoglycemia can be a life-threatening condition. Symptoms of hypoglycemia (sleepiness, dizziness, and confusion) are similar to symptoms of having too much alcohol. If your health care provider says that alcohol is safe for you, follow these guidelines:  Limit alcohol intake to no more than 1 drink per day for nonpregnant women and 2 drinks per day for men. One drink equals 12 oz of beer, 5 oz of wine, or 1 oz of hard liquor.  Do not drink on an empty stomach.  Keep yourself hydrated with water, diet soda, or unsweetened iced tea.  Keep in mind that regular soda, juice, and other mixers may contain a lot of sugar and must be counted as carbohydrates.  What are tips for following this plan? Reading food labels  Start by checking the serving size on the label. The amount of calories, carbohydrates, fats, and other nutrients listed on the label are based on one serving of the food. Many foods contain more than one serving per package.  Check the total grams (g) of carbohydrates in one serving. You can calculate the number of servings of carbohydrates in one serving by dividing the total carbohydrates by 15. For example, if a food has 30 g of total carbohydrates, it would be equal to 2 servings of carbohydrates.  Check the number of grams (g) of saturated and trans fats in one serving. Choose foods that have low or no amount of these fats.  Check the number of milligrams (mg) of sodium in one serving. Most people   should limit total sodium intake to less than 2,300 mg per day.  Always check the nutrition information of foods labeled as "low-fat" or "nonfat". These foods may be higher in added sugar or refined carbohydrates and should be avoided.  Talk to your dietitian to identify your daily goals for nutrients listed on the label. Shopping  Avoid buying canned, premade, or processed foods. These  foods tend to be high in fat, sodium, and added sugar.  Shop around the outside edge of the grocery store. This includes fresh fruits and vegetables, bulk grains, fresh meats, and fresh dairy. Cooking  Use low-heat cooking methods, such as baking, instead of high-heat cooking methods like deep frying.  Cook using healthy oils, such as olive, canola, or sunflower oil.  Avoid cooking with butter, cream, or high-fat meats. Meal planning  Eat meals and snacks regularly, preferably at the same times every day. Avoid going long periods of time without eating.  Eat foods high in fiber, such as fresh fruits, vegetables, beans, and whole grains. Talk to your dietitian about how many servings of carbohydrates you can eat at each meal.  Eat 4-6 ounces of lean protein each day, such as lean meat, chicken, fish, eggs, or tofu. 1 ounce is equal to 1 ounce of meat, chicken, or fish, 1 egg, or 1/4 cup of tofu.  Eat some foods each day that contain healthy fats, such as avocado, nuts, seeds, and fish. Lifestyle   Check your blood glucose regularly.  Exercise at least 30 minutes 5 or more days each week, or as told by your health care provider.  Take medicines as told by your health care provider.  Do not use any products that contain nicotine or tobacco, such as cigarettes and e-cigarettes. If you need help quitting, ask your health care provider.  Work with a counselor or diabetes educator to identify strategies to manage stress and any emotional and social challenges. What are some questions to ask my health care provider?  Do I need to meet with a diabetes educator?  Do I need to meet with a dietitian?  What number can I call if I have questions?  When are the best times to check my blood glucose? Where to find more information:  American Diabetes Association: diabetes.org/food-and-fitness/food  Academy of Nutrition and Dietetics:  www.eatright.org/resources/health/diseases-and-conditions/diabetes  National Institute of Diabetes and Digestive and Kidney Diseases (NIH): www.niddk.nih.gov/health-information/diabetes/overview/diet-eating-physical-activity Summary  A healthy meal plan will help you control your blood glucose and maintain a healthy lifestyle.  Working with a diet and nutrition specialist (dietitian) can help you make a meal plan that is best for you.  Keep in mind that carbohydrates and alcohol have immediate effects on your blood glucose levels. It is important to count carbohydrates and to use alcohol carefully. This information is not intended to replace advice given to you by your health care provider. Make sure you discuss any questions you have with your health care provider. Document Released: 07/16/2005 Document Revised: 11/23/2016 Document Reviewed: 11/23/2016 Elsevier Interactive Patient Education  2018 [employer]  

## 2017-11-09 NOTE — Progress Notes (Signed)
Subjective:  Patient ID: Jesse Wilcox, male    DOB: 09-Oct-1951  Age: 67 y.o. MRN: [id number]  CC: Hypertension and Diabetes   HPI Jesse Wilcox  Is a 67 year old male with a history of uncontrolled type 2 diabetes mellitus (A1c 8.3), hypertension, hyperlipidemia, hepatitis C who comes in for a follow-up visit  His A1c is 8.3 and has trended up from 7.4 previously but he endorses compliance with his Lantus but admits to not taking Novolog as he checks his sugars just once a day I have reviewed his blood sugar log and his meter reveals a 30 day average of 121 and 7 day average is 101.  He has been compliant with his antihypertensive and statin and denies myalgias or adverse effects. He denies chest pain or shortness or breath.  Past Medical History:  Diagnosis Date  . Carotid artery occlusion   . Chronic hepatitis C ([place])   . Diabetes mellitus     Past Surgical History:  Procedure Laterality Date  . Aortic arch angiogram  04-15-12   By Dr. Einar Gip  . ARCH AORTOGRAM  04/15/2012   Procedure: ARCH AORTOGRAM;  Surgeon: Laverda Page, MD;  Location: [facility] CATH LAB;  Service: Cardiovascular;;  . CAROTID ANGIOGRAM N/A 04/15/2012   Procedure: CAROTID ANGIOGRAM;  Surgeon: Laverda Page, MD;  Location: [facility] CATH LAB;  Service: Cardiovascular;  Laterality: N/A;  . CAROTID STENT INSERTION N/A 08/23/2012   Procedure: CAROTID STENT INSERTION;  Surgeon: Elam Dutch, MD;  Location: [facility] CATH LAB;  Service: Cardiovascular;  Laterality: N/A;    No Known Allergies   Outpatient Medications Prior to Visit  Medication Sig Dispense Refill  . aspirin EC 81 MG tablet Take 1 tablet (81 mg total) by mouth daily. 30 tablet 3  . B-D ULTRAFINE III SHORT PEN 31G X 8 MM MISC USE AS DIRECTED 4 TIMES DAILY AFTER MEALS AND AT BEDTIME 100 each 11  . Blood Glucose Monitoring Suppl (TRUE METRIX METER) w/Device KIT Use to check blood sugar as directed 1 kit 0  . glucose blood test strip Use as  instructed 100 each 12  . Miconazole POWD Applied to area of irritation around the foreskin twice daily 1 Bottle 2  . Multiple Vitamin (MULTIVITAMIN WITH MINERALS) TABS Take 1 tablet by mouth daily.    . TRUEPLUS LANCETS 28G MISC Use as directed 100 each 12  . amLODipine (NORVASC) 10 MG tablet Take 1 tablet (10 mg total) by mouth daily. 30 tablet 3  . atorvastatin (LIPITOR) 20 MG tablet Take 1 tablet (20 mg total) by mouth daily. 30 tablet 3  . clopidogrel (PLAVIX) 75 MG tablet Take 1 tablet (75 mg total) by mouth daily. 30 tablet 3  . Insulin Glargine (LANTUS) 100 UNIT/ML Solostar Pen Inject 50 Units into the skin daily. 60 pen 3  . lisinopril-hydrochlorothiazide (PRINZIDE,ZESTORETIC) 20-12.5 MG tablet Take 1 tablet by mouth daily. 30 tablet 3  . metFORMIN (GLUCOPHAGE) 500 MG tablet TAKE 2 TABLETS BY MOUTH 2 TIMES DAILY WITH A MEAL 120 tablet 3  . traZODone (DESYREL) 50 MG tablet Take 1 tablet (50 mg total) by mouth at bedtime as needed for sleep. 30 tablet 3  . insulin aspart (NOVOLOG FLEXPEN) 100 UNIT/ML FlexPen Inject 0-12 Units into the skin 3 (three) times daily with meals. As per sliding scale (Patient not taking: Reported on 11/09/2017) 15 mL 3   No facility-administered medications prior to visit.     ROS Review of Systems  Constitutional: Negative for activity change and appetite change.  HENT: Negative for sinus pressure and sore throat.   Eyes: Negative for visual disturbance.  Respiratory: Negative for cough, chest tightness and shortness of breath.   Cardiovascular: Negative for chest pain and leg swelling.  Gastrointestinal: Negative for abdominal distention, abdominal pain, constipation and diarrhea.  Endocrine: Negative.   Genitourinary: Negative for dysuria.  Musculoskeletal: Negative for joint swelling and myalgias.  Skin: Negative for rash.  Allergic/Immunologic: Negative.   Neurological: Negative for weakness, light-headedness and numbness.  Psychiatric/Behavioral:  Negative for dysphoric mood and suicidal ideas.    Objective:  BP 128/74   Pulse 81   Temp (!) 97.5 F (36.4 C) (Oral)   Ht '5\' 11"'$  (1.803 m)   Wt 204 lb 9.6 oz (92.8 kg)   SpO2 96%   BMI 28.54 kg/m   BP/Weight 11/09/2017 06/15/2017 8/82/8003  Systolic BP 491 791 505  Diastolic BP 74 87 79  Wt. (Lbs) 204.6 - 205  BMI 28.54 - 28.59      Physical Exam  Constitutional: He is oriented to person, place, and time. He appears well-developed and well-nourished.  Cardiovascular: Normal rate, normal heart sounds and intact distal pulses.  No murmur heard. Pulmonary/Chest: Effort normal and breath sounds normal. He has no wheezes. He has no rales. He exhibits no tenderness.  Abdominal: Soft. Bowel sounds are normal. He exhibits no distension and no mass. There is no tenderness.  Musculoskeletal: Normal range of motion.  Neurological: He is alert and oriented to person, place, and time.  Skin: Skin is warm and dry.  Psychiatric: He has a normal mood and affect.     Lab Results  Component Value Date   HGBA1C 8.3 11/09/2017     Assessment & Plan:   1. Type 2 diabetes mellitus without complication, without long-term current use of insulin (HCC) Uncontrolled with A1c of 8.3 Increase Lantus to 55 units Encouraged to comply with Novolog Diabetic diet - POCT glucose (manual entry) - POCT glycosylated hemoglobin (Hb A1C) - CMP14+EGFR - Microalbumin/Creatinine Ratio, Urine - Insulin Glargine (LANTUS) 100 UNIT/ML Solostar Pen; Inject 55 Units into the skin daily.  Dispense: 6 pen; Refill: 3 - metFORMIN (GLUCOPHAGE) 500 MG tablet; TAKE 2 TABLETS BY MOUTH 2 TIMES DAILY WITH A MEAL  Dispense: 120 tablet; Refill: 3  2. Essential hypertension Controlled Low sodium diet - amLODipine (NORVASC) 10 MG tablet; Take 1 tablet (10 mg total) by mouth daily.  Dispense: 30 tablet; Refill: 3 - lisinopril-hydrochlorothiazide (PRINZIDE,ZESTORETIC) 20-12.5 MG tablet; Take 1 tablet by mouth daily.   Dispense: 30 tablet; Refill: 3  3. Pure hypercholesterolemia Controlled Low cholesterol diet - Lipid panel - atorvastatin (LIPITOR) 20 MG tablet; Take 1 tablet (20 mg total) by mouth daily.  Dispense: 30 tablet; Refill: 3  4. ASCVD (arteriosclerotic cardiovascular disease) Risk factor modification - clopidogrel (PLAVIX) 75 MG tablet; Take 1 tablet (75 mg total) by mouth daily.  Dispense: 30 tablet; Refill: 3  5. Other insomnia Controlled - traZODone (DESYREL) 50 MG tablet; Take 1 tablet (50 mg total) by mouth at bedtime as needed for sleep.  Dispense: 30 tablet; Refill: 3  6. Tobacco abuse Spent 4 minutes counseling on Tobacco use and hazardous effects and he is not ready to quit.   Meds ordered this encounter  Medications  . amLODipine (NORVASC) 10 MG tablet    Sig: Take 1 tablet (10 mg total) by mouth daily.    Dispense:  30 tablet    Refill:  3  . atorvastatin (LIPITOR) 20 MG tablet    Sig: Take 1 tablet (20 mg total) by mouth daily.    Dispense:  30 tablet    Refill:  3  . clopidogrel (PLAVIX) 75 MG tablet    Sig: Take 1 tablet (75 mg total) by mouth daily.    Dispense:  30 tablet    Refill:  3  . Insulin Glargine (LANTUS) 100 UNIT/ML Solostar Pen    Sig: Inject 55 Units into the skin daily.    Dispense:  6 pen    Refill:  3  . lisinopril-hydrochlorothiazide (PRINZIDE,ZESTORETIC) 20-12.5 MG tablet    Sig: Take 1 tablet by mouth daily.    Dispense:  30 tablet    Refill:  3  . metFORMIN (GLUCOPHAGE) 500 MG tablet    Sig: TAKE 2 TABLETS BY MOUTH 2 TIMES DAILY WITH A MEAL    Dispense:  120 tablet    Refill:  3  . traZODone (DESYREL) 50 MG tablet    Sig: Take 1 tablet (50 mg total) by mouth at bedtime as needed for sleep.    Dispense:  30 tablet    Refill:  3    Follow-up: Return in about 3 months (around 02/07/2018) for follow up on diabetes and hypertension.   Arnoldo Morale MD

## 2017-11-10 ENCOUNTER — Telehealth: Payer: Self-pay

## 2017-11-10 ENCOUNTER — Encounter: Payer: Self-pay | Admitting: Family Medicine

## 2017-11-10 LAB — CMP14+EGFR
ALBUMIN: 4.3 g/dL (ref 3.6–4.8)
ALT: 41 IU/L (ref 0–44)
AST: 20 IU/L (ref 0–40)
Albumin/Globulin Ratio: 1.3 (ref 1.2–2.2)
Alkaline Phosphatase: 106 IU/L (ref 39–117)
BUN / CREAT RATIO: 12 (ref 10–24)
BUN: 10 mg/dL (ref 8–27)
Bilirubin Total: 0.9 mg/dL (ref 0.0–1.2)
CALCIUM: 9.9 mg/dL (ref 8.6–10.2)
CO2: 26 mmol/L (ref 20–29)
CREATININE: 0.84 mg/dL (ref 0.76–1.27)
Chloride: 102 mmol/L (ref 96–106)
GFR, EST AFRICAN AMERICAN: 106 mL/min/{1.73_m2} (ref >59)
GFR, EST NON AFRICAN AMERICAN: 92 mL/min/{1.73_m2} (ref >59)
GLOBULIN, TOTAL: 3.2 g/dL (ref 1.5–4.5)
Glucose: 103 mg/dL — ABNORMAL HIGH (ref 65–99)
Potassium: 4.5 mmol/L (ref 3.5–5.2)
SODIUM: 143 mmol/L (ref 134–144)
Total Protein: 7.5 g/dL (ref 6.0–8.5)

## 2017-11-10 LAB — LIPID PANEL
Chol/HDL Ratio: 4.6 ratio (ref 0.0–5.0)
Cholesterol, Total: 151 mg/dL (ref 100–199)
HDL: 33 mg/dL — ABNORMAL LOW (ref >39)
LDL CALC: 84 mg/dL (ref 0–99)
Triglycerides: 172 mg/dL — ABNORMAL HIGH (ref 0–149)
VLDL Cholesterol Cal: 34 mg/dL (ref 5–40)

## 2017-11-10 NOTE — Telephone Encounter (Signed)
Pt was called and informed of lab results. 

## 2017-11-18 MED FILL — ATORVASTATIN 20 MG TABLET: 20 | 30 days supply | Qty: 30 | Fill #0

## 2017-11-18 MED FILL — CLOPIDOGREL 75 MG TABLET: 75 | 30 days supply | Qty: 30 | Fill #0

## 2017-11-29 MED FILL — AMLODIPINE BESYLATE 10 MG T: 10 | 30 days supply | Qty: 30 | Fill #0

## 2017-12-15 MED FILL — LANTUS SOLOSTAR 100 UNITS/M: 100 | 30 days supply | Qty: 18 | Fill #0

## 2017-12-21 MED FILL — TRUE METRIX TEST STRIP: 30 days supply | Qty: 100 | Fill #1

## 2017-12-24 MED FILL — LISINOPRIL-HCTZ 20-12.5 MG: 20-12.5 | 30 days supply | Qty: 30 | Fill #0

## 2017-12-24 MED FILL — ATORVASTATIN 20 MG TABLET: 20 | 30 days supply | Qty: 30 | Fill #1

## 2017-12-27 MED FILL — metFORMIN HCL 500 MG TABS: 500 | 30 days supply | Qty: 120 | Fill #0

## 2018-01-03 MED FILL — CLOPIDOGREL 75 MG TABLET: 75 | 30 days supply | Qty: 30 | Fill #1

## 2018-01-03 MED FILL — AMLODIPINE BESYLATE 10 MG T: 10 | 30 days supply | Qty: 30 | Fill #1

## 2018-01-13 ENCOUNTER — Other Ambulatory Visit: Payer: Self-pay

## 2018-01-13 ENCOUNTER — Encounter (HOSPITAL_COMMUNITY): Payer: Self-pay | Admitting: Emergency Medicine

## 2018-01-13 ENCOUNTER — Emergency Department (HOSPITAL_COMMUNITY)
Admission: EM | Admit: 2018-01-13 | Discharge: 2018-01-13 | Disposition: A | Discharge status: Home / Self Care | Payer: Medicare Other | Attending: Emergency Medicine | Admitting: Emergency Medicine

## 2018-01-13 DIAGNOSIS — X58XXXA Exposure to other specified factors, initial encounter: Secondary | ICD-10-CM | POA: Insufficient documentation

## 2018-01-13 DIAGNOSIS — Z794 Long term (current) use of insulin: Secondary | ICD-10-CM | POA: Diagnosis not present

## 2018-01-13 DIAGNOSIS — Z79899 Other long term (current) drug therapy: Secondary | ICD-10-CM | POA: Diagnosis not present

## 2018-01-13 DIAGNOSIS — F17210 Nicotine dependence, cigarettes, uncomplicated: Secondary | ICD-10-CM | POA: Insufficient documentation

## 2018-01-13 DIAGNOSIS — Y939 Activity, unspecified: Secondary | ICD-10-CM | POA: Insufficient documentation

## 2018-01-13 DIAGNOSIS — S339XXA Sprain of unspecified parts of lumbar spine and pelvis, initial encounter: Secondary | ICD-10-CM | POA: Insufficient documentation

## 2018-01-13 DIAGNOSIS — Y929 Unspecified place or not applicable: Secondary | ICD-10-CM | POA: Diagnosis not present

## 2018-01-13 DIAGNOSIS — Y999 Unspecified external cause status: Secondary | ICD-10-CM | POA: Insufficient documentation

## 2018-01-13 DIAGNOSIS — E119 Type 2 diabetes mellitus without complications: Secondary | ICD-10-CM | POA: Diagnosis not present

## 2018-01-13 DIAGNOSIS — I10 Essential (primary) hypertension: Secondary | ICD-10-CM | POA: Insufficient documentation

## 2018-01-13 DIAGNOSIS — S39012A Strain of muscle, fascia and tendon of lower back, initial encounter: Secondary | ICD-10-CM

## 2018-01-13 DIAGNOSIS — Z7902 Long term (current) use of antithrombotics/antiplatelets: Secondary | ICD-10-CM | POA: Diagnosis not present

## 2018-01-13 DIAGNOSIS — Z7982 Long term (current) use of aspirin: Secondary | ICD-10-CM | POA: Diagnosis not present

## 2018-01-13 DIAGNOSIS — S3992XA Unspecified injury of lower back, initial encounter: Secondary | ICD-10-CM | POA: Diagnosis present

## 2018-01-13 MED ORDER — DOCUSATE SODIUM 100 MG PO CAPS: 100.0000 mg | ORAL_CAPSULE | Freq: Two times a day (BID) | ORAL | 0 refills | Status: DC

## 2018-01-13 MED ORDER — HYDROCODONE-ACETAMINOPHEN 5-325 MG PO TABS: 1.0000 | ORAL_TABLET | Freq: Four times a day (QID) | ORAL | 0 refills | Status: DC | PRN

## 2018-01-13 MED ORDER — BACLOFEN 10 MG PO TABS: 10.0000 mg | ORAL_TABLET | Freq: Three times a day (TID) | ORAL | 0 refills | Status: DC | PRN

## 2018-01-13 MED FILL — BACLOFEN 10 MG TABLET: 10 | 10 days supply | Qty: 30 | Fill #0

## 2018-01-13 NOTE — ED Triage Notes (Signed)
Pt reports L mid back pain X few days, states pain is worse with movement. Denies GU S/S

## 2018-01-13 NOTE — Discharge Instructions (Signed)
SEEK IMMEDIATE MEDICAL ATTENTION IF: New numbness, tingling, weakness, or problem with the use of your arms or legs.  Severe back pain not relieved with medications.  Change in bowel or bladder control.  Increasing pain in any areas of the body (such as chest or abdominal pain).  Shortness of breath, dizziness or fainting.  Nausea (feeling sick to your stomach), vomiting, fever, or sweats.  

## 2018-01-13 NOTE — ED Provider Notes (Signed)
[facility] [place] EMERGENCY DEPARTMENT Provider Note   CSN: [id number] Arrival date & time: 01/13/18  0540     History   Chief Complaint Chief Complaint  Patient presents with  . Back Pain    HPI Jesse Wilcox is a 67 y.o. male who presents the emergency department with chief complaint of left low back pain.  Patient is an [employer] and works in Water engineer at [employer].  Patient states that he woke up normally yesterday but throughout the day began having pain in his left lower back which is worse with movement, twisting, changing from sitting to standing, bending over and standing up straight.  He states he feels like he has a pulled muscle.  He denies any ripping or tearing abdominal pain, numbness or tingling in his legs, loss of bowel or bladder continence, weakness.  He denies urinary symptoms, fevers, chills.  HPI  Past Medical History:  Diagnosis Date  . Carotid artery occlusion   . Chronic hepatitis C ([place])   . Diabetes mellitus     Patient Active Problem List   Diagnosis Date Noted  . Chronic hepatitis C ([place]) 10/29/2015  . Hyperlipidemia 10/29/2015  . Tobacco abuse 10/29/2015  . Balanitis 05/16/2015  . Type 2 diabetes mellitus without complication ([place]) 40/98/1191  . Spondylolisthesis at L4-L5 level 11/14/2014  . Essential hypertension, benign 07/17/2014  . Hep C w/o coma, chronic ([place]) 10/22/2012  . SOB (shortness of breath) 08/03/2012  . Occlusion and stenosis of carotid artery without mention of cerebral infarction 04/15/2012  . DM type 2 (diabetes mellitus, type 2) ([place]) 02/10/2012    Past Surgical History:  Procedure Laterality Date  . Aortic arch angiogram  04-15-12   By Dr. Einar Gip  . ARCH AORTOGRAM  04/15/2012   Procedure: ARCH AORTOGRAM;  Surgeon: Laverda Page, MD;  Location: [facility] CATH LAB;  Service: Cardiovascular;;  . CAROTID ANGIOGRAM N/A 04/15/2012   Procedure: CAROTID ANGIOGRAM;  Surgeon:  Laverda Page, MD;  Location: [facility] CATH LAB;  Service: Cardiovascular;  Laterality: N/A;  . CAROTID STENT INSERTION N/A 08/23/2012   Procedure: CAROTID STENT INSERTION;  Surgeon: Elam Dutch, MD;  Location: [facility] CATH LAB;  Service: Cardiovascular;  Laterality: N/A;       Home Medications    Prior to Admission medications   Medication Sig Start Date End Date Taking? Authorizing Provider  amLODipine (NORVASC) 10 MG tablet Take 1 tablet (10 mg total) by mouth daily. 11/09/17   Charlott Rakes, MD  aspirin EC 81 MG tablet Take 1 tablet (81 mg total) by mouth daily. 08/17/16   Charlott Rakes, MD  atorvastatin (LIPITOR) 20 MG tablet Take 1 tablet (20 mg total) by mouth daily. 11/09/17   Charlott Rakes, MD  B-D ULTRAFINE III SHORT PEN 31G X 8 MM MISC USE AS DIRECTED 4 TIMES DAILY AFTER MEALS AND AT BEDTIME 09/27/17   Charlott Rakes, MD  baclofen (LIORESAL) 10 MG tablet Take 1 tablet (10 mg total) by mouth 3 (three) times daily as needed for muscle spasms. 01/13/18   Margarita Mail, PA-C  Blood Glucose Monitoring Suppl (TRUE METRIX METER) w/Device KIT Use to check blood sugar as directed 12/09/15   Charlott Rakes, MD  clopidogrel (PLAVIX) 75 MG tablet Take 1 tablet (75 mg total) by mouth daily. 11/09/17   Charlott Rakes, MD  docusate sodium (COLACE) 100 MG capsule Take 1 capsule (100 mg total) by mouth every 12 (twelve) hours. 01/13/18   Margarita Mail,  PA-C  glucose blood test strip Use as instructed 06/30/17   Charlott Rakes, MD  HYDROcodone-acetaminophen (NORCO) 5-325 MG tablet Take 1-2 tablets by mouth every 6 (six) hours as needed for severe pain. 01/13/18   Brenda Samano, PA-C  insulin aspart (NOVOLOG FLEXPEN) 100 UNIT/ML FlexPen Inject 0-12 Units into the skin 3 (three) times daily with meals. As per sliding scale Patient not taking: Reported on 11/09/2017 05/21/17   Charlott Rakes, MD  Insulin Glargine (LANTUS) 100 UNIT/ML Solostar Pen Inject 55 Units into the skin daily. 11/09/17   Charlott Rakes, MD  lisinopril-hydrochlorothiazide (PRINZIDE,ZESTORETIC) 20-12.5 MG tablet Take 1 tablet by mouth daily. 11/09/17   Charlott Rakes, MD  metFORMIN (GLUCOPHAGE) 500 MG tablet TAKE 2 TABLETS BY MOUTH 2 TIMES DAILY WITH A MEAL 11/09/17   Charlott Rakes, MD  Miconazole POWD Applied to area of irritation around the foreskin twice daily 05/25/15   Darlyne Russian, MD  Multiple Vitamin (MULTIVITAMIN WITH MINERALS) TABS Take 1 tablet by mouth daily.    [provider]  traZODone (DESYREL) 50 MG tablet Take 1 tablet (50 mg total) by mouth at bedtime as needed for sleep. 11/09/17   Charlott Rakes, MD  TRUEPLUS LANCETS 28G MISC Use as directed 08/17/16   Charlott Rakes, MD    Family History Family History  Problem Relation Age of Onset  . Diabetes Mother   . Heart disease Mother   . Hypertension Mother   . Heart attack Mother   . Diabetes Father   . Heart disease Father   . Hypertension Father   . Diabetes Sister   . Hyperlipidemia Sister   . Heart disease Sister   . Heart attack Sister   . Peripheral vascular disease Sister   . Hyperlipidemia Brother   . Hypertension Brother     Social History Social History   Tobacco Use  . Smoking status: Current Every Day Smoker    Packs/day: 1.00    Years: 47.00    Pack years: 47.00    Types: Cigarettes  . Smokeless tobacco: Never Used  Substance Use Topics  . 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 Drug use: No     Allergies   Patient has no known allergies.   Review of Systems Review of Systems Ten systems reviewed and are negative for acute change, except as noted in the HPI.    Physical Exam Updated Vital Signs BP 137/68 (BP Location: Right Arm)   Pulse 70   Temp (!) 97.4 F (36.3 C) (Oral)   Resp 20   Ht _0  (1.803 m)   Wt 95.3 kg (210 lb)   SpO2 99%   BMI 29.29 kg/m   Physical Exam  Constitutional: He appears well-developed and well-nourished. No distress.  HENT:    Head: Normocephalic and atraumatic.  Eyes: Conjunctivae are normal. No scleral icterus.  Neck: Normal range of motion. Neck supple.  Cardiovascular: Normal rate, regular rhythm and normal heart sounds.  Pulmonary/Chest: Effort normal and breath sounds normal. No respiratory distress.  Abdominal: Soft. There is no tenderness.  Musculoskeletal: He exhibits no edema.  Patient appears to be in mild to moderate pain, antalgic gait noted. Lumbosacral spine area reveals no local tenderness or mass.  TTP Left QL muscle.Painful and reduced LS ROM noted. Straight leg raise is negative. DTR's, motor strength and sensation normal, including heel and toe gait.  Peripheral pulses are palpable. NO CVA tenderness  or suprapubic pain   Neurological: He is alert.  Skin: Skin is warm and dry. He is not diaphoretic.  Psychiatric: His behavior is normal.  Nursing note and vitals reviewed.    ED Treatments / Results  Labs (all labs ordered are listed, but only abnormal results are displayed) Labs Reviewed - No data to display  EKG  EKG Interpretation None       Radiology No results found.  Procedures Procedures (including critical care time)  Medications Ordered in ED Medications - No data to display   Initial Impression / Assessment and Plan / ED Course  I have reviewed the triage vital signs and the nursing notes.  Pertinent labs & imaging results that were available during my care of the patient were reviewed by me and considered in my medical decision making (see chart for details).     Patient with back pain.  No evidence of urinary tract infection, suspicion for abdominal aortic aneurysm or dissection.  No neurological deficits and normal neuro exam.  Patient can walk but states is painful.  No loss of bowel or bladder control.  No concern for cauda equina.  No fever, night sweats, weight loss, h/o cancer, IVDU.  RICE protocol and pain medicine indicated and discussed with patient.     Final Clinical Impressions(s) / ED Diagnoses   Final diagnoses:  Strain of lumbar region, initial encounter    ED Discharge Orders        Ordered    HYDROcodone-acetaminophen (NORCO) 5-325 MG tablet  Every 6 hours PRN     01/13/18 1005    baclofen (LIORESAL) 10 MG tablet  3 times daily PRN     01/13/18 1005    docusate sodium (COLACE) 100 MG capsule  Every 12 hours     01/13/18 1005       Margarita Mail, PA-C 01/13/18 1010    Virgel Manifold, MD 01/19/18 1526

## 2018-02-10 MED FILL — LANTUS SOLOSTAR 100 UNITS/M: 100 | 30 days supply | Qty: 18 | Fill #1

## 2018-02-10 MED FILL — CLOPIDOGREL 75 MG TABLET: 75 | 30 days supply | Qty: 30 | Fill #2

## 2018-02-10 MED FILL — metFORMIN HCL 500 MG TABS: 500 | 30 days supply | Qty: 120 | Fill #1

## 2018-02-10 MED FILL — ATORVASTATIN 20 MG TABLET: 20 | 30 days supply | Qty: 30 | Fill #2

## 2018-02-23 MED FILL — TRUEplus 5-BEVEL PEN NEEDLE: 31G X 8 MM | 30 days supply | Qty: 100 | Fill #1

## 2018-02-24 MED FILL — AMLODIPINE BESYLATE 10 MG T: 10 | 30 days supply | Qty: 30 | Fill #2

## 2018-03-11 ENCOUNTER — Encounter: Payer: Self-pay | Admitting: Family Medicine

## 2018-03-11 ENCOUNTER — Ambulatory Visit: Payer: Medicare Other | Attending: Family Medicine | Admitting: Family Medicine

## 2018-03-11 VITALS — BP 116/70 | HR 84 | Temp 97.5°F | Ht 71.0 in | Wt 205.2 lb

## 2018-03-11 DIAGNOSIS — G4700 Insomnia, unspecified: Secondary | ICD-10-CM | POA: Insufficient documentation

## 2018-03-11 DIAGNOSIS — E119 Type 2 diabetes mellitus without complications: Secondary | ICD-10-CM | POA: Diagnosis present

## 2018-03-11 DIAGNOSIS — I10 Essential (primary) hypertension: Secondary | ICD-10-CM | POA: Diagnosis not present

## 2018-03-11 DIAGNOSIS — I2510 Atherosclerotic heart disease of native coronary artery without angina pectoris: Secondary | ICD-10-CM

## 2018-03-11 DIAGNOSIS — Z8619 Personal history of other infectious and parasitic diseases: Secondary | ICD-10-CM | POA: Insufficient documentation

## 2018-03-11 DIAGNOSIS — Z79899 Other long term (current) drug therapy: Secondary | ICD-10-CM | POA: Insufficient documentation

## 2018-03-11 DIAGNOSIS — Z1211 Encounter for screening for malignant neoplasm of colon: Secondary | ICD-10-CM

## 2018-03-11 DIAGNOSIS — E7800 Pure hypercholesterolemia, unspecified: Secondary | ICD-10-CM | POA: Insufficient documentation

## 2018-03-11 DIAGNOSIS — Z23 Encounter for immunization: Secondary | ICD-10-CM | POA: Insufficient documentation

## 2018-03-11 DIAGNOSIS — Z7902 Long term (current) use of antithrombotics/antiplatelets: Secondary | ICD-10-CM | POA: Diagnosis not present

## 2018-03-11 DIAGNOSIS — Z794 Long term (current) use of insulin: Secondary | ICD-10-CM | POA: Diagnosis not present

## 2018-03-11 DIAGNOSIS — G4709 Other insomnia: Secondary | ICD-10-CM | POA: Diagnosis not present

## 2018-03-11 DIAGNOSIS — Z7982 Long term (current) use of aspirin: Secondary | ICD-10-CM | POA: Diagnosis not present

## 2018-03-11 DIAGNOSIS — E1100 Type 2 diabetes mellitus with hyperosmolarity without nonketotic hyperglycemic-hyperosmolar coma (NKHHC): Secondary | ICD-10-CM | POA: Diagnosis not present

## 2018-03-11 LAB — GLUCOSE, POCT (MANUAL RESULT ENTRY): POC Glucose: 125 mg/dl — AB (ref 70–99)

## 2018-03-11 LAB — POCT GLYCOSYLATED HEMOGLOBIN (HGB A1C): HEMOGLOBIN A1C: 8.5

## 2018-03-11 MED ORDER — AMLODIPINE BESYLATE 10 MG PO TABS: 10.0000 mg | ORAL_TABLET | Freq: Every day | ORAL | 3 refills | Status: DC

## 2018-03-11 MED ORDER — TRAZODONE HCL 50 MG PO TABS: 50.0000 mg | ORAL_TABLET | Freq: Every evening | ORAL | 3 refills | Status: DC | PRN

## 2018-03-11 MED ORDER — PNEUMOCOCCAL 13-VAL CONJ VACC IM SUSP: 0.5000 mL | INTRAMUSCULAR | 0 refills | Status: AC

## 2018-03-11 MED ORDER — LISINOPRIL-HYDROCHLOROTHIAZIDE 20-12.5 MG PO TABS: 1.0000 | ORAL_TABLET | Freq: Every day | ORAL | 3 refills | Status: DC

## 2018-03-11 MED ORDER — ATORVASTATIN CALCIUM 20 MG PO TABS: 20.0000 mg | ORAL_TABLET | Freq: Every day | ORAL | 3 refills | Status: DC

## 2018-03-11 MED ORDER — CLOPIDOGREL BISULFATE 75 MG PO TABS: 75.0000 mg | ORAL_TABLET | Freq: Every day | ORAL | 3 refills | Status: DC

## 2018-03-11 MED ORDER — METFORMIN HCL 500 MG PO TABS: ORAL_TABLET | ORAL | 3 refills | Status: DC

## 2018-03-11 MED FILL — LISINOPRIL-HCTZ 20-12.5 MG: 20-12.5 | 30 days supply | Qty: 30 | Fill #0

## 2018-03-11 MED FILL — PREVNAR 13 SYRINGE: 1 days supply | Qty: 1 | Fill #0

## 2018-03-11 NOTE — Progress Notes (Signed)
Subjective:  Patient ID: Jesse Wilcox, male    DOB: 1951-04-18  Age: 67 y.o. MRN: [id number]  CC: Diabetes   HPI Jesse Wilcox is a 67 year old male with a history of uncontrolled type 2 diabetes mellitus (A1c 8.5), hypertension, hyperlipidemia, hepatitis C who comes in for a follow-up visit He has his glucometer with him which reveals a 7-day average of 110, 30-day average of 105 and his blood sugars range from 98-130 fasting and he informs me he does not have to use his NovoLog due to his blood sugars being good but has been compliant with his Lantus and a diabetic diet He complains of pain in his feet from prolonged standing on days when he has to work and has started using diabetic socks denies burning on numbness. He is up-to-date on annual eye exam. Doing well on his antihypertensive and his statin and denies myalgias or adverse effects. He is requesting a colonoscopy referral for review of his chart indicates I had referred him in 01/2017 and the [facility] had contacted him regarding obtaining previous records from his previous colonoscopy which was unsuccessful.  Past Medical History:  Diagnosis Date  . Carotid artery occlusion   . Chronic hepatitis C ([place])   . Diabetes mellitus     Past Surgical History:  Procedure Laterality Date  . Aortic arch angiogram  04-15-12   By Dr. Einar Gip  . ARCH AORTOGRAM  04/15/2012   Procedure: ARCH AORTOGRAM;  Surgeon: Laverda Page, MD;  Location: [facility] CATH LAB;  Service: Cardiovascular;;  . CAROTID ANGIOGRAM N/A 04/15/2012   Procedure: CAROTID ANGIOGRAM;  Surgeon: Laverda Page, MD;  Location: [facility] CATH LAB;  Service: Cardiovascular;  Laterality: N/A;  . CAROTID STENT INSERTION N/A 08/23/2012   Procedure: CAROTID STENT INSERTION;  Surgeon: Elam Dutch, MD;  Location: [facility] CATH LAB;  Service: Cardiovascular;  Laterality: N/A;    No Known Allergies   Outpatient Medications Prior to Visit  Medication Sig Dispense Refill  .  aspirin EC 81 MG tablet Take 1 tablet (81 mg total) by mouth daily. 30 tablet 3  . B-D ULTRAFINE III SHORT PEN 31G X 8 MM MISC USE AS DIRECTED 4 TIMES DAILY AFTER MEALS AND AT BEDTIME 100 each 11  . baclofen (LIORESAL) 10 MG tablet Take 1 tablet (10 mg total) by mouth 3 (three) times daily as needed for muscle spasms. 30 each 0  . Blood Glucose Monitoring Suppl (TRUE METRIX METER) w/Device KIT Use to check blood sugar as directed 1 kit 0  . glucose blood test strip Use as instructed 100 each 12  . HYDROcodone-acetaminophen (NORCO) 5-325 MG tablet Take 1-2 tablets by mouth every 6 (six) hours as needed for severe pain. 6 tablet 0  . Insulin Glargine (LANTUS) 100 UNIT/ML Solostar Pen Inject 55 Units into the skin daily. 6 pen 3  . Miconazole POWD Applied to area of irritation around the foreskin twice daily 1 Bottle 2  . Multiple Vitamin (MULTIVITAMIN WITH MINERALS) TABS Take 1 tablet by mouth daily.    . TRUEPLUS LANCETS 28G MISC Use as directed 100 each 12  . amLODipine (NORVASC) 10 MG tablet Take 1 tablet (10 mg total) by mouth daily. 30 tablet 3  . atorvastatin (LIPITOR) 20 MG tablet Take 1 tablet (20 mg total) by mouth daily. 30 tablet 3  . clopidogrel (PLAVIX) 75 MG tablet Take 1 tablet (75 mg total) by mouth daily. 30 tablet 3  . lisinopril-hydrochlorothiazide (PRINZIDE,ZESTORETIC) 20-12.5  MG tablet Take 1 tablet by mouth daily. 30 tablet 3  . metFORMIN (GLUCOPHAGE) 500 MG tablet TAKE 2 TABLETS BY MOUTH 2 TIMES DAILY WITH A MEAL 120 tablet 3  . traZODone (DESYREL) 50 MG tablet Take 1 tablet (50 mg total) by mouth at bedtime as needed for sleep. 30 tablet 3  . docusate sodium (COLACE) 100 MG capsule Take 1 capsule (100 mg total) by mouth every 12 (twelve) hours. (Patient not taking: Reported on 03/11/2018) 20 capsule 0  . insulin aspart (NOVOLOG FLEXPEN) 100 UNIT/ML FlexPen Inject 0-12 Units into the skin 3 (three) times daily with meals. As per sliding scale (Patient not taking: Reported on  11/09/2017) 15 mL 3   No facility-administered medications prior to visit.     ROS Review of Systems  Objective:  BP 116/70   Pulse 84   Temp (!) 97.5 F (36.4 C) (Oral)   Ht '5\' 11"'$  (1.803 m)   Wt 205 lb 3.2 oz (93.1 kg)   SpO2 94%   BMI 28.62 kg/m   BP/Weight 03/11/2018 02/26/8340 07/08/2228  Systolic BP 798 921 194  Diastolic BP 70 80 74  Wt. (Lbs) 205.2 210 204.6  BMI 28.62 29.29 28.54  Some encounter information is confidential and restricted. Go to Review Flowsheets activity to see all data.      Physical Exam  Constitutional: He is oriented to person, place, and time. He appears well-developed and well-nourished.  Cardiovascular: Normal rate, normal heart sounds and intact distal pulses.  No murmur heard. Pulmonary/Chest: Effort normal and breath sounds normal. He has no wheezes. He has no rales. He exhibits no tenderness.  Abdominal: Soft. Bowel sounds are normal. He exhibits no distension and no mass. There is no tenderness.  Musculoskeletal: Normal range of motion.  Neurological: He is alert and oriented to person, place, and time.  Skin: Skin is warm and dry.  Psychiatric: He has a normal mood and affect.     CMP Latest Ref Rng & Units 11/09/2017 05/21/2017 02/17/2017  Glucose 65 - 99 mg/dL 103(H) 132(H) 122(H)  BUN 8 - 27 mg/dL '10 10 11  '$ Creatinine 0.76 - 1.27 mg/dL 0.84 0.93 0.87  Sodium 134 - 144 mmol/L 143 139 141  Potassium 3.5 - 5.2 mmol/L 4.5 4.4 4.4  Chloride 96 - 106 mmol/L 102 101 99  CO2 20 - 29 mmol/L '26 24 25  '$ Calcium 8.6 - 10.2 mg/dL 9.9 9.6 9.4  Total Protein 6.0 - 8.5 g/dL 7.5 7.6 7.8  Total Bilirubin 0.0 - 1.2 mg/dL 0.9 0.5 0.9  Alkaline Phos 39 - 117 IU/L 106 102 101  AST 0 - 40 IU/L 20 40 23  ALT 0 - 44 IU/L 41 58(H) 37    Lipid Panel     Component Value Date/Time   CHOL 151 11/09/2017 1113   TRIG 172 (H) 11/09/2017 1113   HDL 33 (L) 11/09/2017 1113   CHOLHDL 4.6 11/09/2017 1113   CHOLHDL 4.1 04/24/2016 1033   VLDL 38 (H) 04/24/2016  1033   LDLCALC 84 11/09/2017 1113   LDLDIRECT 140.6 08/08/2014 1024     Lab Results  Component Value Date   HGBA1C 8.5 03/11/2018    Assessment & Plan:   1. Type 2 diabetes mellitus with hyperosmolarity without coma, with long-term current use of insulin (HCC) Uncontrolled with A1c of 8.5 but review of glucometer revealed fasting sugars in the 98-130 range No regimen change today but advised to be adherent with NovoLog sliding scale which he  has not been doing Diabetic diet - POCT glucose (manual entry) - POCT glycosylated hemoglobin (Hb A1C) - Ambulatory referral to Podiatry - metFORMIN (GLUCOPHAGE) 500 MG tablet; TAKE 2 TABLETS BY MOUTH 2 TIMES DAILY WITH A MEAL  Dispense: 120 tablet; Refill: 3 - CMP14+EGFR - Lipid panel  2. Essential hypertension Controlled Low-sodium diet - amLODipine (NORVASC) 10 MG tablet; Take 1 tablet (10 mg total) by mouth daily.  Dispense: 30 tablet; Refill: 3 - lisinopril-hydrochlorothiazide (PRINZIDE,ZESTORETIC) 20-12.5 MG tablet; Take 1 tablet by mouth daily.  Dispense: 30 tablet; Refill: 3  3. Pure hypercholesterolemia Controlled Low-cholesterol diet - atorvastatin (LIPITOR) 20 MG tablet; Take 1 tablet (20 mg total) by mouth daily.  Dispense: 30 tablet; Refill: 3  4. ASCVD (arteriosclerotic cardiovascular disease) Stable - clopidogrel (PLAVIX) 75 MG tablet; Take 1 tablet (75 mg total) by mouth daily.  Dispense: 30 tablet; Refill: 3  5. Other insomnia stable - traZODone (DESYREL) 50 MG tablet; Take 1 tablet (50 mg total) by mouth at bedtime as needed for sleep.  Dispense: 30 tablet; Refill: 3  6. Screening for colon cancer - Ambulatory referral to Gastroenterology  7. Need for vaccination against Streptococcus pneumoniae Prevnar 13 administered   Meds ordered this encounter  Medications  . amLODipine (NORVASC) 10 MG tablet    Sig: Take 1 tablet (10 mg total) by mouth daily.    Dispense:  30 tablet    Refill:  3  . atorvastatin  (LIPITOR) 20 MG tablet    Sig: Take 1 tablet (20 mg total) by mouth daily.    Dispense:  30 tablet    Refill:  3  . clopidogrel (PLAVIX) 75 MG tablet    Sig: Take 1 tablet (75 mg total) by mouth daily.    Dispense:  30 tablet    Refill:  3  . lisinopril-hydrochlorothiazide (PRINZIDE,ZESTORETIC) 20-12.5 MG tablet    Sig: Take 1 tablet by mouth daily.    Dispense:  30 tablet    Refill:  3  . metFORMIN (GLUCOPHAGE) 500 MG tablet    Sig: TAKE 2 TABLETS BY MOUTH 2 TIMES DAILY WITH A MEAL    Dispense:  120 tablet    Refill:  3  . traZODone (DESYREL) 50 MG tablet    Sig: Take 1 tablet (50 mg total) by mouth at bedtime as needed for sleep.    Dispense:  30 tablet    Refill:  3  . pneumococcal 13-valent conjugate vaccine (PREVNAR 13) SUSP injection    Sig: Inject 0.5 mLs into the muscle tomorrow at 10 am for 1 dose.    Dispense:  0.5 mL    Refill:  0    Follow-up: Return in about 3 months (around 06/11/2018) for Follow-up of chronic medical conditions.   Charlott Rakes MD

## 2018-03-11 NOTE — Progress Notes (Signed)
Referral to gastro for colonoscopy.

## 2018-03-11 NOTE — Patient Instructions (Signed)

## 2018-03-12 LAB — CMP14+EGFR
ALBUMIN: 4.4 g/dL (ref 3.6–4.8)
ALT: 41 IU/L (ref 0–44)
AST: 24 IU/L (ref 0–40)
Albumin/Globulin Ratio: 1.6 (ref 1.2–2.2)
Alkaline Phosphatase: 96 IU/L (ref 39–117)
BUN / CREAT RATIO: 12 (ref 10–24)
BUN: 10 mg/dL (ref 8–27)
Bilirubin Total: 0.7 mg/dL (ref 0.0–1.2)
CALCIUM: 9.4 mg/dL (ref 8.6–10.2)
CO2: 25 mmol/L (ref 20–29)
Chloride: 101 mmol/L (ref 96–106)
Creatinine, Ser: 0.82 mg/dL (ref 0.76–1.27)
GFR, EST AFRICAN AMERICAN: 107 mL/min/{1.73_m2} (ref >59)
GFR, EST NON AFRICAN AMERICAN: 93 mL/min/{1.73_m2} (ref >59)
GLOBULIN, TOTAL: 2.8 g/dL (ref 1.5–4.5)
Glucose: 111 mg/dL — ABNORMAL HIGH (ref 65–99)
Potassium: 4.5 mmol/L (ref 3.5–5.2)
Sodium: 138 mmol/L (ref 134–144)
TOTAL PROTEIN: 7.2 g/dL (ref 6.0–8.5)

## 2018-03-12 LAB — LIPID PANEL
CHOL/HDL RATIO: 4.5 ratio (ref 0.0–5.0)
Cholesterol, Total: 143 mg/dL (ref 100–199)
HDL: 32 mg/dL — AB (ref >39)
LDL Calculated: 83 mg/dL (ref 0–99)
Triglycerides: 138 mg/dL (ref 0–149)
VLDL Cholesterol Cal: 28 mg/dL (ref 5–40)

## 2018-03-17 ENCOUNTER — Telehealth: Payer: Self-pay

## 2018-03-17 NOTE — Telephone Encounter (Signed)
Patient was called and informed of lab results. 

## 2018-03-19 ENCOUNTER — Telehealth: Payer: Self-pay | Admitting: General Practice

## 2018-03-19 NOTE — Telephone Encounter (Signed)
Pt. Calling to request medical record of colonoscopy 6 years ago. Pt. Advised we may not have records and we may need him to fill our a release form. Pt Asked a request be put in by phone.

## 2018-03-21 NOTE — Telephone Encounter (Signed)
Called Pt. Per Addie. Informed him we did not have a record of a colonoscopy. Pt. Advised he may need to call office he was referred to for procedure.

## 2018-03-22 MED FILL — LANTUS SOLOSTAR 100 UNITS/M: 100 | 30 days supply | Qty: 18 | Fill #2

## 2018-04-04 MED FILL — CLOPIDOGREL 75 MG TABLET: 75 | 30 days supply | Qty: 30 | Fill #3

## 2018-04-04 MED FILL — ATORVASTATIN 20 MG TABLET: 20 | 30 days supply | Qty: 30 | Fill #3

## 2018-04-11 MED FILL — AMLODIPINE BESYLATE 10 MG T: 10 | 30 days supply | Qty: 30 | Fill #0

## 2018-04-12 MED FILL — metFORMIN HCL 500 MG TABS: 500 | 30 days supply | Qty: 120 | Fill #2

## 2018-04-12 MED FILL — LISINOPRIL-HCTZ 20-12.5 MG: 20-12.5 | 30 days supply | Qty: 30 | Fill #1

## 2018-04-15 ENCOUNTER — Ambulatory Visit: Payer: Medicare Other | Admitting: Podiatry

## 2018-04-18 ENCOUNTER — Ambulatory Visit: Payer: Medicare Other | Attending: Family Medicine

## 2018-05-12 MED FILL — CLOPIDOGREL 75 MG TABLET: 75 | 30 days supply | Qty: 30 | Fill #0

## 2018-05-12 MED FILL — ATORVASTATIN 20 MG TABLET: 20 | 30 days supply | Qty: 30 | Fill #0

## 2018-05-16 MED FILL — TRUE METRIX TEST STRIP: 30 days supply | Qty: 100 | Fill #2

## 2018-05-20 MED FILL — AMLODIPINE BESYLATE 10 MG T: 10 | 30 days supply | Qty: 30 | Fill #1

## 2018-06-02 MED FILL — LANTUS SOLOSTAR 100 UNITS/M: 100 | 30 days supply | Qty: 18 | Fill #3

## 2018-06-02 MED FILL — metFORMIN HCL 500 MG TABS: 500 | 30 days supply | Qty: 120 | Fill #3

## 2018-06-08 MED FILL — CLOPIDOGREL 75 MG TABLET: 75 | 30 days supply | Qty: 30 | Fill #1

## 2018-06-08 MED FILL — LISINOPRIL-HCTZ 20-12.5 MG: 20-12.5 | 30 days supply | Qty: 30 | Fill #2

## 2018-06-14 ENCOUNTER — Ambulatory Visit: Payer: Medicare Other | Attending: Family Medicine | Admitting: Family Medicine

## 2018-06-14 ENCOUNTER — Encounter: Payer: Self-pay | Admitting: Family Medicine

## 2018-06-14 VITALS — BP 134/76 | HR 77 | Temp 97.6°F | Ht 71.0 in | Wt 205.0 lb

## 2018-06-14 DIAGNOSIS — E119 Type 2 diabetes mellitus without complications: Secondary | ICD-10-CM

## 2018-06-14 DIAGNOSIS — Z7982 Long term (current) use of aspirin: Secondary | ICD-10-CM | POA: Diagnosis not present

## 2018-06-14 DIAGNOSIS — Z79899 Other long term (current) drug therapy: Secondary | ICD-10-CM | POA: Insufficient documentation

## 2018-06-14 DIAGNOSIS — I10 Essential (primary) hypertension: Secondary | ICD-10-CM | POA: Diagnosis not present

## 2018-06-14 DIAGNOSIS — Z794 Long term (current) use of insulin: Secondary | ICD-10-CM | POA: Diagnosis not present

## 2018-06-14 DIAGNOSIS — Z1211 Encounter for screening for malignant neoplasm of colon: Secondary | ICD-10-CM | POA: Diagnosis not present

## 2018-06-14 DIAGNOSIS — Z7902 Long term (current) use of antithrombotics/antiplatelets: Secondary | ICD-10-CM | POA: Insufficient documentation

## 2018-06-14 DIAGNOSIS — I2510 Atherosclerotic heart disease of native coronary artery without angina pectoris: Secondary | ICD-10-CM

## 2018-06-14 DIAGNOSIS — Z8619 Personal history of other infectious and parasitic diseases: Secondary | ICD-10-CM | POA: Diagnosis not present

## 2018-06-14 DIAGNOSIS — E7800 Pure hypercholesterolemia, unspecified: Secondary | ICD-10-CM | POA: Diagnosis not present

## 2018-06-14 LAB — POCT GLYCOSYLATED HEMOGLOBIN (HGB A1C): HBA1C, POC (CONTROLLED DIABETIC RANGE): 7.3 % — AB (ref 0.0–7.0)

## 2018-06-14 LAB — GLUCOSE, POCT (MANUAL RESULT ENTRY): POC Glucose: 84 mg/dl (ref 70–99)

## 2018-06-14 MED ORDER — AMLODIPINE BESYLATE 10 MG PO TABS: 10.0000 mg | ORAL_TABLET | Freq: Every day | ORAL | 3 refills | Status: DC

## 2018-06-14 MED ORDER — INSULIN ASPART 100 UNIT/ML FLEXPEN
0.0000 [IU] | PEN_INJECTOR | Freq: Three times a day (TID) | SUBCUTANEOUS | 3 refills | Status: DC
Start: 2018-06-14 — End: 2018-09-28

## 2018-06-14 MED ORDER — CLOPIDOGREL BISULFATE 75 MG PO TABS: 75.0000 mg | ORAL_TABLET | Freq: Every day | ORAL | 3 refills | Status: DC

## 2018-06-14 MED ORDER — INSULIN GLARGINE 100 UNIT/ML SOLOSTAR PEN: 55.0000 [IU] | PEN_INJECTOR | Freq: Every day | SUBCUTANEOUS | 3 refills | Status: DC

## 2018-06-14 MED ORDER — LISINOPRIL-HYDROCHLOROTHIAZIDE 20-12.5 MG PO TABS: 1.0000 | ORAL_TABLET | Freq: Every day | ORAL | 3 refills | Status: DC

## 2018-06-14 MED ORDER — METFORMIN HCL 500 MG PO TABS: ORAL_TABLET | ORAL | 3 refills | Status: DC

## 2018-06-14 MED ORDER — ATORVASTATIN CALCIUM 20 MG PO TABS: 20.0000 mg | ORAL_TABLET | Freq: Every day | ORAL | 3 refills | Status: DC

## 2018-06-14 MED FILL — ATORVASTATIN 20 MG TABLET: 20 | 30 days supply | Qty: 30 | Fill #1

## 2018-06-14 MED FILL — AMLODIPINE BESYLATE 10 MG T: 10 | 30 days supply | Qty: 30 | Fill #2

## 2018-06-14 NOTE — Progress Notes (Signed)
Subjective:  Patient ID: Jesse Wilcox, male    DOB: [birth year]  Age: 67 y.o. MRN: [id number]  CC: Diabetes   HPI Jesse Wilcox is a 67 year old male with a history of uncontrolled type 2 diabetes mellitus (A1c 7.3), hypertension, hyperlipidemia, hepatitis C who comes in for a follow-up visit. His A1c is 7.3 which has improved from 8.5 previously and fasting sugars have been in the 67-100 range and he denies excessive sweating or lightheadedness with a blood sugar of 67 but states he knows to take candy when his sugar is in the 67 range.  Denies blurry vision with his last eye exam in 2017; also denies neuropathy. Doing well on his antihypertensive and statin with no adverse effects from his medications. He does not exercise regularly but [facility] With regards to his healthcare maintenance I have referred him for a colonoscopy however notes indicate [facility] was unable to contact him.  Past Medical History:  Diagnosis Date  . Carotid artery occlusion   . Chronic hepatitis C ([place])   . Diabetes mellitus     Past Surgical History:  Procedure Laterality Date  . Aortic arch angiogram  04-15-12   By Dr. Einar Gip  . ARCH AORTOGRAM  04/15/2012   Procedure: ARCH AORTOGRAM;  Surgeon: Laverda Page, MD;  Location: [facility] CATH LAB;  Service: Cardiovascular;;  . CAROTID ANGIOGRAM N/A 04/15/2012   Procedure: CAROTID ANGIOGRAM;  Surgeon: Laverda Page, MD;  Location: [facility] CATH LAB;  Service: Cardiovascular;  Laterality: N/A;  . CAROTID STENT INSERTION N/A 08/23/2012   Procedure: CAROTID STENT INSERTION;  Surgeon: Elam Dutch, MD;  Location: [facility] CATH LAB;  Service: Cardiovascular;  Laterality: N/A;    No Known Allergies   Outpatient Medications Prior to Visit  Medication Sig Dispense Refill  . aspirin EC 81 MG tablet Take 1 tablet (81 mg total) by mouth daily. 30 tablet 3  . B-D ULTRAFINE III SHORT PEN 31G X 8 MM MISC USE AS DIRECTED 4 TIMES DAILY AFTER MEALS  AND AT BEDTIME 100 each 11  . baclofen (LIORESAL) 10 MG tablet Take 1 tablet (10 mg total) by mouth 3 (three) times daily as needed for muscle spasms. 30 each 0  . Blood Glucose Monitoring Suppl (TRUE METRIX METER) w/Device KIT Use to check blood sugar as directed 1 kit 0  . glucose blood test strip Use as instructed 100 each 12  . Miconazole POWD Applied to area of irritation around the foreskin twice daily 1 Bottle 2  . Multiple Vitamin (MULTIVITAMIN WITH MINERALS) TABS Take 1 tablet by mouth daily.    . TRUEPLUS LANCETS 28G MISC Use as directed 100 each 12  . amLODipine (NORVASC) 10 MG tablet Take 1 tablet (10 mg total) by mouth daily. 30 tablet 3  . atorvastatin (LIPITOR) 20 MG tablet Take 1 tablet (20 mg total) by mouth daily. 30 tablet 3  . clopidogrel (PLAVIX) 75 MG tablet Take 1 tablet (75 mg total) by mouth daily. 30 tablet 3  . Insulin Glargine (LANTUS) 100 UNIT/ML Solostar Pen Inject 55 Units into the skin daily. 6 pen 3  . lisinopril-hydrochlorothiazide (PRINZIDE,ZESTORETIC) 20-12.5 MG tablet Take 1 tablet by mouth daily. 30 tablet 3  . metFORMIN (GLUCOPHAGE) 500 MG tablet TAKE 2 TABLETS BY MOUTH 2 TIMES DAILY WITH A MEAL 120 tablet 3  . docusate sodium (COLACE) 100 MG capsule Take 1 capsule (100 mg total) by mouth every 12 (twelve) hours. (  Patient not taking: Reported on 03/11/2018) 20 capsule 0  . HYDROcodone-acetaminophen (NORCO) 5-325 MG tablet Take 1-2 tablets by mouth every 6 (six) hours as needed for severe pain. (Patient not taking: Reported on 06/14/2018) 6 tablet 0  . traZODone (DESYREL) 50 MG tablet Take 1 tablet (50 mg total) by mouth at bedtime as needed for sleep. (Patient not taking: Reported on 06/14/2018) 30 tablet 3  . insulin aspart (NOVOLOG FLEXPEN) 100 UNIT/ML FlexPen Inject 0-12 Units into the skin 3 (three) times daily with meals. As per sliding scale (Patient not taking: Reported on 11/09/2017) 15 mL 3   No facility-administered medications prior to visit.      ROS Review of Systems  Constitutional: Negative for activity change and appetite change.  HENT: Negative for sinus pressure and sore throat.   Eyes: Negative for visual disturbance.  Respiratory: Negative for cough, chest tightness and shortness of breath.   Cardiovascular: Negative for chest pain and leg swelling.  Gastrointestinal: Negative for abdominal distention, abdominal pain, constipation and diarrhea.  Endocrine: Negative.   Genitourinary: Negative for dysuria.  Musculoskeletal: Negative for joint swelling and myalgias.  Skin: Negative for rash.  Allergic/Immunologic: Negative.   Neurological: Negative for weakness, light-headedness and numbness.  Psychiatric/Behavioral: Negative for dysphoric mood and suicidal ideas.    Objective:  BP 134/76   Pulse 77   Temp 97.6 F (36.4 C) (Oral)   Ht _0  (1.803 m)   Wt 205 lb (93 kg)   SpO2 95%   BMI 28.59 kg/m   BP/Weight 06/14/2018 03/11/2018 8/88/2800  Systolic BP 349 179 150  Diastolic BP 76 70 80  Wt. (Lbs) 205 205.2 210  BMI 28.59 28.62 29.29  Some encounter information is confidential and restricted. Go to Review Flowsheets activity to see all data.      Physical Exam  Constitutional: He is oriented to person, place, and time. He appears well-developed and well-nourished.  Cardiovascular: Normal rate, normal heart sounds and intact distal pulses.  No murmur heard. Pulmonary/Chest: Effort normal and breath sounds normal. He has no wheezes. He has no rales. He exhibits no tenderness.  Abdominal: Soft. Bowel sounds are normal. He exhibits no distension and no mass. There is no tenderness.  Musculoskeletal: Normal range of motion.  Neurological: He is alert and oriented to person, place, and time.  Skin: Skin is warm and dry.  Psychiatric: He has a normal mood and affect.    Lab Results  Component Value Date   HGBA1C 7.3 (A) 06/14/2018    Assessment & Plan:   1. Type 2 diabetes mellitus without  complication, with long-term current use of insulin (HCC) Controlled with A1c of 7.3 which has improved from 8.5 previously He has had some episodes of low sugars and has been advised to decrease his Lantus by 2 units in the event of hypoglycemia; we have discussed management of hypoglycemia. Counseled on Diabetic diet, my plate method, 569 minutes of moderate intensity exercise/week Keep blood sugar logs with fasting goals of 80-120 mg/dl, random of less than 180 and in the event of sugars less than 60 mg/dl or greater than 400 mg/dl please notify the clinic ASAP. It is recommended that you undergo annual eye exams and annual foot exams. Pneumonia vaccine is recommended. - POCT glycosylated hemoglobin (Hb A1C) - POCT glucose (manual entry) - Ambulatory referral to Ophthalmology - metFORMIN (GLUCOPHAGE) 500 MG tablet; TAKE 2 TABLETS BY MOUTH 2 TIMES DAILY WITH A MEAL  Dispense: 120 tablet; Refill: 3 -  Insulin Glargine (LANTUS) 100 UNIT/ML Solostar Pen; Inject 55 Units into the skin daily.  Dispense: 6 pen; Refill: 3 - insulin aspart (NOVOLOG FLEXPEN) 100 UNIT/ML FlexPen; Inject 0-12 Units into the skin 3 (three) times daily with meals. As per sliding scale  Dispense: 15 mL; Refill: 3  2. Essential hypertension Controlled Counseled on blood pressure goal of less than 130/80, low-sodium, DASH diet, medication compliance, 150 minutes of moderate intensity exercise per week. Discussed medication compliance, adverse effects. - lisinopril-hydrochlorothiazide (PRINZIDE,ZESTORETIC) 20-12.5 MG tablet; Take 1 tablet by mouth daily.  Dispense: 30 tablet; Refill: 3 - amLODipine (NORVASC) 10 MG tablet; Take 1 tablet (10 mg total) by mouth daily.  Dispense: 30 tablet; Refill: 3  3. ASCVD (arteriosclerotic cardiovascular disease) Stable - clopidogrel (PLAVIX) 75 MG tablet; Take 1 tablet (75 mg total) by mouth daily.  Dispense: 30 tablet; Refill: 3  4. Pure hypercholesterolemia Controlled Low-cholesterol  diet - atorvastatin (LIPITOR) 20 MG tablet; Take 1 tablet (20 mg total) by mouth daily.  Dispense: 30 tablet; Refill: 3  5. Screening for colon cancer Referred to GI in several occasions and he has missed his appointment - Ambulatory referral to Gastroenterology   Meds ordered this encounter  Medications  . metFORMIN (GLUCOPHAGE) 500 MG tablet    Sig: TAKE 2 TABLETS BY MOUTH 2 TIMES DAILY WITH A MEAL    Dispense:  120 tablet    Refill:  3  . lisinopril-hydrochlorothiazide (PRINZIDE,ZESTORETIC) 20-12.5 MG tablet    Sig: Take 1 tablet by mouth daily.    Dispense:  30 tablet    Refill:  3  . Insulin Glargine (LANTUS) 100 UNIT/ML Solostar Pen    Sig: Inject 55 Units into the skin daily.    Dispense:  6 pen    Refill:  3  . insulin aspart (NOVOLOG FLEXPEN) 100 UNIT/ML FlexPen    Sig: Inject 0-12 Units into the skin 3 (three) times daily with meals. As per sliding scale    Dispense:  15 mL    Refill:  3  . clopidogrel (PLAVIX) 75 MG tablet    Sig: Take 1 tablet (75 mg total) by mouth daily.    Dispense:  30 tablet    Refill:  3  . atorvastatin (LIPITOR) 20 MG tablet    Sig: Take 1 tablet (20 mg total) by mouth daily.    Dispense:  30 tablet    Refill:  3  . amLODipine (NORVASC) 10 MG tablet    Sig: Take 1 tablet (10 mg total) by mouth daily.    Dispense:  30 tablet    Refill:  3    Follow-up: Return in about 3 months (around 09/14/2018) for Follow-up of chronic medical conditions.   Charlott Rakes MD

## 2018-06-14 NOTE — Patient Instructions (Signed)

## 2018-06-22 ENCOUNTER — Encounter: Payer: Self-pay | Admitting: Gastroenterology

## 2018-06-27 ENCOUNTER — Encounter: Payer: Self-pay | Admitting: Family Medicine

## 2018-06-27 DIAGNOSIS — H2513 Age-related nuclear cataract, bilateral: Secondary | ICD-10-CM | POA: Diagnosis not present

## 2018-06-27 DIAGNOSIS — H40013 Open angle with borderline findings, low risk, bilateral: Secondary | ICD-10-CM | POA: Diagnosis not present

## 2018-06-27 DIAGNOSIS — H10413 Chronic giant papillary conjunctivitis, bilateral: Secondary | ICD-10-CM | POA: Diagnosis not present

## 2018-06-27 DIAGNOSIS — E119 Type 2 diabetes mellitus without complications: Secondary | ICD-10-CM | POA: Diagnosis not present

## 2018-06-27 LAB — HM DIABETES EYE EXAM

## 2018-07-11 MED FILL — LISINOPRIL-HCTZ 20-12.5 MG: 20-12.5 | 30 days supply | Qty: 30 | Fill #3

## 2018-07-18 MED FILL — ATORVASTATIN 20 MG TABLET: 20 | 30 days supply | Qty: 30 | Fill #2

## 2018-07-18 MED FILL — CLOPIDOGREL 75 MG TABLET: 75 | 30 days supply | Qty: 30 | Fill #0

## 2018-07-18 MED FILL — LANTUS SOLOSTAR 100 UNITS/M: 100 | 29 days supply | Qty: 18 | Fill #0

## 2018-07-25 MED FILL — LANTUS SOLOSTAR 100 UNITS/M: 100 | 29 days supply | Qty: 18 | Fill #1

## 2018-07-25 MED FILL — metFORMIN HCL 500 MG TABS: 500 | 30 days supply | Qty: 120 | Fill #0

## 2018-07-26 MED FILL — AMLODIPINE BESYLATE 10 MG T: 10 | 30 days supply | Qty: 30 | Fill #3

## 2018-07-26 MED FILL — TRUEplus 5-BEVEL PEN NEEDLE: 31G X 8 MM | 30 days supply | Qty: 100 | Fill #2

## 2018-08-09 ENCOUNTER — Ambulatory Visit (INDEPENDENT_AMBULATORY_CARE_PROVIDER_SITE_OTHER): Payer: Medicare Other | Admitting: Gastroenterology

## 2018-08-09 ENCOUNTER — Encounter: Payer: Self-pay | Admitting: Gastroenterology

## 2018-08-09 ENCOUNTER — Encounter (INDEPENDENT_AMBULATORY_CARE_PROVIDER_SITE_OTHER): Payer: Self-pay

## 2018-08-09 ENCOUNTER — Telehealth: Payer: Self-pay | Admitting: Gastroenterology

## 2018-08-09 VITALS — BP 122/72 | HR 80 | Ht 71.0 in | Wt 205.0 lb

## 2018-08-09 DIAGNOSIS — Z7901 Long term (current) use of anticoagulants: Secondary | ICD-10-CM

## 2018-08-09 DIAGNOSIS — Z1211 Encounter for screening for malignant neoplasm of colon: Secondary | ICD-10-CM

## 2018-08-09 MED ORDER — PEG 3350-KCL-NA BICARB-NACL 420 G PO SOLR: 4000.0000 mL | ORAL | 0 refills | Status: DC

## 2018-08-09 NOTE — Progress Notes (Signed)
HPI: This is a very pleasant 67 year old man who was referred to me by Charlott Rakes, MD  to evaluate colon cancer screening.    Chief complaint is routine risk for colon cancer  Colonoscopy >10 years ago for routine screening. He believes it was normal.  He tells me this was done at [facility].  He is not sure what doctor did it or what exactly was found but he believes it was normal.  I cannot find any records of that colonoscopy through the epic system  No FH of colon cancer  He has no issues with GI bleeding, change in bowel habits, change in weight, significant abdominal pains.  Old Data Reviewed:  Labs May 2019 show a hemoglobin A1c of 8.5, complete metabolic profile was normal repeat hemoglobin A1c August 2019 7.3   Review of systems: Pertinent positive and negative review of systems were noted in the above HPI section. All other review negative.   Past Medical History:  Diagnosis Date  . Carotid artery occlusion   . Chronic hepatitis C ([place])   . Diabetes mellitus     Past Surgical History:  Procedure Laterality Date  . Aortic arch angiogram  04-15-12   By Dr. Einar Gip  . ARCH AORTOGRAM  04/15/2012   Procedure: ARCH AORTOGRAM;  Surgeon: Laverda Page, MD;  Location: [facility] CATH LAB;  Service: Cardiovascular;;  . CAROTID ANGIOGRAM N/A 04/15/2012   Procedure: CAROTID ANGIOGRAM;  Surgeon: Laverda Page, MD;  Location: [facility] CATH LAB;  Service: Cardiovascular;  Laterality: N/A;  . CAROTID STENT INSERTION N/A 08/23/2012   Procedure: CAROTID STENT INSERTION;  Surgeon: Elam Dutch, MD;  Location: [facility] CATH LAB;  Service: Cardiovascular;  Laterality: N/A;    Current Outpatient Medications  Medication Sig Dispense Refill  . amLODipine (NORVASC) 10 MG tablet Take 1 tablet (10 mg total) by mouth daily. 30 tablet 3  . aspirin EC 81 MG tablet Take 1 tablet (81 mg total) by mouth daily. 30 tablet 3  . atorvastatin (LIPITOR) 20 MG tablet Take 1 tablet (20 mg total) by  mouth daily. 30 tablet 3  . B-D ULTRAFINE III SHORT PEN 31G X 8 MM MISC USE AS DIRECTED 4 TIMES DAILY AFTER MEALS AND AT BEDTIME 100 each 11  . baclofen (LIORESAL) 10 MG tablet Take 1 tablet (10 mg total) by mouth 3 (three) times daily as needed for muscle spasms. 30 each 0  . Blood Glucose Monitoring Suppl (TRUE METRIX METER) w/Device KIT Use to check blood sugar as directed 1 kit 0  . clopidogrel (PLAVIX) 75 MG tablet Take 1 tablet (75 mg total) by mouth daily. 30 tablet 3  . docusate sodium (COLACE) 100 MG capsule Take 1 capsule (100 mg total) by mouth every 12 (twelve) hours. 20 capsule 0  . glucose blood test strip Use as instructed 100 each 12  . HYDROcodone-acetaminophen (NORCO) 5-325 MG tablet Take 1-2 tablets by mouth every 6 (six) hours as needed for severe pain. 6 tablet 0  . insulin aspart (NOVOLOG FLEXPEN) 100 UNIT/ML FlexPen Inject 0-12 Units into the skin 3 (three) times daily with meals. As per sliding scale 15 mL 3  . Insulin Glargine (LANTUS) 100 UNIT/ML Solostar Pen Inject 55 Units into the skin daily. 6 pen 3  . lisinopril-hydrochlorothiazide (PRINZIDE,ZESTORETIC) 20-12.5 MG tablet Take 1 tablet by mouth daily. 30 tablet 3  . metFORMIN (GLUCOPHAGE) 500 MG tablet TAKE 2 TABLETS BY MOUTH 2 TIMES DAILY WITH A MEAL 120 tablet 3  .  Miconazole POWD Applied to area of irritation around the foreskin twice daily 1 Bottle 2  . Multiple Vitamin (MULTIVITAMIN WITH MINERALS) TABS Take 1 tablet by mouth daily.    . traZODone (DESYREL) 50 MG tablet Take 1 tablet (50 mg total) by mouth at bedtime as needed for sleep. 30 tablet 3  . TRUEPLUS LANCETS 28G MISC Use as directed 100 each 12   No current facility-administered medications for this visit.     Allergies as of 08/09/2018  . (No Known Allergies)    Family History  Problem Relation Age of Onset  . Diabetes Mother   . Heart disease Mother   . Hypertension Mother   . Heart attack Mother   . Diabetes Father   . Heart disease  Father   . Hypertension Father   . Diabetes Sister   . Hyperlipidemia Sister   . Heart disease Sister   . Heart attack Sister   . Peripheral vascular disease Sister   . Hyperlipidemia Brother   . Hypertension Brother     Social History   Socioeconomic History  . Marital status: Single    Spouse name: Not on file  . Number of children: Not on file  . Years of education: Not on file  . Highest education level: Not on file  Occupational History  . Not on file  Social Needs  . Financial resource strain: Not on file  . Food insecurity:    Worry: Not on file    Inability: Not on file  . Transportation needs:    Medical: Not on file    Non-medical: Not on file  Tobacco Use  . Smoking status: Current Every Day Smoker    Packs/day: 1.00    Years: 47.00    Pack years: 47.00    Types: Cigarettes  . Smokeless tobacco: Never Used  Substance and Sexual Activity  . 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 Drug use: No  . Sexual activity: Not on file  Lifestyle  . Physical activity:    Days per week: Not on file    Minutes per session: Not on file  . Stress: Not on file  Relationships  . Social connections:    Talks on phone: Not on file    Gets together: Not on file    Attends religious service: Not on file    Active member of club or organization: Not on file    Attends meetings of clubs or organizations: Not on file    Relationship status: Not on file  . Intimate partner violence:    Fear of current or ex partner: Not on file    Emotionally abused: Not on file    Physically abused: Not on file    Forced sexual activity: Not on file  Other Topics Concern  . Not on file  Social History Narrative  . Not on file     Physical Exam: BP 122/72   Pulse 80   Ht '5\' 11"'$  (1.803 m)   Wt 205 lb (93 kg)   BMI 28.59 kg/m  Constitutional: generally well-appearing Psychiatric: alert and oriented x3 Eyes: extraocular movements  intact Mouth: oral pharynx moist, no lesions Neck: supple no lymphadenopathy Cardiovascular: heart regular rate and rhythm Lungs: clear to auscultation bilaterally Abdomen: soft, nontender, nondistended, no obvious ascites, no peritoneal signs, normal bowel sounds Extremities: no lower extremity edema bilaterally Skin: no lesions on visible extremities  Assessment and plan: 67 y.o. male with routine risk for colon cancer, increased risk for procedural complications such as bleeding since he is on a blood thinner daily  He takes Plavix daily for many years.  I suspect this is because of his coronary artery stent.  We discussed colonoscopy for colon cancer screening.  I recommended that he stop his Plavix for 5 days prior to the colonoscopy.  He understands he would be at slightly increased risk for stent occlusion during that time.  We will communicate with his primary care physician to see if she feels it is safe for him to stop the Plavix.  I see no reason for any further blood tests or imaging studies prior to then.    Please see the "Patient Instructions" section for addition details about the plan.   Owens Loffler, MD [facility] 08/09/2018, 9:32 AM  Cc: Charlott Rakes, MD

## 2018-08-09 NOTE — Patient Instructions (Addendum)
You will be set up for a colonoscopy for colon cancer screening. You should hold your plavix for 5 days prior to the colonoscopy. We will communicate with your PCP about that recommendation to make sure it is safe.  You have been scheduled for a colonoscopy. Please follow written instructions given to you at your visit today.  Please pick up your prep supplies at the pharmacy within the next 1-3 days. If you use inhalers (even only as needed), please bring them with you on the day of your procedure. Your physician has requested that you go to www.startemmi.com and enter the access code given to you at your visit today. This web site gives a general overview about your procedure. However, you should still follow specific instructions given to you by our office regarding your preparation for the procedure.  Thank you for entrusting me with your care and choosing [facility].  Dr Ardis Hughs

## 2018-08-09 NOTE — Telephone Encounter (Signed)
[facility] Pre-operative Risk Assessment     Request for surgical clearance:     Endoscopy Procedure  What type of surgery is being performed?     Colonoscopy  When is this surgery scheduled?     09/28/18  What type of clearance is required ?   Pharmacy  Are there any medications that need to be held prior to surgery and how long? Plpavix  Practice name and name of physician performing surgery?      [facility]  What is your office phone and fax number?      Phone- [phone]  [phone]  Anesthesia type (None, local, MAC, general) ?       MAC

## 2018-08-10 NOTE — Telephone Encounter (Signed)
Okay to hold Plavix for 5 days prior to procedure.  Resume Plavix after procedure.

## 2018-08-11 NOTE — Telephone Encounter (Signed)
Left message on voicemail to call office.  

## 2018-08-11 NOTE — Telephone Encounter (Signed)
Spoke to patient informed him that he may hold plavix 5 days prior to his procedure per Dr Margarita Rana, Patient voiced understanding.

## 2018-08-12 MED FILL — PEG-3350 SOLUTION: 420 | 1 days supply | Qty: 4000 | Fill #0

## 2018-08-22 MED FILL — LISINOPRIL-HCTZ 20-12.5 MG: 20-12.5 | 30 days supply | Qty: 30 | Fill #0

## 2018-08-29 MED FILL — ATORVASTATIN 20 MG TABLET: 20 | 30 days supply | Qty: 30 | Fill #3

## 2018-09-13 MED FILL — AMLODIPINE BESYLATE 10 MG T: 10 | 30 days supply | Qty: 30 | Fill #3

## 2018-09-13 MED FILL — metFORMIN HCL 500 MG TABS: 500 | 30 days supply | Qty: 120 | Fill #1

## 2018-09-14 ENCOUNTER — Ambulatory Visit: Payer: Medicare Other | Admitting: Family Medicine

## 2018-09-26 ENCOUNTER — Other Ambulatory Visit: Payer: Self-pay | Admitting: Family Medicine

## 2018-09-26 DIAGNOSIS — E119 Type 2 diabetes mellitus without complications: Secondary | ICD-10-CM

## 2018-09-26 DIAGNOSIS — Z794 Long term (current) use of insulin: Principal | ICD-10-CM

## 2018-09-26 MED FILL — TRUE METRIX TEST STRIP: 30 days supply | Qty: 100 | Fill #0

## 2018-09-26 MED FILL — LISINOPRIL-HCTZ 20-12.5 MG: 20-12.5 | 30 days supply | Qty: 30 | Fill #1

## 2018-09-28 ENCOUNTER — Encounter: Payer: Self-pay | Admitting: Gastroenterology

## 2018-09-28 ENCOUNTER — Ambulatory Visit (AMBULATORY_SURGERY_CENTER): Payer: Medicare Other | Admitting: Gastroenterology

## 2018-09-28 VITALS — BP 132/66 | HR 71 | Temp 97.8°F | Resp 15 | Ht 71.0 in | Wt 205.0 lb

## 2018-09-28 DIAGNOSIS — D122 Benign neoplasm of ascending colon: Secondary | ICD-10-CM

## 2018-09-28 DIAGNOSIS — E119 Type 2 diabetes mellitus without complications: Secondary | ICD-10-CM | POA: Diagnosis not present

## 2018-09-28 DIAGNOSIS — D124 Benign neoplasm of descending colon: Secondary | ICD-10-CM | POA: Diagnosis not present

## 2018-09-28 DIAGNOSIS — Z1211 Encounter for screening for malignant neoplasm of colon: Secondary | ICD-10-CM

## 2018-09-28 MED ORDER — SODIUM CHLORIDE 0.9 % IV SOLN: 500.0000 mL | Freq: Once | INTRAVENOUS | Status: DC

## 2018-09-28 NOTE — Patient Instructions (Addendum)
Handouts Provided:  Polyps  RESUME Plavix Today.   YOU HAD AN ENDOSCOPIC PROCEDURE TODAY AT [facility]:   Refer to the procedure report that was given to you for any specific questions about what was found during the examination.  If the procedure report does not answer your questions, please call your gastroenterologist to clarify.  If you requested that your care partner not be given the details of your procedure findings, then the procedure report has been included in a sealed envelope for you to review at your convenience later.  YOU SHOULD EXPECT: Some feelings of bloating in the abdomen. Passage of more gas than usual.  Walking can help get rid of the air that was put into your GI tract during the procedure and reduce the bloating. If you had a lower endoscopy (such as a colonoscopy or flexible sigmoidoscopy) you may notice spotting of blood in your stool or on the toilet paper. If you underwent a bowel prep for your procedure, you may not have a normal bowel movement for a few days.  Please Note:  You might notice some irritation and congestion in your nose or some drainage.  This is from the oxygen used during your procedure.  There is no need for concern and it should clear up in a day or so.  SYMPTOMS TO REPORT IMMEDIATELY:   Following lower endoscopy (colonoscopy or flexible sigmoidoscopy):  Excessive amounts of blood in the stool  Significant tenderness or worsening of abdominal pains  Swelling of the abdomen that is new, acute  Fever of 100F or higher  For urgent or emergent issues, a gastroenterologist can be reached at any hour by calling [phone].   DIET:  We do recommend a small meal at first, but then you may proceed to your regular diet.  Drink plenty of fluids but you should avoid alcoholic beverages for 24 hours.  ACTIVITY:  You should plan to take it easy for the rest of today and you should NOT DRIVE or use heavy machinery until tomorrow  (because of the sedation medicines used during the test).    FOLLOW UP: Our staff will call the number listed on your records the next business day following your procedure to check on you and address any questions or concerns that you may have regarding the information given to you following your procedure. If we do not reach you, we will leave a message.  However, if you are feeling well and you are not experiencing any problems, there is no need to return our call.  We will assume that you have returned to your regular daily activities without incident.  If any biopsies were taken you will be contacted by phone or by letter within the next 1-3 weeks.  Please call us at [phone] if you have not heard about the biopsies in 3 weeks.    SIGNATURES/CONFIDENTIALITY: You and/or your care partner have signed paperwork which will be entered into your electronic medical record.  These signatures attest to the fact that that the information above on your After Visit Summary has been reviewed and is understood.  Full responsibility of the confidentiality of this discharge information lies with you and/or your care-partner.

## 2018-09-28 NOTE — Progress Notes (Signed)
Report given to PACU, vss 

## 2018-09-28 NOTE — Op Note (Signed)
[facility] Patient Name: Jesse Wilcox Procedure Date: 09/28/2018 8:28 AM MRN: [id number] Endoscopist: Milus Banister , MD Age: 67 Referring MD:  Date of Birth: April 17, 1951 Gender: Male Account #: [id number] Procedure:                Colonoscopy Indications:              Screening for colorectal malignant neoplasm Medicines:                Monitored Anesthesia Care Procedure:                Pre-Anesthesia Assessment:                           - Prior to the procedure, a History and Physical                            was performed, and patient medications and                            allergies were reviewed. The patient's tolerance of                            previous anesthesia was also reviewed. The risks                            and benefits of the procedure and the sedation                            options and risks were discussed with the patient.                            All questions were answered, and informed consent                            was obtained. Prior Anticoagulants: The patient has                            taken Plavix (clopidogrel), last dose was 5 days                            prior to procedure. ASA Grade Assessment: III - A                            patient with severe systemic disease. After                            reviewing the risks and benefits, the patient was                            deemed in satisfactory condition to undergo the                            procedure.  After obtaining informed consent, the colonoscope                            was passed under direct vision. Throughout the                            procedure, the patient's blood pressure, pulse, and                            oxygen saturations were monitored continuously. The                            Colonoscope was introduced through the anus and                            advanced to the the cecum, identified by                     appendiceal orifice and ileocecal valve. The                            colonoscopy was performed without difficulty. The                            patient tolerated the procedure well. The quality                            of the bowel preparation was good. The ileocecal                            valve, appendiceal orifice, and rectum were                            photographed. Scope In: 8:33:25 AM Scope Out: 8:47:40 AM Scope Withdrawal Time: 0 hours 10 minutes 10 seconds  Total Procedure Duration: 0 hours 14 minutes 15 seconds  Findings:                 Two sessile polyps were found in the descending                            colon and ascending colon. The polyps were 2 to 4                            mm in size. These polyps were removed with a cold                            snare. Resection and retrieval were complete.                           The exam was otherwise without abnormality on                            direct and retroflexion views. Complications:  No immediate complications. Estimated blood loss:                            None. Estimated Blood Loss:     Estimated blood loss: none. Impression:               - Two 2 to 4 mm polyps in the descending colon and                            in the ascending colon, removed with a cold snare.                            Resected and retrieved.                           - The examination was otherwise normal on direct                            and retroflexion views. Recommendation:           - Patient has a contact number available for                            emergencies. The signs and symptoms of potential                            delayed complications were discussed with the                            patient. Return to normal activities tomorrow.                            Written discharge instructions were provided to the                            patient.                            - Resume previous diet.                           - Continue present medications. Resume your plavix                            today.                           You will receive a letter within 2-3 weeks with the                            pathology results and my final recommendations.                           If the polyp(s) is proven to be 'pre-cancerous' on  pathology, you will need repeat colonoscopy in 5                            years. If the polyp(s) is NOT 'precancerous' on                            pathology then you should repeat colon cancer                            screening in 10 years with colonoscopy without need                            for colon cancer screening by any method prior to                            then (including stool testing). Milus Banister, MD 09/28/2018 8:51:04 AM This report has been signed electronically.

## 2018-10-03 ENCOUNTER — Telehealth: Payer: Self-pay | Admitting: *Deleted

## 2018-10-03 ENCOUNTER — Ambulatory Visit: Payer: Medicare Other | Admitting: Family Medicine

## 2018-10-03 NOTE — Telephone Encounter (Signed)
  Follow up Call-  Call back number 09/28/2018  Post procedure Call Back phone  # [phone]  Permission to leave phone message Yes  Some recent data might be hidden     Patient questions:  Do you have a fever, pain , or abdominal swelling? No. Pain Score  0 *  Have you tolerated food without any problems? Yes.    Have you been able to return to your normal activities? Yes.    Do you have any questions about your discharge instructions: Diet   No. Medications  No. Follow up visit  No.  Do you have questions or concerns about your Care? No.  Actions: * If pain score is 4 or above: No action needed, pain <4.

## 2018-10-03 NOTE — Telephone Encounter (Signed)
  Follow up Call-  Call back number 09/28/2018  Post procedure Call Back phone  # [phone]  Permission to leave phone message Yes  Some recent data might be hidden     No answer at # given.  LM on VM.

## 2018-10-04 MED FILL — LANTUS SOLOSTAR 100 UNITS/M: 100 | 29 days supply | Qty: 18 | Fill #2

## 2018-10-04 MED FILL — ATORVASTATIN 20 MG TABLET: 20 | 30 days supply | Qty: 30 | Fill #0

## 2018-10-07 ENCOUNTER — Encounter: Payer: Self-pay | Admitting: Gastroenterology

## 2018-10-13 MED FILL — metFORMIN HCL 500 MG TABS: 500 | 30 days supply | Qty: 120 | Fill #2

## 2018-10-13 MED FILL — AMLODIPINE BESYLATE 10 MG T: 10 | 30 days supply | Qty: 30 | Fill #0

## 2018-10-24 ENCOUNTER — Ambulatory Visit: Payer: Medicare Other | Attending: Family Medicine

## 2018-10-31 MED FILL — LISINOPRIL-HCTZ 20-12.5 MG: 20-12.5 | 30 days supply | Qty: 30 | Fill #2

## 2018-10-31 MED FILL — traZODone HCL 50 MG TABS: 50 | 30 days supply | Qty: 30 | Fill #0

## 2018-11-09 ENCOUNTER — Ambulatory Visit: Payer: Medicare Other | Attending: Family Medicine | Admitting: Family Medicine

## 2018-11-09 ENCOUNTER — Encounter: Payer: Self-pay | Admitting: Family Medicine

## 2018-11-09 VITALS — BP 147/78 | HR 86 | Temp 97.7°F | Ht 71.0 in | Wt 210.6 lb

## 2018-11-09 DIAGNOSIS — Z7982 Long term (current) use of aspirin: Secondary | ICD-10-CM | POA: Diagnosis not present

## 2018-11-09 DIAGNOSIS — E1169 Type 2 diabetes mellitus with other specified complication: Secondary | ICD-10-CM | POA: Insufficient documentation

## 2018-11-09 DIAGNOSIS — E1165 Type 2 diabetes mellitus with hyperglycemia: Secondary | ICD-10-CM | POA: Diagnosis not present

## 2018-11-09 DIAGNOSIS — I10 Essential (primary) hypertension: Secondary | ICD-10-CM | POA: Diagnosis not present

## 2018-11-09 DIAGNOSIS — Z794 Long term (current) use of insulin: Secondary | ICD-10-CM

## 2018-11-09 DIAGNOSIS — Z79899 Other long term (current) drug therapy: Secondary | ICD-10-CM | POA: Insufficient documentation

## 2018-11-09 DIAGNOSIS — E785 Hyperlipidemia, unspecified: Secondary | ICD-10-CM | POA: Diagnosis not present

## 2018-11-09 DIAGNOSIS — E7800 Pure hypercholesterolemia, unspecified: Secondary | ICD-10-CM

## 2018-11-09 DIAGNOSIS — Z23 Encounter for immunization: Secondary | ICD-10-CM | POA: Diagnosis not present

## 2018-11-09 DIAGNOSIS — D126 Benign neoplasm of colon, unspecified: Secondary | ICD-10-CM | POA: Insufficient documentation

## 2018-11-09 DIAGNOSIS — G4709 Other insomnia: Secondary | ICD-10-CM | POA: Diagnosis not present

## 2018-11-09 DIAGNOSIS — B182 Chronic viral hepatitis C: Secondary | ICD-10-CM | POA: Insufficient documentation

## 2018-11-09 LAB — POCT GLYCOSYLATED HEMOGLOBIN (HGB A1C): Hemoglobin A1C: 9.7 % — AB (ref 4.0–5.6)

## 2018-11-09 LAB — GLUCOSE, POCT (MANUAL RESULT ENTRY): POC GLUCOSE: 218 mg/dL — AB (ref 70–99)

## 2018-11-09 MED ORDER — ATORVASTATIN CALCIUM 20 MG PO TABS: 20.0000 mg | ORAL_TABLET | Freq: Every day | ORAL | 3 refills | Status: DC

## 2018-11-09 MED ORDER — METFORMIN HCL 500 MG PO TABS: ORAL_TABLET | ORAL | 3 refills | Status: DC

## 2018-11-09 MED ORDER — INSULIN GLARGINE 100 UNIT/ML SOLOSTAR PEN: 60.0000 [IU] | PEN_INJECTOR | Freq: Every day | SUBCUTANEOUS | 3 refills | Status: DC

## 2018-11-09 MED ORDER — TETANUS-DIPHTH-ACELL PERTUSSIS 5-2.5-18.5 LF-MCG/0.5 IM SUSP: 0.5000 mL | Freq: Once | INTRAMUSCULAR | 0 refills | Status: AC

## 2018-11-09 MED ORDER — LISINOPRIL-HYDROCHLOROTHIAZIDE 20-12.5 MG PO TABS: 1.0000 | ORAL_TABLET | Freq: Every day | ORAL | 3 refills | Status: DC

## 2018-11-09 MED ORDER — TRAZODONE HCL 50 MG PO TABS: 50.0000 mg | ORAL_TABLET | Freq: Every evening | ORAL | 3 refills | Status: DC | PRN

## 2018-11-09 MED ORDER — AMLODIPINE BESYLATE 10 MG PO TABS: 10.0000 mg | ORAL_TABLET | Freq: Every day | ORAL | 3 refills | Status: DC

## 2018-11-09 MED FILL — AMLODIPINE BESYLATE 10 MG T: 10 | 30 days supply | Qty: 30 | Fill #0

## 2018-11-09 MED FILL — metFORMIN HCL 500 MG TABS: 500 | 30 days supply | Qty: 120 | Fill #0

## 2018-11-09 MED FILL — ATORVASTATIN 20 MG TABLET: 20 | 30 days supply | Qty: 30 | Fill #0

## 2018-11-09 MED FILL — !LANTUS SOLOSTAR 100UNITS/M: 100 | 20 days supply | Qty: 12 | Fill #0

## 2018-11-09 NOTE — Patient Instructions (Signed)
Diabetes Mellitus and Nutrition, Adult  When you have diabetes (diabetes mellitus), it is very important to have healthy eating habits because your blood sugar (glucose) levels are greatly affected by what you eat and drink. Eating healthy foods in the appropriate amounts, at about the same times every day, can help you:  · Control your blood glucose.  · Lower your risk of heart disease.  · Improve your blood pressure.  · Reach or maintain a healthy weight.  Every person with diabetes is different, and each person has different needs for a meal plan. Your health care provider may recommend that you work with a diet and nutrition specialist (dietitian) to make a meal plan that is best for you. Your meal plan may vary depending on factors such as:  · The calories you need.  · The medicines you take.  · Your weight.  · Your blood glucose, blood pressure, and cholesterol levels.  · Your activity level.  · Other health conditions you have, such as heart or kidney disease.  How do carbohydrates affect me?  Carbohydrates, also called carbs, affect your blood glucose level more than any other type of food. Eating carbs naturally raises the amount of glucose in your blood. Carb counting is a method for keeping track of how many carbs you eat. Counting carbs is important to keep your blood glucose at a healthy level, especially if you use insulin or take certain oral diabetes medicines.  It is important to know how many carbs you can safely have in each meal. This is different for every person. Your dietitian can help you calculate how many carbs you should have at each meal and for each snack.  Foods that contain carbs include:  · Bread, cereal, rice, pasta, and crackers.  · Potatoes and corn.  · Peas, beans, and lentils.  · Milk and yogurt.  · Fruit and juice.  · Desserts, such as cakes, cookies, ice cream, and candy.  How does alcohol affect me?  Alcohol can cause a sudden decrease in blood glucose (hypoglycemia),  especially if you use insulin or take certain oral diabetes medicines. Hypoglycemia can be a life-threatening condition. Symptoms of hypoglycemia (sleepiness, dizziness, and confusion) are similar to symptoms of having too much alcohol.  If your health care provider says that alcohol is safe for you, follow these guidelines:  · Limit alcohol intake to no more than 1 drink per day for nonpregnant women and 2 drinks per day for men. One drink equals 12 oz of beer, 5 oz of wine, or 1½ oz of hard liquor.  · Do not drink on an empty stomach.  · Keep yourself hydrated with water, diet soda, or unsweetened iced tea.  · Keep in mind that regular soda, juice, and other mixers may contain a lot of sugar and must be counted as carbs.  What are tips for following this plan?    Reading food labels  · Start by checking the serving size on the "Nutrition Facts" label of packaged foods and drinks. The amount of calories, carbs, fats, and other nutrients listed on the label is based on one serving of the item. Many items contain more than one serving per package.  · Check the total grams (g) of carbs in one serving. You can calculate the number of servings of carbs in one serving by dividing the total carbs by 15. For example, if a food has 30 g of total carbs, it would be equal to 2   servings of carbs.  · Check the number of grams (g) of saturated and trans fats in one serving. Choose foods that have low or no amount of these fats.  · Check the number of milligrams (mg) of salt (sodium) in one serving. Most people should limit total sodium intake to less than 2,300 mg per day.  · Always check the nutrition information of foods labeled as "low-fat" or "nonfat". These foods may be higher in added sugar or refined carbs and should be avoided.  · Talk to your dietitian to identify your daily goals for nutrients listed on the label.  Shopping  · Avoid buying canned, premade, or processed foods. These foods tend to be high in fat, sodium,  and added sugar.  · Shop around the outside edge of the grocery store. This includes fresh fruits and vegetables, bulk grains, fresh meats, and fresh dairy.  Cooking  · Use low-heat cooking methods, such as baking, instead of high-heat cooking methods like deep frying.  · Cook using healthy oils, such as olive, canola, or sunflower oil.  · Avoid cooking with butter, cream, or high-fat meats.  Meal planning  · Eat meals and snacks regularly, preferably at the same times every day. Avoid going long periods of time without eating.  · Eat foods high in fiber, such as fresh fruits, vegetables, beans, and whole grains. Talk to your dietitian about how many servings of carbs you can eat at each meal.  · Eat 4-6 ounces (oz) of lean protein each day, such as lean meat, chicken, fish, eggs, or tofu. One oz of lean protein is equal to:  ? 1 oz of meat, chicken, or fish.  ? 1 egg.  ? ¼ cup of tofu.  · Eat some foods each day that contain healthy fats, such as avocado, nuts, seeds, and fish.  Lifestyle  · Check your blood glucose regularly.  · Exercise regularly as told by your health care provider. This may include:  ? 150 minutes of moderate-intensity or vigorous-intensity exercise each week. This could be brisk walking, biking, or water aerobics.  ? Stretching and doing strength exercises, such as yoga or weightlifting, at least 2 times a week.  · Take medicines as told by your health care provider.  · Do not use any products that contain nicotine or tobacco, such as cigarettes and e-cigarettes. If you need help quitting, ask your health care provider.  · Work with a counselor or diabetes educator to identify strategies to manage stress and any emotional and social challenges.  Questions to ask a health care provider  · Do I need to meet with a diabetes educator?  · Do I need to meet with a dietitian?  · What number can I call if I have questions?  · When are the best times to check my blood glucose?  Where to find more  information:  · American Diabetes Association: diabetes.org  · Academy of Nutrition and Dietetics: www.eatright.org  · National Institute of Diabetes and Digestive and Kidney Diseases (NIH): www.niddk.nih.gov  Summary  · A healthy meal plan will help you control your blood glucose and maintain a healthy lifestyle.  · Working with a diet and nutrition specialist (dietitian) can help you make a meal plan that is best for you.  · Keep in mind that carbohydrates (carbs) and alcohol have immediate effects on your blood glucose levels. It is important to count carbs and to use alcohol carefully.  This information is not intended to   replace advice given to you by your health care provider. Make sure you discuss any questions you have with your health care provider.  Document Released: 07/16/2005 Document Revised: 05/19/2017 Document Reviewed: 11/23/2016  Elsevier Interactive Patient Education © 2019 [employer] Inc.

## 2018-11-09 NOTE — Progress Notes (Signed)
Subjective:  Patient ID: Jesse Wilcox, male    DOB: [birth year]  Age: 68 y.o. MRN: [id number]  CC: Diabetes   HPI Jesse Wilcox is a 68 year old male with a history of uncontrolled type 2 diabetes mellitus (A1c 7.3), hypertension, hyperlipidemia, hepatitis C who comes in for a follow-up visit. A1c is 9.7 which has trended up from 7.3 previously and he endorses noncompliance with a diabetic diet and he sometimes forgets to take his Lantus at nighttime.  Denies visual concerns, numbness in extremities or hypoglycemia. I have reviewed his glucometer and his 30-day average is 178, 14-day average is 190 and 7-day average is 202 He is doing well on his antihypertensive and his statin and denies myalgias. He recently had a colonoscopy which revealed polyps and pathology was positive for tubular adenoma, no high-grade dysplasia.  Past Medical History:  Diagnosis Date  . Carotid artery occlusion   . Chronic hepatitis C ([place])   . Diabetes mellitus     Past Surgical History:  Procedure Laterality Date  . Aortic arch angiogram  04-15-12   By Dr. Einar Gip  . ARCH AORTOGRAM  04/15/2012   Procedure: ARCH AORTOGRAM;  Surgeon: Laverda Page, MD;  Location[facility] CATH LAB;  Service: Cardiovascular;;  . CAROTID ANGIOGRAM N/A 04/15/2012   Procedure: CAROTID ANGIOGRAM;  Surgeon: Laverda Page, MD;  Location: [facility] CATH LAB;  Service: Cardiovascular;  Laterality: N/A;  . CAROTID STENT INSERTION N/A 08/23/2012   Procedure: CAROTID STENT INSERTION;  Surgeon: Elam Dutch, MD;  Location: [facility] CATH LAB;  Service: Cardiovascular;  Laterality: N/A;    No Known Allergies   Outpatient Medications Prior to Visit  Medication Sig Dispense Refill  . aspirin EC 81 MG tablet Take 1 tablet (81 mg total) by mouth daily. 30 tablet 3  . B-D ULTRAFINE III SHORT PEN 31G X 8 MM MISC USE AS DIRECTED 4 TIMES DAILY AFTER MEALS AND AT BEDTIME 100 each 11  . baclofen (LIORESAL) 10 MG tablet Take 1 tablet (10 mg  total) by mouth 3 (three) times daily as needed for muscle spasms. 30 each 0  . Blood Glucose Monitoring Suppl (TRUE METRIX METER) w/Device KIT Use to check blood sugar as directed 1 kit 0  . glucose blood test strip Use as instructed 100 each 12  . Miconazole POWD Applied to area of irritation around the foreskin twice daily 1 Bottle 2  . Multiple Vitamin (MULTIVITAMIN WITH MINERALS) TABS Take 1 tablet by mouth daily.    . polyethylene glycol-electrolytes (NULYTELY/GOLYTELY) 420 g solution Take 4,000 mLs by mouth as directed. 4000 mL 0  . TRUEPLUS LANCETS 28G MISC Use as directed 100 each 12  . amLODipine (NORVASC) 10 MG tablet Take 1 tablet (10 mg total) by mouth daily. 30 tablet 3  . atorvastatin (LIPITOR) 20 MG tablet Take 1 tablet (20 mg total) by mouth daily. 30 tablet 3  . Insulin Glargine (LANTUS) 100 UNIT/ML Solostar Pen Inject 55 Units into the skin daily. 6 pen 3  . lisinopril-hydrochlorothiazide (PRINZIDE,ZESTORETIC) 20-12.5 MG tablet Take 1 tablet by mouth daily. 30 tablet 3  . metFORMIN (GLUCOPHAGE) 500 MG tablet TAKE 2 TABLETS BY MOUTH 2 TIMES DAILY WITH A MEAL 120 tablet 3  . traZODone (DESYREL) 50 MG tablet Take 1 tablet (50 mg total) by mouth at bedtime as needed for sleep. 30 tablet 3  . clopidogrel (PLAVIX) 75 MG tablet Take 1 tablet (75 mg total) by mouth daily. (Patient not taking: Reported  on 11/09/2018) 30 tablet 3  . docusate sodium (COLACE) 100 MG capsule Take 1 capsule (100 mg total) by mouth every 12 (twelve) hours. (Patient not taking: Reported on 09/28/2018) 20 capsule 0  . HYDROcodone-acetaminophen (NORCO) 5-325 MG tablet Take 1-2 tablets by mouth every 6 (six) hours as needed for severe pain. (Patient not taking: Reported on 11/09/2018) 6 tablet 0   No facility-administered medications prior to visit.     ROS Review of Systems  Constitutional: Negative for activity change and appetite change.  HENT: Negative for sinus pressure and sore throat.   Eyes: Negative for  visual disturbance.  Respiratory: Negative for cough, chest tightness and shortness of breath.   Cardiovascular: Negative for chest pain and leg swelling.  Gastrointestinal: Negative for abdominal distention, abdominal pain, constipation and diarrhea.  Endocrine: Negative.   Genitourinary: Negative for dysuria.  Musculoskeletal: Negative for joint swelling and myalgias.  Skin: Negative for rash.  Allergic/Immunologic: Negative.   Neurological: Negative for weakness, light-headedness and numbness.  Psychiatric/Behavioral: Negative for dysphoric mood and suicidal ideas.    Objective:  BP (!) 147/78   Pulse 86   Temp 97.7 F (36.5 C) (Oral)   Ht '5\' 11"'$  (1.803 m)   Wt 210 lb 9.6 oz (95.5 kg)   SpO2 97%   BMI 29.37 kg/m   BP/Weight 11/09/2018 09/28/2018 68/01/4195  Systolic BP 222 979 892  Diastolic BP 78 66 72  Wt. (Lbs) 210.6 205 205  BMI 29.37 28.59 28.59  Some encounter information is confidential and restricted. Go to Review Flowsheets activity to see all data.      Physical Exam Constitutional:      Appearance: He is well-developed.  Cardiovascular:     Rate and Rhythm: Normal rate.     Heart sounds: Normal heart sounds. No murmur.  Pulmonary:     Effort: Pulmonary effort is normal.     Breath sounds: Normal breath sounds. No wheezing or rales.  Chest:     Chest wall: No tenderness.  Abdominal:     General: Bowel sounds are normal. There is no distension.     Palpations: Abdomen is soft. There is no mass.     Tenderness: There is no abdominal tenderness.  Musculoskeletal: Normal range of motion.  Neurological:     Mental Status: He is alert and oriented to person, place, and time.  Psychiatric:        Mood and Affect: Mood normal.        Behavior: Behavior normal.     CMP Latest Ref Rng & Units 03/11/2018 11/09/2017 05/21/2017  Glucose 65 - 99 mg/dL 111(H) 103(H) 132(H)  BUN 8 - 27 mg/dL '10 10 10  '$ Creatinine 0.76 - 1.27 mg/dL 0.82 0.84 0.93  Sodium 134 - 144  mmol/L 138 143 139  Potassium 3.5 - 5.2 mmol/L 4.5 4.5 4.4  Chloride 96 - 106 mmol/L 101 102 101  CO2 20 - 29 mmol/L '25 26 24  '$ Calcium 8.6 - 10.2 mg/dL 9.4 9.9 9.6  Total Protein 6.0 - 8.5 g/dL 7.2 7.5 7.6  Total Bilirubin 0.0 - 1.2 mg/dL 0.7 0.9 0.5  Alkaline Phos 39 - 117 IU/L 96 106 102  AST 0 - 40 IU/L 24 20 40  ALT 0 - 44 IU/L 41 41 58(H)    Lipid Panel     Component Value Date/Time   CHOL 143 03/11/2018 0936   TRIG 138 03/11/2018 0936   HDL 32 (L) 03/11/2018 0936   CHOLHDL 4.5 03/11/2018  0936   CHOLHDL 4.1 04/24/2016 1033   VLDL 38 (H) 04/24/2016 1033   LDLCALC 83 03/11/2018 0936   LDLDIRECT 140.6 08/08/2014 1024    Lab Results  Component Value Date   HGBA1C 9.7 (A) 11/09/2018    Assessment & Plan:   1. Type 2 diabetes mellitus with other specified complication, with long-term current use of insulin (HCC) Uncontrolled with A1c of 9.7 which has trended up from 7.3 previously Increased dose of Lantus Emphasized the need to be compliant with medications and diet Discussed strategies to improve compliance - POCT glucose (manual entry) - POCT glycosylated hemoglobin (Hb A1C) - Insulin Glargine (LANTUS) 100 UNIT/ML Solostar Pen; Inject 60 Units into the skin daily.  Dispense: 6 pen; Refill: 3 - CMP14+EGFR - Lipid panel - Microalbumin/Creatinine Ratio, Urine - metFORMIN (GLUCOPHAGE) 500 MG tablet; TAKE 2 TABLETS BY MOUTH 2 TIMES DAILY WITH A MEAL  Dispense: 120 tablet; Refill: 3  2. Essential hypertension Mildly elevated No regimen change today Counseled on blood pressure goal of less than 130/80, low-sodium, DASH diet, medication compliance, 150 minutes of moderate intensity exercise per week. Discussed medication compliance, adverse effects. - amLODipine (NORVASC) 10 MG tablet; Take 1 tablet (10 mg total) by mouth daily.  Dispense: 30 tablet; Refill: 3 - lisinopril-hydrochlorothiazide (PRINZIDE,ZESTORETIC) 20-12.5 MG tablet; Take 1 tablet by mouth daily.  Dispense:  30 tablet; Refill: 3  3. Pure hypercholesterolemia Controlled Low-cholesterol diet - atorvastatin (LIPITOR) 20 MG tablet; Take 1 tablet (20 mg total) by mouth daily.  Dispense: 30 tablet; Refill: 3  4. Other insomnia Controlled - traZODone (DESYREL) 50 MG tablet; Take 1 tablet (50 mg total) by mouth at bedtime as needed for sleep.  Dispense: 30 tablet; Refill: 3   Meds ordered this encounter  Medications  . Insulin Glargine (LANTUS) 100 UNIT/ML Solostar Pen    Sig: Inject 60 Units into the skin daily.    Dispense:  6 pen    Refill:  3  . amLODipine (NORVASC) 10 MG tablet    Sig: Take 1 tablet (10 mg total) by mouth daily.    Dispense:  30 tablet    Refill:  3  . atorvastatin (LIPITOR) 20 MG tablet    Sig: Take 1 tablet (20 mg total) by mouth daily.    Dispense:  30 tablet    Refill:  3  . lisinopril-hydrochlorothiazide (PRINZIDE,ZESTORETIC) 20-12.5 MG tablet    Sig: Take 1 tablet by mouth daily.    Dispense:  30 tablet    Refill:  3  . metFORMIN (GLUCOPHAGE) 500 MG tablet    Sig: TAKE 2 TABLETS BY MOUTH 2 TIMES DAILY WITH A MEAL    Dispense:  120 tablet    Refill:  3  . traZODone (DESYREL) 50 MG tablet    Sig: Take 1 tablet (50 mg total) by mouth at bedtime as needed for sleep.    Dispense:  30 tablet    Refill:  3  . Tdap (BOOSTRIX) 5-2.5-18.5 LF-MCG/0.5 injection    Sig: Inject 0.5 mLs into the muscle once for 1 dose.    Dispense:  0.5 mL    Refill:  0    Follow-up: Return in about 3 months (around 02/08/2019) for Follow-up of chronic medical conditions.   Charlott Rakes MD

## 2018-11-10 ENCOUNTER — Telehealth: Payer: Self-pay

## 2018-11-10 LAB — CMP14+EGFR
ALBUMIN: 4.5 g/dL (ref 3.6–4.8)
ALT: 43 IU/L (ref 0–44)
AST: 20 IU/L (ref 0–40)
Albumin/Globulin Ratio: 1.5 (ref 1.2–2.2)
Alkaline Phosphatase: 116 IU/L (ref 39–117)
BUN/Creatinine Ratio: 12 (ref 10–24)
BUN: 11 mg/dL (ref 8–27)
Bilirubin Total: 0.6 mg/dL (ref 0.0–1.2)
CO2: 23 mmol/L (ref 20–29)
CREATININE: 0.92 mg/dL (ref 0.76–1.27)
Calcium: 9.8 mg/dL (ref 8.6–10.2)
Chloride: 98 mmol/L (ref 96–106)
GFR calc Af Amer: 100 mL/min/{1.73_m2} (ref >59)
GFR, EST NON AFRICAN AMERICAN: 86 mL/min/{1.73_m2} (ref >59)
Globulin, Total: 3.1 g/dL (ref 1.5–4.5)
Glucose: 196 mg/dL — ABNORMAL HIGH (ref 65–99)
Potassium: 4.3 mmol/L (ref 3.5–5.2)
Sodium: 137 mmol/L (ref 134–144)
Total Protein: 7.6 g/dL (ref 6.0–8.5)

## 2018-11-10 LAB — MICROALBUMIN / CREATININE URINE RATIO
CREATININE, UR: 102.5 mg/dL
Microalb/Creat Ratio: 8.2 mg/g creat (ref 0.0–30.0)
Microalbumin, Urine: 8.4 ug/mL

## 2018-11-10 LAB — LIPID PANEL
CHOL/HDL RATIO: 4.6 ratio (ref 0.0–5.0)
Cholesterol, Total: 148 mg/dL (ref 100–199)
HDL: 32 mg/dL — ABNORMAL LOW (ref >39)
LDL Calculated: 84 mg/dL (ref 0–99)
Triglycerides: 162 mg/dL — ABNORMAL HIGH (ref 0–149)
VLDL Cholesterol Cal: 32 mg/dL (ref 5–40)

## 2018-11-10 NOTE — Telephone Encounter (Signed)
Patient returned phone call and was informed of lab results. 

## 2018-11-10 NOTE — Telephone Encounter (Signed)
-----   Message from Charlott Rakes, MD sent at 11/10/2018  2:30 PM EST ----- Labs are stable

## 2018-11-10 NOTE — Telephone Encounter (Signed)
Patient was called and a voicemail was left informing patient to return phone call for lab results. 

## 2018-12-08 MED FILL — LISINOPRIL-HCTZ 20-12.5 MG: 20-12.5 | 30 days supply | Qty: 30 | Fill #3

## 2018-12-20 MED FILL — ATORVASTATIN 20 MG TABLET: 20 | 30 days supply | Qty: 30 | Fill #1

## 2018-12-20 MED FILL — AMLODIPINE BESYLATE 10 MG T: 10 | 30 days supply | Qty: 30 | Fill #1

## 2019-01-02 ENCOUNTER — Other Ambulatory Visit: Payer: Self-pay | Admitting: Family Medicine

## 2019-01-02 DIAGNOSIS — E119 Type 2 diabetes mellitus without complications: Secondary | ICD-10-CM

## 2019-01-02 MED FILL — !LANTUS SOLOSTAR 100UNITS/M: 100 | 20 days supply | Qty: 12 | Fill #1

## 2019-01-02 MED FILL — TRUEplus 5-BEVEL PEN NEEDLE: 31G X 8 MM | 25 days supply | Qty: 100 | Fill #0

## 2019-01-18 MED FILL — LISINOPRIL-HCTZ 20-12.5 MG: 20-12.5 | 30 days supply | Qty: 30 | Fill #0

## 2019-02-01 ENCOUNTER — Encounter: Payer: Self-pay | Admitting: Family Medicine

## 2019-02-01 ENCOUNTER — Ambulatory Visit: Payer: Medicare Other | Attending: Family Medicine | Admitting: Family Medicine

## 2019-02-01 ENCOUNTER — Other Ambulatory Visit: Payer: Self-pay

## 2019-02-01 DIAGNOSIS — Z794 Long term (current) use of insulin: Secondary | ICD-10-CM | POA: Diagnosis not present

## 2019-02-01 DIAGNOSIS — G4709 Other insomnia: Secondary | ICD-10-CM

## 2019-02-01 DIAGNOSIS — I10 Essential (primary) hypertension: Secondary | ICD-10-CM

## 2019-02-01 DIAGNOSIS — E1169 Type 2 diabetes mellitus with other specified complication: Secondary | ICD-10-CM

## 2019-02-01 DIAGNOSIS — E7800 Pure hypercholesterolemia, unspecified: Secondary | ICD-10-CM

## 2019-02-01 MED ORDER — TRAZODONE HCL 50 MG PO TABS: 50.0000 mg | ORAL_TABLET | Freq: Every evening | ORAL | 3 refills | Status: DC | PRN

## 2019-02-01 MED ORDER — ATORVASTATIN CALCIUM 20 MG PO TABS: 20.0000 mg | ORAL_TABLET | Freq: Every day | ORAL | 3 refills | Status: DC

## 2019-02-01 MED ORDER — AMLODIPINE BESYLATE 10 MG PO TABS: 10.0000 mg | ORAL_TABLET | Freq: Every day | ORAL | 3 refills | Status: DC

## 2019-02-01 MED ORDER — LISINOPRIL-HYDROCHLOROTHIAZIDE 20-12.5 MG PO TABS: 1.0000 | ORAL_TABLET | Freq: Every day | ORAL | 3 refills | Status: DC

## 2019-02-01 MED ORDER — INSULIN GLARGINE 100 UNIT/ML SOLOSTAR PEN: 65.0000 [IU] | PEN_INJECTOR | Freq: Every day | SUBCUTANEOUS | 3 refills | Status: DC

## 2019-02-01 MED ORDER — METFORMIN HCL 500 MG PO TABS: ORAL_TABLET | ORAL | 3 refills | Status: DC

## 2019-02-01 MED FILL — traZODone HCL 50 MG TABS: 50 | 30 days supply | Qty: 30 | Fill #0

## 2019-02-01 MED FILL — ATORVASTATIN 20 MG TABLET: 20 | 30 days supply | Qty: 30 | Fill #2

## 2019-02-01 MED FILL — $LANTUS SOLOSTAR 100 UNITS/: 100 | 90 days supply | Qty: 54 | Fill #0

## 2019-02-01 MED FILL — LISINOPRIL-HCTZ 20-12.5 MG: 20-12.5 | 30 days supply | Qty: 30 | Fill #1

## 2019-02-01 MED FILL — AMLODIPINE BESYLATE 10 MG T: 10 | 30 days supply | Qty: 30 | Fill #2

## 2019-02-01 MED FILL — metFORMIN HCL 500 MG TABS: 500 | 30 days supply | Qty: 120 | Fill #1

## 2019-02-01 NOTE — Progress Notes (Signed)
Patient has been called and DOB has been verified. Patient has been screened and transferred to PCP to start phone visit.  C/C: Diabetes  Refills: lantus,lisinopril,amlodipine,trazadone,lipitor.

## 2019-02-01 NOTE — Progress Notes (Signed)
Virtual Visit via Telephone Note  I connected with Jesse Wilcox on 02/01/19 at  8:30 AM EDT by telephone and verified that I am speaking with the correct person using two identifiers.   I discussed the limitations, risks, security and privacy concerns of performing an evaluation and management service by telephone and the availability of in person appointments. I also discussed with the patient that there may be a patient responsible charge related to this service. The patient expressed understanding and agreed to proceed.   History of Present Illness: is a 68 year old male with a history of uncontrolled type 2 diabetes mellitus (A1c 9.7 from 11/2018), hypertension, hyperlipidemia, hepatitis C who comes in for a follow-up visit.  His fasting sugars have ranged between 148 and 182 and he has been taking 60 units of Lantus at bedtime but not compliant with a diabetic diet or exercise. He has not been checking his blood pressures. Denies hypoglycemia, visual concerns or numbness in extremities but notices that when he inhales he has some chest discomfort but no wheezing or dyspnea and this is absent on exertion.  Symptoms started rare.  He does not exercise and has been very sedentary and informs me he only takes a walk to the car. Symptoms are absent at this time.  Observations/Objective: Awake, alert, oriented x3 No acute distress  Assessment and Plan: 1. Type 2 diabetes mellitus with other specified complication, with long-term current use of insulin (HCC) Uncontrolled with A1c of 9.7 Fasting sugars are still elevated Increase Lantus to 65 units at bedtime Counseled on Diabetic diet, my plate method, 496 minutes of moderate intensity exercise/week Keep blood sugar logs with fasting goals of 80-120 mg/dl, random of less than 180 and in the event of sugars less than 60 mg/dl or greater than 400 mg/dl please notify the clinic ASAP. It is recommended that you undergo annual eye exams and  annual foot exams. Pneumonia vaccine is recommended. - metFORMIN (GLUCOPHAGE) 500 MG tablet; TAKE 2 TABLETS BY MOUTH 2 TIMES DAILY WITH A MEAL  Dispense: 120 tablet; Refill: 3 - Insulin Glargine (LANTUS) 100 UNIT/ML Solostar Pen; Inject 65 Units into the skin daily.  Dispense: 6 pen; Refill: 3  2. Other insomnia Stable - traZODone (DESYREL) 50 MG tablet; Take 1 tablet (50 mg total) by mouth at bedtime as needed for sleep.  Dispense: 30 tablet; Refill: 3  3. Essential hypertension Controlled Counseled on blood pressure goal of less than 130/80, low-sodium, DASH diet, medication compliance, 150 minutes of moderate intensity exercise per week. Discussed medication compliance, adverse effects. - lisinopril-hydrochlorothiazide (PRINZIDE,ZESTORETIC) 20-12.5 MG tablet; Take 1 tablet by mouth daily.  Dispense: 30 tablet; Refill: 3 - amLODipine (NORVASC) 10 MG tablet; Take 1 tablet (10 mg total) by mouth daily.  Dispense: 30 tablet; Refill: 3  4. Pure hypercholesterolemia Stable - atorvastatin (LIPITOR) 20 MG tablet; Take 1 tablet (20 mg total) by mouth daily.  Dispense: 30 tablet; Refill: 3   Follow Up Instructions: Advised to take walks around the house to get in some exercise.  If symptoms persist he may benefit from an MDI but has been informed to notify the clinic if that occurs. We will need labs at next visit.   I discussed the assessment and treatment plan with the patient. The patient was provided an opportunity to ask questions and all were answered. The patient agreed with the plan and demonstrated an understanding of the instructions.   The patient was advised to call back or seek  an in-person evaluation if the symptoms worsen or if the condition fails to improve as anticipated.  I provided 9 minutes of non-face-to-face time during this encounter.   Charlott Rakes, MD

## 2019-03-08 MED FILL — LISINOPRIL-HCTZ 20-12.5 MG: 20-12.5 | 30 days supply | Qty: 30 | Fill #0

## 2019-03-08 MED FILL — ?ATORVASTATIN 20 MG TABLET: 20 | 90 days supply | Qty: 90 | Fill #0

## 2019-03-08 MED FILL — ?AMLODIPINE BESYLATE 10 MG: 10 | 90 days supply | Qty: 90 | Fill #0

## 2019-03-08 MED FILL — traZODone HCL 50 MG TABS: 50 | 30 days supply | Qty: 30 | Fill #0

## 2019-04-13 ENCOUNTER — Telehealth: Payer: Self-pay | Admitting: Family Medicine

## 2019-04-13 NOTE — Telephone Encounter (Signed)
Pt was call ed to be remind that he need to bring the application and the paperwork that he need to apply for CAFa and OC, the dateline is today

## 2019-04-17 ENCOUNTER — Other Ambulatory Visit: Payer: Self-pay

## 2019-04-17 ENCOUNTER — Ambulatory Visit: Payer: Self-pay | Attending: Family Medicine

## 2019-05-01 MED FILL — LISINOPRIL-HCTZ 20-12.5 MG: 20-12.5 | 30 days supply | Qty: 30 | Fill #1

## 2019-05-01 MED FILL — metFORMIN HCL 500 MG TABS: 500 | 30 days supply | Qty: 120 | Fill #0

## 2019-05-08 MED FILL — TRUEPLUS PEN NDL 31GX5/16: 31G X 8 MM | 25 days supply | Qty: 100 | Fill #1

## 2019-05-08 MED FILL — TRUE METRIX TEST STRIP: 30 days supply | Qty: 100 | Fill #1

## 2019-05-08 MED FILL — TRUEPLUS PEN NDL 31GX5/16": 31G X 8 MM | 25 days supply | Qty: 100 | Fill #1

## 2019-06-05 MED FILL — AMLODIPINE BESYLATE 10 MG T: 10 | 30 days supply | Qty: 30 | Fill #1

## 2019-06-05 MED FILL — $LANTUS SOLOSTAR 100 UNITS/: 100 | 29 days supply | Qty: 18 | Fill #1

## 2019-06-05 MED FILL — ATORVASTATIN 20 MG TABLET: 20 | 30 days supply | Qty: 30 | Fill #1

## 2019-06-05 MED FILL — LISINOPRIL-HCTZ 20-12.5 MG: 20-12.5 | 30 days supply | Qty: 30 | Fill #2

## 2019-06-16 ENCOUNTER — Ambulatory Visit (INDEPENDENT_AMBULATORY_CARE_PROVIDER_SITE_OTHER): Payer: Medicare Other | Admitting: Podiatry

## 2019-06-16 ENCOUNTER — Other Ambulatory Visit: Payer: Self-pay

## 2019-06-16 ENCOUNTER — Encounter: Payer: Self-pay | Admitting: Podiatry

## 2019-06-16 DIAGNOSIS — M79675 Pain in left toe(s): Secondary | ICD-10-CM | POA: Diagnosis not present

## 2019-06-16 DIAGNOSIS — M79674 Pain in right toe(s): Secondary | ICD-10-CM | POA: Diagnosis not present

## 2019-06-16 DIAGNOSIS — Z794 Long term (current) use of insulin: Secondary | ICD-10-CM

## 2019-06-16 DIAGNOSIS — B351 Tinea unguium: Secondary | ICD-10-CM | POA: Insufficient documentation

## 2019-06-16 DIAGNOSIS — E1100 Type 2 diabetes mellitus with hyperosmolarity without nonketotic hyperglycemic-hyperosmolar coma (NKHHC): Secondary | ICD-10-CM

## 2019-06-16 NOTE — Progress Notes (Signed)
Complaint:  Visit Type: Patient returns to my office for continued preventative foot care services. Complaint: Patient states" my nails have grown long and thick and become painful to walk and wear shoes" Patient has been diagnosed with DM with no foot complications. The patient presents for preventative foot care services. No changes to ROS.  Patient has not been seen in  Over  2 years.  Podiatric Exam: Vascular: dorsalis pedis and posterior tibial pulses are palpable bilateral. Capillary return is immediate. Temperature gradient is WNL. Skin turgor WNL  Sensorium: Normal Semmes Weinstein monofilament test. Normal tactile sensation bilaterally. Nail Exam: Pt has thick disfigured discolored nails with subungual debris noted bilateral entire nail hallux through fifth toenails especially left hallux toenail. Ulcer Exam: There is no evidence of ulcer or pre-ulcerative changes or infection. Orthopedic Exam: Muscle tone and strength are WNL. No limitations in general ROM. No crepitus or effusions noted. Foot type and digits show no abnormalities. Bony prominences are unremarkable. Skin: No Porokeratosis. No infection or ulcers  Diagnosis:  Onychomycosis, , Pain in right toe, pain in left toes  Treatment & Plan Procedures and Treatment: Consent by patient was obtained for treatment procedures.   Debridement of mycotic and hypertrophic toenails, 1 through 5 bilateral and clearing of subungual debris. No ulceration, no infection noted.  Return Visit-Office Procedure: Patient instructed to return to the office for a follow up visit 3 months for continued evaluation and treatment.    Gardiner Barefoot DPM

## 2019-06-28 ENCOUNTER — Ambulatory Visit: Payer: Medicare Other | Attending: Family Medicine | Admitting: Family Medicine

## 2019-06-28 ENCOUNTER — Other Ambulatory Visit: Payer: Self-pay

## 2019-06-28 ENCOUNTER — Encounter: Payer: Self-pay | Admitting: Family Medicine

## 2019-06-28 DIAGNOSIS — Z79899 Other long term (current) drug therapy: Secondary | ICD-10-CM | POA: Insufficient documentation

## 2019-06-28 DIAGNOSIS — Z716 Tobacco abuse counseling: Secondary | ICD-10-CM | POA: Insufficient documentation

## 2019-06-28 DIAGNOSIS — E7800 Pure hypercholesterolemia, unspecified: Secondary | ICD-10-CM

## 2019-06-28 DIAGNOSIS — E1169 Type 2 diabetes mellitus with other specified complication: Secondary | ICD-10-CM | POA: Diagnosis not present

## 2019-06-28 DIAGNOSIS — Z794 Long term (current) use of insulin: Secondary | ICD-10-CM | POA: Diagnosis not present

## 2019-06-28 DIAGNOSIS — Z720 Tobacco use: Secondary | ICD-10-CM | POA: Diagnosis not present

## 2019-06-28 DIAGNOSIS — I10 Essential (primary) hypertension: Secondary | ICD-10-CM

## 2019-06-28 DIAGNOSIS — B182 Chronic viral hepatitis C: Secondary | ICD-10-CM | POA: Insufficient documentation

## 2019-06-28 DIAGNOSIS — F17200 Nicotine dependence, unspecified, uncomplicated: Secondary | ICD-10-CM | POA: Diagnosis not present

## 2019-06-28 DIAGNOSIS — E785 Hyperlipidemia, unspecified: Secondary | ICD-10-CM | POA: Insufficient documentation

## 2019-06-28 DIAGNOSIS — Z7982 Long term (current) use of aspirin: Secondary | ICD-10-CM | POA: Insufficient documentation

## 2019-06-28 DIAGNOSIS — Z1159 Encounter for screening for other viral diseases: Secondary | ICD-10-CM

## 2019-06-28 MED ORDER — LISINOPRIL-HYDROCHLOROTHIAZIDE 20-12.5 MG PO TABS: 1.0000 | ORAL_TABLET | Freq: Every day | ORAL | 3 refills | Status: DC

## 2019-06-28 MED ORDER — AMLODIPINE BESYLATE 10 MG PO TABS: 10.0000 mg | ORAL_TABLET | Freq: Every day | ORAL | 3 refills | Status: DC

## 2019-06-28 MED ORDER — METFORMIN HCL 500 MG PO TABS: ORAL_TABLET | ORAL | 3 refills | Status: DC

## 2019-06-28 MED ORDER — ATORVASTATIN CALCIUM 20 MG PO TABS: 20.0000 mg | ORAL_TABLET | Freq: Every day | ORAL | 3 refills | Status: DC

## 2019-06-28 MED FILL — metFORMIN HCL 500 MG TABS: 500 | 30 days supply | Qty: 120 | Fill #0

## 2019-06-28 NOTE — Progress Notes (Signed)
Patient has been called and DOB has been verified. Patient has been screened and transferred to PCP to start phone visit.     

## 2019-06-28 NOTE — Progress Notes (Signed)
Virtual Visit via Telephone Note  I connected with Jesse Wilcox, on 06/28/2019 at 9:39 AM by telephone due to the COVID-19 pandemic and verified that I am speaking with the correct person using two identifiers.   Consent: I discussed the limitations, risks, security and privacy concerns of performing an evaluation and management service by telephone and the availability of in person appointments. I also discussed with the patient that there may be a patient responsible charge related to this service. The patient expressed understanding and agreed to proceed.   Location of Patient: In Environmental education officer of Provider: Clinic   Persons participating in Telemedicine visit: Antario Yasuda Farrington-CMA Dr. Felecia Shelling     History of Present Illness: Jesse Wilcox is a 68 year old male with a history of uncontrolled type 2 diabetes mellitus (A1c 9.7 from 11/2018), hypertension, hyperlipidemia, hepatitis C who comes in for a follow-up visit.  His fasting sugars have ranged between 140 and 177 but he had a 76 yesterday.  He endorses noncompliance with a diabetic diet and eats lots of junk foods.  Last eye exam was last year and he recently had a podiatry exam.  He denies hypoglycemia, neuropathy symptoms, visual concerns. He has not been checking his blood pressures at home as he has no glucometer. Compliant with his statin and denies adverse effects from his medications. Does not exercise regularly but plans to commence an exercise regimen. He requests a SARS-CoV-2 test; he is not symptomatic and has had no known sick contacts.  Past Medical History:  Diagnosis Date  . Carotid artery occlusion   . Chronic hepatitis C ([place])   . Diabetes mellitus    No Known Allergies  Current Outpatient Medications on File Prior to Visit  Medication Sig Dispense Refill  . amLODipine (NORVASC) 10 MG tablet Take 1 tablet (10 mg total) by mouth daily. 30 tablet 3  . aspirin EC 81 MG tablet  Take 1 tablet (81 mg total) by mouth daily. 30 tablet 3  . atorvastatin (LIPITOR) 20 MG tablet Take 1 tablet (20 mg total) by mouth daily. 30 tablet 3  . Insulin Glargine (LANTUS) 100 UNIT/ML Solostar Pen Inject 65 Units into the skin daily. 6 pen 3  . lisinopril-hydrochlorothiazide (PRINZIDE,ZESTORETIC) 20-12.5 MG tablet Take 1 tablet by mouth daily. 30 tablet 3  . metFORMIN (GLUCOPHAGE) 500 MG tablet TAKE 2 TABLETS BY MOUTH 2 TIMES DAILY WITH A MEAL 120 tablet 3  . baclofen (LIORESAL) 10 MG tablet Take 1 tablet (10 mg total) by mouth 3 (three) times daily as needed for muscle spasms. (Patient not taking: Reported on 06/28/2019) 30 each 0  . Blood Glucose Monitoring Suppl (TRUE METRIX METER) w/Device KIT Use to check blood sugar as directed (Patient not taking: Reported on 06/28/2019) 1 kit 0  . docusate sodium (COLACE) 100 MG capsule Take 1 capsule (100 mg total) by mouth every 12 (twelve) hours. (Patient not taking: Reported on 06/28/2019) 20 capsule 0  . glucose blood test strip Use as instructed (Patient not taking: Reported on 06/28/2019) 100 each 12  . HYDROcodone-acetaminophen (NORCO) 5-325 MG tablet Take 1-2 tablets by mouth every 6 (six) hours as needed for severe pain. (Patient not taking: Reported on 06/28/2019) 6 tablet 0  . Miconazole POWD Applied to area of irritation around the foreskin twice daily (Patient not taking: Reported on 06/28/2019) 1 Bottle 2  . Multiple Vitamin (MULTIVITAMIN WITH MINERALS) TABS Take 1 tablet by mouth daily.    . polyethylene glycol-electrolytes (  NULYTELY/GOLYTELY) 420 g solution Take 4,000 mLs by mouth as directed. (Patient not taking: Reported on 06/28/2019) 4000 mL 0  . traZODone (DESYREL) 50 MG tablet Take 1 tablet (50 mg total) by mouth at bedtime as needed for sleep. (Patient not taking: Reported on 06/28/2019) 30 tablet 3  . TRUEPLUS 5-BEVEL PEN NEEDLES 31G X 8 MM MISC USE AS DIRECTED 4 TIMES DAILY AFTER MEALS AND AT BEDTIME (Patient not taking: Reported on  06/28/2019) 100 each 2  . TRUEPLUS LANCETS 28G MISC Use as directed (Patient not taking: Reported on 06/28/2019) 100 each 12   No current facility-administered medications on file prior to visit.     Observations/Objective: Awake, alert, oriented x3  Not in acute distress   CMP Latest Ref Rng & Units 11/09/2018 03/11/2018 11/09/2017  Glucose 65 - 99 mg/dL 196(H) 111(H) 103(H)  BUN 8 - 27 mg/dL '11 10 10  '$ Creatinine 0.76 - 1.27 mg/dL 0.92 0.82 0.84  Sodium 134 - 144 mmol/L 137 138 143  Potassium 3.5 - 5.2 mmol/L 4.3 4.5 4.5  Chloride 96 - 106 mmol/L 98 101 102  CO2 20 - 29 mmol/L '23 25 26  '$ Calcium 8.6 - 10.2 mg/dL 9.8 9.4 9.9  Total Protein 6.0 - 8.5 g/dL 7.6 7.2 7.5  Total Bilirubin 0.0 - 1.2 mg/dL 0.6 0.7 0.9  Alkaline Phos 39 - 117 IU/L 116 96 106  AST 0 - 40 IU/L '20 24 20  '$ ALT 0 - 44 IU/L 43 41 41    Lipid Panel     Component Value Date/Time   CHOL 148 11/09/2018 1044   TRIG 162 (H) 11/09/2018 1044   HDL 32 (L) 11/09/2018 1044   CHOLHDL 4.6 11/09/2018 1044   CHOLHDL 4.1 04/24/2016 1033   VLDL 38 (H) 04/24/2016 1033   LDLCALC 84 11/09/2018 1044   LDLDIRECT 140.6 08/08/2014 1024    Lab Results  Component Value Date   HGBA1C 9.7 (A) 11/09/2018    Assessment and Plan: 1. Type 2 diabetes mellitus with other specified complication, with long-term current use of insulin (HCC) Uncontrolled with A1c of 9.7 We will send of A1c and adjust regimen accordingly Advised to schedule eye exam with ophthalmologist; up-to-date on podiatry exam. Counseled on Diabetic diet, my plate method, 767 minutes of moderate intensity exercise/week Keep blood sugar logs with fasting goals of 80-120 mg/dl, random of less than 180 and in the event of sugars less than 60 mg/dl or greater than 400 mg/dl please notify the clinic ASAP. It is recommended that you undergo annual eye exams and annual foot exams. Pneumonia vaccine is recommended. - Hemoglobin A1c; Future - CMP14+EGFR; Future - Lipid panel;  Future - metFORMIN (GLUCOPHAGE) 500 MG tablet; TAKE 2 TABLETS BY MOUTH 2 TIMES DAILY WITH A MEAL  Dispense: 120 tablet; Refill: 3  2. Essential hypertension Controlled Counseled on blood pressure goal of less than 130/80, low-sodium, DASH diet, medication compliance, 150 minutes of moderate intensity exercise per week. Discussed medication compliance, adverse effects. - lisinopril-hydrochlorothiazide (ZESTORETIC) 20-12.5 MG tablet; Take 1 tablet by mouth daily.  Dispense: 30 tablet; Refill: 3 - amLODipine (NORVASC) 10 MG tablet; Take 1 tablet (10 mg total) by mouth daily.  Dispense: 30 tablet; Refill: 3  3. Pure hypercholesterolemia Controlled Low-cholesterol diet - atorvastatin (LIPITOR) 20 MG tablet; Take 1 tablet (20 mg total) by mouth daily.  Dispense: 30 tablet; Refill: 3  4. Screening for viral disease - Novel Coronavirus, NAA ([facility]); Future  5. Tobacco abuse Spent 3 minutes counseling  on cessation and he is not ready to quit at this time    Needs Pneumovax 23 - next visit  Follow Up Instructions: Return in about 3 months (around 09/28/2019) for medical conditions.    I discussed the assessment and treatment plan with the patient. The patient was provided an opportunity to ask questions and all were answered. The patient agreed with the plan and demonstrated an understanding of the instructions.   The patient was advised to call back or seek an in-person evaluation if the symptoms worsen or if the condition fails to improve as anticipated.     I provided 17 minutes total of non-face-to-face time during this encounter including median intraservice time, reviewing previous notes, labs, imaging, medications, management and patient verbalized understanding.     Charlott Rakes, MD, FAAFP. [facility] [place], [place]   06/28/2019, 9:39 AM

## 2019-07-03 ENCOUNTER — Other Ambulatory Visit: Payer: Self-pay | Admitting: Family Medicine

## 2019-07-03 ENCOUNTER — Other Ambulatory Visit: Payer: Self-pay

## 2019-07-03 ENCOUNTER — Ambulatory Visit: Payer: Medicare Other | Attending: Family Medicine

## 2019-07-03 DIAGNOSIS — E1169 Type 2 diabetes mellitus with other specified complication: Secondary | ICD-10-CM

## 2019-07-03 DIAGNOSIS — Z1159 Encounter for screening for other viral diseases: Secondary | ICD-10-CM | POA: Diagnosis not present

## 2019-07-03 DIAGNOSIS — Z23 Encounter for immunization: Secondary | ICD-10-CM

## 2019-07-03 DIAGNOSIS — Z794 Long term (current) use of insulin: Secondary | ICD-10-CM

## 2019-07-04 ENCOUNTER — Other Ambulatory Visit: Payer: Self-pay | Admitting: Family Medicine

## 2019-07-04 DIAGNOSIS — I10 Essential (primary) hypertension: Secondary | ICD-10-CM

## 2019-07-04 LAB — CMP14+EGFR
ALT: 44 IU/L (ref 0–44)
AST: 26 IU/L (ref 0–40)
Albumin/Globulin Ratio: 1.2 (ref 1.2–2.2)
Albumin: 4.1 g/dL (ref 3.8–4.8)
Alkaline Phosphatase: 120 IU/L — ABNORMAL HIGH (ref 39–117)
BUN/Creatinine Ratio: 10 (ref 10–24)
BUN: 9 mg/dL (ref 8–27)
Bilirubin Total: 0.7 mg/dL (ref 0.0–1.2)
CO2: 22 mmol/L (ref 20–29)
Calcium: 9.8 mg/dL (ref 8.6–10.2)
Chloride: 102 mmol/L (ref 96–106)
Creatinine, Ser: 0.91 mg/dL (ref 0.76–1.27)
GFR calc Af Amer: 100 mL/min/{1.73_m2} (ref >59)
GFR calc non Af Amer: 87 mL/min/{1.73_m2} (ref >59)
Globulin, Total: 3.4 g/dL (ref 1.5–4.5)
Glucose: 171 mg/dL — ABNORMAL HIGH (ref 65–99)
Potassium: 4.4 mmol/L (ref 3.5–5.2)
Sodium: 142 mmol/L (ref 134–144)
Total Protein: 7.5 g/dL (ref 6.0–8.5)

## 2019-07-04 LAB — LIPID PANEL
Chol/HDL Ratio: 4.4 ratio (ref 0.0–5.0)
Cholesterol, Total: 131 mg/dL (ref 100–199)
HDL: 30 mg/dL — ABNORMAL LOW (ref >39)
LDL Chol Calc (NIH): 70 mg/dL (ref 0–99)
Triglycerides: 183 mg/dL — ABNORMAL HIGH (ref 0–149)
VLDL Cholesterol Cal: 31 mg/dL (ref 5–40)

## 2019-07-04 LAB — HEMOGLOBIN A1C
Est. average glucose Bld gHb Est-mCnc: 232 mg/dL
Hgb A1c MFr Bld: 9.7 % — ABNORMAL HIGH (ref 4.8–5.6)

## 2019-07-04 MED ORDER — VICTOZA 18 MG/3ML ~~LOC~~ SOPN: PEN_INJECTOR | SUBCUTANEOUS | 3 refills | Status: DC

## 2019-07-05 ENCOUNTER — Telehealth: Payer: Self-pay

## 2019-07-05 LAB — NOVEL CORONAVIRUS, NAA: SARS-CoV-2, NAA: NOT DETECTED

## 2019-07-05 MED FILL — !VICTOZA 18MG/3ML INJECT: 18 | 17 days supply | Qty: 3 | Fill #0

## 2019-07-05 NOTE — Telephone Encounter (Signed)
-----   Message from Charlott Rakes, MD sent at 07/05/2019  1:31 PM EDT ----- He tested negative for COVID-19

## 2019-07-05 NOTE — Telephone Encounter (Signed)
Patient name and DOB has been verified Patient was informed of lab results. Patient had no questions.  

## 2019-07-05 NOTE — Telephone Encounter (Signed)
Patient called stating he had some questions about his results and needed some clarification. Please follow up.

## 2019-07-06 MED FILL — ?traZODONE HCL 50MG TAB: 50 | 30 days supply | Qty: 30 | Fill #1

## 2019-07-06 MED FILL — ?AMLODIPINE BESYLATE 10 MG: 10 | 30 days supply | Qty: 30 | Fill #0

## 2019-07-06 MED FILL — ?METFORMIN HCL 500MG TABLET: 500 | 30 days supply | Qty: 120 | Fill #1

## 2019-07-06 MED FILL — ?ATORVASTATIN 20 MG TABLET: 20 | 30 days supply | Qty: 30 | Fill #0

## 2019-07-06 MED FILL — LISINOPRIL-HCTZ 20-12.5 MG: 20-12.5 | 30 days supply | Qty: 30 | Fill #3

## 2019-07-07 NOTE — Telephone Encounter (Signed)
Patient was called and he was informed of Covid results again and patient has no questions.

## 2019-07-11 ENCOUNTER — Other Ambulatory Visit: Payer: Self-pay

## 2019-07-11 ENCOUNTER — Encounter: Payer: Self-pay | Admitting: Pharmacist

## 2019-07-11 ENCOUNTER — Ambulatory Visit: Payer: Medicare Other | Attending: Family Medicine | Admitting: Pharmacist

## 2019-07-11 DIAGNOSIS — E11649 Type 2 diabetes mellitus with hypoglycemia without coma: Secondary | ICD-10-CM | POA: Diagnosis not present

## 2019-07-11 DIAGNOSIS — Z794 Long term (current) use of insulin: Secondary | ICD-10-CM

## 2019-07-11 DIAGNOSIS — Z7982 Long term (current) use of aspirin: Secondary | ICD-10-CM | POA: Diagnosis not present

## 2019-07-11 DIAGNOSIS — E119 Type 2 diabetes mellitus without complications: Secondary | ICD-10-CM

## 2019-07-11 DIAGNOSIS — I10 Essential (primary) hypertension: Secondary | ICD-10-CM | POA: Insufficient documentation

## 2019-07-11 DIAGNOSIS — F1021 Alcohol dependence, in remission: Secondary | ICD-10-CM | POA: Diagnosis not present

## 2019-07-11 DIAGNOSIS — F17210 Nicotine dependence, cigarettes, uncomplicated: Secondary | ICD-10-CM | POA: Diagnosis not present

## 2019-07-11 DIAGNOSIS — E785 Hyperlipidemia, unspecified: Secondary | ICD-10-CM | POA: Insufficient documentation

## 2019-07-11 DIAGNOSIS — B1920 Unspecified viral hepatitis C without hepatic coma: Secondary | ICD-10-CM | POA: Insufficient documentation

## 2019-07-11 MED ORDER — ATORVASTATIN CALCIUM 40 MG PO TABS: 40.0000 mg | ORAL_TABLET | Freq: Every day | ORAL | 2 refills | Status: DC

## 2019-07-11 NOTE — Patient Instructions (Addendum)
Thank you for coming to see me today. Please do the following:  1. Start Victoza as instructed. Inject 0.6 mg daily for 1 week, then 1.2 mg daily for 1 week, then increase to 1.8 mg daily thereafter.  2. Continue insulin and metformin.  3. Stop atorvastatin 20 mg and start atorvastatin 40 mg daily.  4. Continue checking blood sugars at home.  5. Continue making the lifestyle changes we've discussed together during our visit. Diet and exercise play a significant role in improving your blood sugars.  6. Follow-up with your PCP.   Hypoglycemia or low blood sugar:   Low blood sugar can happen quickly and may become an emergency if not treated right away.   While this shouldn't happen often, it can be brought upon if you skip a meal or do not eat enough. Also, if your insulin or other diabetes medications are dosed too high, this can cause your blood sugar to go to low.   Warning signs of low blood sugar include: 1. Feeling shaky or dizzy 2. Feeling weak or tired  3. Excessive hunger 4. Feeling anxious or upset  5. Sweating even when you aren't exercising  What to do if I experience low blood sugar? 1. Check your blood sugar with your meter. If lower than 70, proceed to step 2.  2. Treat with 3-4 glucose tablets or 3 packets of regular sugar. If these aren't around, you can try hard candy. Yet another option would be to drink 4 ounces of fruit juice or 6 ounces of REGULAR soda.  3. Re-check your sugar in 15 minutes. If it is still below 70, do what you did in step 2 again. If has come back up, go ahead and eat a snack or small meal at this time.

## 2019-07-11 NOTE — Progress Notes (Signed)
S:    PCP: Dr. Margarita Rana No chief complaint on file.  68 YO male with hx of DM, HTN, HLD, Hep C, and carotid artery occlusion.  Patient arrives in good spirits. Presents for diabetes evaluation, education, and management Patient was referred and last seen by Primary Care Provider on 06/28/19. Victoza was added on 07/04/19 after A1c came back at 9.7.   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  Insurance coverage/medication affordability: medicare  Patient reports adherence with medications.  Current diabetes medications include: Lantus 65 units daily, metformin 1000 mg BID, Victoza (has not started - picked up today) Current hypertension medications include: amlodipine 10 mg daily, lisinopril-HCTZ 20-12.5 mg daily Current hyperlipidemia medications include: atorvastatin 20 mg daily   Patient denies hypoglycemic events.  Patient reported dietary habits:  - Hx of non-compliance   Patient-reported exercise habits:  - Reported in the past that he did not exercise   Patient denies polyuria, polydipsia.  Patient denies neuropathy. Patient denies visual changes. Patient reports self foot exams.    O:  POCT:  Home fasting CBG: pt unable to provide range and did not bring glucometer.   Lab Results  Component Value Date   HGBA1C 9.7 (H) 07/03/2019   There were no vitals filed for this visit.  Lipid Panel     Component Value Date/Time   CHOL 131 07/03/2019 0848   TRIG 183 (H) 07/03/2019 0848   HDL 30 (L) 07/03/2019 0848   CHOLHDL 4.4 07/03/2019 0848   CHOLHDL 4.1 04/24/2016 1033   VLDL 38 (H) 04/24/2016 1033   LDLCALC 84 11/09/2018 1044   LDLDIRECT 140.6 08/08/2014 1024   Clinical ASCVD: No  The 10-year ASCVD risk score Mikey Bussing DC Jr., et al., 2013) is: 56.7%   Values used to  calculate the score:     Age: 68 years     Sex: Male     Is Non-Hispanic African American: Yes     Diabetic: Yes     Tobacco smoker: Yes     Systolic Blood Pressure: [id number] mmHg     Is BP treated: Yes     HDL Cholesterol: 30 mg/dL     Total Cholesterol: 131 mg/dL   A/P: Diabetes longstanding currently uncontrolled. Patient is able to verbalize appropriate hypoglycemia management plan. Patient is adherent with medication. Control is suboptimal due to dietary indiscretion and physical inactivity. -Patient was educated on the use of the Victoza pen. Reviewed necessary supplies and operation of the pen. Also reviewed goal blood glucose levels. Patient was able to demonstrate use. All questions and concerns were addressed. -Extensively discussed pathophysiology of DM, recommended lifestyle interventions, dietary effects on glycemic control -Counseled on s/sx of and management of hypoglycemia -Next A1C anticipated end 09/2019.   ASCVD risk - primary prevention in patient with DM. Last LDL 70. ASCVD risk score is >20%  - high intensity statin indicated. Aspirin is indicated.  -Continued aspirin 81 mg  -Increased dose of atorvastatin to 40 mg.   HM: pt is due for pneumonia vaccines. UTD on tetanus and flu vaccines.  - Decline pneumonia shot  Written patient instructions provided.  Total time in face to face counseling 15 minutes.   Follow up w/ PCP.     Patient seen with:  Dillard Essex PharmD  Candidate  Class of 2022 [employer]

## 2019-07-27 MED FILL — !VICTOZA 18MG/3ML INJECT: 18 | 30 days supply | Qty: 9 | Fill #0

## 2019-08-24 ENCOUNTER — Other Ambulatory Visit: Payer: Self-pay | Admitting: Family Medicine

## 2019-08-24 DIAGNOSIS — E1169 Type 2 diabetes mellitus with other specified complication: Secondary | ICD-10-CM

## 2019-08-24 DIAGNOSIS — Z794 Long term (current) use of insulin: Secondary | ICD-10-CM

## 2019-08-24 MED FILL — VICTOZA 18 MG/3 ML INJECT P: 18 | 30 days supply | Qty: 9 | Fill #1

## 2019-08-24 MED FILL — TRUEPLUS PEN NDL 31GX5/16": 31G X 8 MM | 25 days supply | Qty: 100 | Fill #2

## 2019-08-24 MED FILL — TRUEPLUS PEN NDL 31GX5/16: 31G X 8 MM | 25 days supply | Qty: 100 | Fill #2

## 2019-08-24 MED FILL — ?AMLODIPINE BESYLATE 10 MG: 10 | 30 days supply | Qty: 30 | Fill #1

## 2019-08-24 MED FILL — ?traZODONE HCL 50MG TAB: 50 | 30 days supply | Qty: 30 | Fill #1

## 2019-08-24 MED FILL — LISINOPRIL-HCTZ 20-12.5 MG: 20-12.5 | 30 days supply | Qty: 30 | Fill #2

## 2019-08-24 MED FILL — ?ATORVASTATIN 20 MG TABLET: 20 | 30 days supply | Qty: 30 | Fill #1

## 2019-08-24 MED FILL — ?METFORMIN HCL 500MG TABLET: 500 | 30 days supply | Qty: 120 | Fill #2

## 2019-08-25 MED FILL — $LANTUS SOLOSTAR 100 UNITS/: 100 | 27 days supply | Qty: 18 | Fill #0

## 2019-09-22 ENCOUNTER — Ambulatory Visit: Payer: Medicare Other | Admitting: Podiatry

## 2019-09-27 ENCOUNTER — Ambulatory Visit (INDEPENDENT_AMBULATORY_CARE_PROVIDER_SITE_OTHER): Payer: Medicare Other | Admitting: Podiatry

## 2019-09-27 ENCOUNTER — Other Ambulatory Visit: Payer: Self-pay

## 2019-09-27 ENCOUNTER — Encounter: Payer: Self-pay | Admitting: Podiatry

## 2019-09-27 DIAGNOSIS — B351 Tinea unguium: Secondary | ICD-10-CM

## 2019-09-27 DIAGNOSIS — M79675 Pain in left toe(s): Secondary | ICD-10-CM

## 2019-09-27 DIAGNOSIS — M79674 Pain in right toe(s): Secondary | ICD-10-CM | POA: Diagnosis not present

## 2019-09-27 DIAGNOSIS — Z794 Long term (current) use of insulin: Secondary | ICD-10-CM

## 2019-09-27 DIAGNOSIS — E1100 Type 2 diabetes mellitus with hyperosmolarity without nonketotic hyperglycemic-hyperosmolar coma (NKHHC): Secondary | ICD-10-CM | POA: Diagnosis not present

## 2019-09-27 NOTE — Progress Notes (Signed)
Complaint:  Visit Type: Patient returns to my office for continued preventative foot care services. Complaint: Patient states" my nails have grown long and thick and become painful to walk and wear shoes" Patient has been diagnosed with DM with no foot complications. The patient presents for preventative foot care services. No changes to ROS.    Podiatric Exam: Vascular: dorsalis pedis and posterior tibial pulses are palpable bilateral. Capillary return is immediate. Temperature gradient is WNL. Skin turgor WNL  Sensorium: Normal Semmes Weinstein monofilament test. Normal tactile sensation bilaterally. Nail Exam: Pt has thick disfigured discolored nails with subungual debris noted bilateral entire nail hallux through fifth toenails especially left hallux toenail. Ulcer Exam: There is no evidence of ulcer or pre-ulcerative changes or infection. Orthopedic Exam: Muscle tone and strength are WNL. No limitations in general ROM. No crepitus or effusions noted. Foot type and digits show no abnormalities. Bony prominences are unremarkable. Skin: No Porokeratosis. No infection or ulcers  Diagnosis:  Onychomycosis, , Pain in right toe, pain in left toes  Treatment & Plan Procedures and Treatment: Consent by patient was obtained for treatment procedures.   Debridement of mycotic and hypertrophic toenails, 1 through 5 bilateral and clearing of subungual debris. No ulceration, no infection noted.  Return Visit-Office Procedure: Patient instructed to return to the office for a follow up visit 3 months for continued evaluation and treatment.    Gardiner Barefoot DPM

## 2019-10-02 MED FILL — ?AMLODIPINE BESYLATE 10 MG: 10 | 30 days supply | Qty: 30 | Fill #2

## 2019-10-02 MED FILL — LISINOPRIL-HCTZ 20-12.5 MG: 20-12.5 | 30 days supply | Qty: 30 | Fill #3

## 2019-10-02 MED FILL — ?ATORVASTATIN 20 MG TABLET: 20 | 30 days supply | Qty: 30 | Fill #2

## 2019-10-04 ENCOUNTER — Ambulatory Visit: Payer: Medicare Other | Attending: Family Medicine

## 2019-10-04 ENCOUNTER — Other Ambulatory Visit: Payer: Self-pay

## 2019-10-05 ENCOUNTER — Other Ambulatory Visit: Payer: Self-pay

## 2019-10-05 DIAGNOSIS — E119 Type 2 diabetes mellitus without complications: Secondary | ICD-10-CM

## 2019-10-05 MED ORDER — GLUCOSE BLOOD VI STRP: ORAL_STRIP | 12 refills | Status: DC

## 2019-10-05 MED FILL — TRUE METRIX GLUCOSE TEST ST: 30 days supply | Qty: 100 | Fill #0

## 2019-11-13 MED FILL — $LANTUS SOLOSTAR 100 UNITS/: 100 | 27 days supply | Qty: 18 | Fill #1

## 2019-11-13 MED FILL — $VICTOZA 2-PAK 18MG/3ML PEN: 18 | 30 days supply | Qty: 9 | Fill #2

## 2019-11-27 ENCOUNTER — Other Ambulatory Visit: Payer: Self-pay | Admitting: Family Medicine

## 2019-11-27 DIAGNOSIS — E119 Type 2 diabetes mellitus without complications: Secondary | ICD-10-CM

## 2019-11-27 MED FILL — AMLODIPINE BESYLATE 10 MG T: 10 | 30 days supply | Qty: 30 | Fill #3

## 2019-11-27 MED FILL — ?METFORMIN HCL 500MG TABLET: 500 | 30 days supply | Qty: 120 | Fill #3

## 2019-11-27 MED FILL — LISINOPRIL-HCTZ 20-12.5 MG: 20-12.5 | 30 days supply | Qty: 30 | Fill #0

## 2019-11-27 MED FILL — ?ATORVASTATIN 20 MG TABLET: 20 | 30 days supply | Qty: 30 | Fill #3

## 2019-11-28 MED FILL — TRUEPLUS PEN NDL 31GX5/16": 31G X 8 MM | 25 days supply | Qty: 100 | Fill #0

## 2019-11-28 MED FILL — TRUEPLUS PEN NDL 31GX5/16: 31G X 8 MM | 25 days supply | Qty: 100 | Fill #0

## 2019-12-27 ENCOUNTER — Ambulatory Visit (INDEPENDENT_AMBULATORY_CARE_PROVIDER_SITE_OTHER): Payer: Medicare Other | Admitting: Podiatry

## 2019-12-27 ENCOUNTER — Other Ambulatory Visit: Payer: Self-pay

## 2019-12-27 ENCOUNTER — Encounter: Payer: Self-pay | Admitting: Podiatry

## 2019-12-27 VITALS — Temp 97.0°F

## 2019-12-27 DIAGNOSIS — B351 Tinea unguium: Secondary | ICD-10-CM | POA: Diagnosis not present

## 2019-12-27 DIAGNOSIS — M79675 Pain in left toe(s): Secondary | ICD-10-CM

## 2019-12-27 DIAGNOSIS — Z794 Long term (current) use of insulin: Secondary | ICD-10-CM

## 2019-12-27 DIAGNOSIS — M79674 Pain in right toe(s): Secondary | ICD-10-CM | POA: Diagnosis not present

## 2019-12-27 DIAGNOSIS — E1100 Type 2 diabetes mellitus with hyperosmolarity without nonketotic hyperglycemic-hyperosmolar coma (NKHHC): Secondary | ICD-10-CM

## 2019-12-27 NOTE — Progress Notes (Signed)
Complaint:  Visit Type: Patient returns to my office for continued preventative foot care services. Complaint: Patient states" my nails have grown long and thick and become painful to walk and wear shoes" Patient has been diagnosed with DM with no foot complications. The patient presents for preventative foot care services. No changes to ROS.    Podiatric Exam: Vascular: dorsalis pedis and posterior tibial pulses are palpable bilateral. Capillary return is immediate. Temperature gradient is WNL. Skin turgor WNL  Sensorium: Normal Semmes Weinstein monofilament test. Normal tactile sensation bilaterally. Nail Exam: Pt has thick disfigured discolored nails with subungual debris noted bilateral entire nail hallux through fifth toenails especially left hallux toenail. Ulcer Exam: There is no evidence of ulcer or pre-ulcerative changes or infection. Orthopedic Exam: Muscle tone and strength are WNL. No limitations in general ROM. No crepitus or effusions noted. Foot type and digits show no abnormalities. Bony prominences are unremarkable. Skin: No Porokeratosis. No infection or ulcers  Diagnosis:  Onychomycosis, , Pain in right toe, pain in left toes  Treatment & Plan Procedures and Treatment: Consent by patient was obtained for treatment procedures.   Debridement of mycotic and hypertrophic toenails, 1 through 5 bilateral and clearing of subungual debris. No ulceration, no infection noted.  Visual inspection of the foot was performed. Return Visit-Office Procedure: Patient instructed to return to the office for a follow up visit 3 months for continued evaluation and treatment.    Gardiner Barefoot DPM

## 2020-01-03 ENCOUNTER — Other Ambulatory Visit: Payer: Self-pay | Admitting: Family Medicine

## 2020-01-03 MED FILL — LISINOPRIL-HCTZ 20-12.5 MG: 20-12.5 | 30 days supply | Qty: 30 | Fill #1

## 2020-01-03 MED FILL — $VICTOZA 2-PAK 18MG/3ML PEN: 18 | 20 days supply | Qty: 6 | Fill #3

## 2020-01-23 ENCOUNTER — Other Ambulatory Visit: Payer: Self-pay | Admitting: Family Medicine

## 2020-01-23 DIAGNOSIS — E7800 Pure hypercholesterolemia, unspecified: Secondary | ICD-10-CM

## 2020-01-23 DIAGNOSIS — I10 Essential (primary) hypertension: Secondary | ICD-10-CM

## 2020-01-23 MED FILL — $VICTOZA 2-PAK 18MG/3ML PEN: 18 | 30 days supply | Qty: 9 | Fill #0

## 2020-01-23 MED FILL — AMLODIPINE BESYLATE 10 MG T: 10 | 30 days supply | Qty: 30 | Fill #0

## 2020-01-23 MED FILL — $LANTUS SOLOSTAR 100 UNITS/: 100 | 27 days supply | Qty: 18 | Fill #2

## 2020-01-24 MED FILL — ?ATORVASTATIN 40MG TABLET: 40 | 30 days supply | Qty: 30 | Fill #0

## 2020-02-14 MED FILL — TRUEPLUS PEN NDL 31GX5/16: 31G X 8 MM | 25 days supply | Qty: 100 | Fill #1

## 2020-02-14 MED FILL — LISINOPRIL-HCTZ 20-12.5 MG: 20-12.5 | 30 days supply | Qty: 30 | Fill #2

## 2020-02-14 MED FILL — METFORMIN HCL 500 MG TABS: 500 | 30 days supply | Qty: 120 | Fill #0

## 2020-03-08 ENCOUNTER — Other Ambulatory Visit: Payer: Self-pay | Admitting: Pharmacist

## 2020-03-08 ENCOUNTER — Other Ambulatory Visit: Payer: Self-pay | Admitting: Family Medicine

## 2020-03-08 ENCOUNTER — Telehealth: Payer: Self-pay | Admitting: Family Medicine

## 2020-03-08 DIAGNOSIS — I10 Essential (primary) hypertension: Secondary | ICD-10-CM

## 2020-03-08 MED ORDER — AMLODIPINE BESYLATE 10 MG PO TABS: 10.0000 mg | ORAL_TABLET | Freq: Every day | ORAL | 0 refills | Status: DC

## 2020-03-08 MED FILL — ?ATORVASTATIN 40MG TABLET: 40 | 30 days supply | Qty: 30 | Fill #1

## 2020-03-08 MED FILL — AMLODIPINE BESYLATE 10 MG T: 10 | 30 days supply | Qty: 30 | Fill #0

## 2020-03-08 NOTE — Telephone Encounter (Signed)
1) Medication(s) Requested (by name): amLODipine (NORVASC) 10 MG tablet  2) Pharmacy of Choice: [facility] pharmacy 3) Special Requests: Needs his BP medicine till Monday because he has an appt with dr Joya Gaskins and his pcp on 04-08-2020  Approved medications will be sent to the pharmacy, we will reach out if there is an issue.  Requests made after 3pm may not be addressed until the following business day!  If a patient is unsure of the name of the medication(s) please note and ask patient to call back when they are able to provide all info, do not send to responsible party until all information is available!

## 2020-03-08 NOTE — Telephone Encounter (Signed)
Rx has been sent for a 30 day supply.

## 2020-03-11 ENCOUNTER — Ambulatory Visit: Payer: Medicare Other | Admitting: Critical Care Medicine

## 2020-03-12 ENCOUNTER — Encounter: Payer: Self-pay | Admitting: Critical Care Medicine

## 2020-03-12 ENCOUNTER — Other Ambulatory Visit: Payer: Self-pay

## 2020-03-12 ENCOUNTER — Ambulatory Visit: Payer: Medicare Other | Attending: Critical Care Medicine | Admitting: Critical Care Medicine

## 2020-03-12 VITALS — BP 110/74 | HR 91 | Temp 97.7°F | Ht 71.0 in | Wt 197.0 lb

## 2020-03-12 DIAGNOSIS — Z7982 Long term (current) use of aspirin: Secondary | ICD-10-CM | POA: Insufficient documentation

## 2020-03-12 DIAGNOSIS — E1159 Type 2 diabetes mellitus with other circulatory complications: Secondary | ICD-10-CM | POA: Insufficient documentation

## 2020-03-12 DIAGNOSIS — E1169 Type 2 diabetes mellitus with other specified complication: Secondary | ICD-10-CM

## 2020-03-12 DIAGNOSIS — Z79899 Other long term (current) drug therapy: Secondary | ICD-10-CM | POA: Diagnosis not present

## 2020-03-12 DIAGNOSIS — Z833 Family history of diabetes mellitus: Secondary | ICD-10-CM | POA: Insufficient documentation

## 2020-03-12 DIAGNOSIS — E119 Type 2 diabetes mellitus without complications: Secondary | ICD-10-CM | POA: Diagnosis not present

## 2020-03-12 DIAGNOSIS — F17210 Nicotine dependence, cigarettes, uncomplicated: Secondary | ICD-10-CM | POA: Diagnosis not present

## 2020-03-12 DIAGNOSIS — Z794 Long term (current) use of insulin: Secondary | ICD-10-CM | POA: Insufficient documentation

## 2020-03-12 DIAGNOSIS — G4709 Other insomnia: Secondary | ICD-10-CM | POA: Insufficient documentation

## 2020-03-12 DIAGNOSIS — Z8249 Family history of ischemic heart disease and other diseases of the circulatory system: Secondary | ICD-10-CM | POA: Diagnosis not present

## 2020-03-12 DIAGNOSIS — B182 Chronic viral hepatitis C: Secondary | ICD-10-CM | POA: Insufficient documentation

## 2020-03-12 DIAGNOSIS — Z720 Tobacco use: Secondary | ICD-10-CM | POA: Diagnosis not present

## 2020-03-12 DIAGNOSIS — I10 Essential (primary) hypertension: Secondary | ICD-10-CM | POA: Diagnosis not present

## 2020-03-12 MED ORDER — AMLODIPINE BESYLATE 10 MG PO TABS: 10.0000 mg | ORAL_TABLET | Freq: Every day | ORAL | 2 refills | Status: DC

## 2020-03-12 MED ORDER — TRUEPLUS PEN NEEDLES 31G X 8 MM MISC: 2 refills | Status: DC

## 2020-03-12 MED ORDER — GLUCOSE BLOOD VI STRP: ORAL_STRIP | 12 refills | Status: AC

## 2020-03-12 MED ORDER — LISINOPRIL-HYDROCHLOROTHIAZIDE 20-12.5 MG PO TABS: 1.0000 | ORAL_TABLET | Freq: Every day | ORAL | 3 refills | Status: DC

## 2020-03-12 MED ORDER — LANTUS SOLOSTAR 100 UNIT/ML ~~LOC~~ SOPN: PEN_INJECTOR | SUBCUTANEOUS | 3 refills | Status: DC

## 2020-03-12 MED ORDER — ATORVASTATIN CALCIUM 40 MG PO TABS: 40.0000 mg | ORAL_TABLET | Freq: Every day | ORAL | 2 refills | Status: DC

## 2020-03-12 MED ORDER — METFORMIN HCL 500 MG PO TABS: ORAL_TABLET | ORAL | 3 refills | Status: DC

## 2020-03-12 MED ORDER — TRAZODONE HCL 50 MG PO TABS: 50.0000 mg | ORAL_TABLET | Freq: Every evening | ORAL | 3 refills | Status: DC | PRN

## 2020-03-12 MED ORDER — TRUEPLUS LANCETS 28G MISC: 12 refills | Status: DC

## 2020-03-12 MED ORDER — ASPIRIN EC 81 MG PO TBEC: 81.0000 mg | DELAYED_RELEASE_TABLET | Freq: Every day | ORAL | 3 refills | Status: DC

## 2020-03-12 MED ORDER — VICTOZA 18 MG/3ML ~~LOC~~ SOPN: PEN_INJECTOR | SUBCUTANEOUS | 0 refills | Status: DC

## 2020-03-12 NOTE — Assessment & Plan Note (Signed)
Ongoing tobacco use counseling was given and recommendations given to use pursue nicotine replacement therapy

## 2020-03-12 NOTE — Assessment & Plan Note (Signed)
Hypertension under good control we will maintain amlodipine 10 mg daily and Zestoretic daily

## 2020-03-12 NOTE — Assessment & Plan Note (Signed)
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

## 2020-03-12 NOTE — Progress Notes (Signed)
Subjective:    Patient ID: Jesse Wilcox, male    DOB: 02-19-1951, 69 y.o.   MRN: [id number]  This is a 69 year old male who comes in today wishing refills on his medications.  He has type 2 diabetes and hypertension.  He is still actively smoking 1 pack/day of cigarettes.  The patient is in need of dentures as well.  He did have his full Covid vaccine series.  He notes his blood sugars recently been in the 97-100 range. Note the patient is on statin therapy with a history of carotid vascular disease     Past Medical History:  Diagnosis Date  . Carotid artery occlusion   . Chronic hepatitis C ([place])   . Diabetes mellitus      Family History  Problem Relation Age of Onset  . Diabetes Mother   . Heart disease Mother   . Hypertension Mother   . Heart attack Mother   . Diabetes Father   . Heart disease Father   . Hypertension Father   . Diabetes Sister   . Hyperlipidemia Sister   . Heart disease Sister   . Heart attack Sister   . Peripheral vascular disease Sister   . Hyperlipidemia Brother   . Hypertension Brother      Social History   Socioeconomic History  . Marital status: Single    Spouse name: Not on file  . Number of children: Not on file  . Years of education: Not on file  . Highest education level: Not on file  Occupational History  . Not on file  Tobacco Use  . Smoking status: Current Every Day Smoker    Packs/day: 1.00    Years: 47.00    Pack years: 47.00    Types: Cigarettes  . Smokeless tobacco: Never Used  Substance and Sexual Activity  . 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 Drug use: No  . Sexual activity: Not on file  Other Topics Concern  . Not on file  Social History Narrative  . Not on file   Social Determinants of Health   Financial Resource Strain:   . Difficulty of Paying Living Expenses:   Food Insecurity:   . Worried About Charity fundraiser in the Last Year:   . Academic librarian in the Last Year:   Transportation Needs:   . Film/video editor (Medical):   Marland Kitchen Lack of Transportation (Non-Medical):   Physical Activity:   . Days of Exercise per Week:   . Minutes of Exercise per Session:   Stress:   . Feeling of Stress :   Social Connections:   . Frequency of Communication with Friends and Family:   . Frequency of Social Gatherings with Friends and Family:   . Attends Religious Services:   . Active Member of Clubs or Organizations:   . Attends Archivist Meetings:   Marland Kitchen Marital Status:   Intimate Partner Violence:   . Fear of Current or Ex-Partner:   . Emotionally Abused:   Marland Kitchen Physically Abused:   . Sexually Abused:      No Known Allergies   Outpatient Medications Prior to Visit  Medication Sig Dispense Refill  . Blood Glucose Monitoring Suppl (TRUE METRIX METER) w/Device KIT Use to check blood sugar as directed 1 kit 0  . docusate sodium (COLACE) 100 MG capsule Take 1 capsule (100 mg total) by mouth every  12 (twelve) hours. 20 capsule 0  . Miconazole POWD Applied to area of irritation around the foreskin twice daily 1 Bottle 2  . Multiple Vitamin (MULTIVITAMIN WITH MINERALS) TABS Take 1 tablet by mouth daily.    Marland Kitchen amLODipine (NORVASC) 10 MG tablet Take 1 tablet (10 mg total) by mouth daily. Must have office visit for refills 30 tablet 0  . aspirin EC 81 MG tablet Take 1 tablet (81 mg total) by mouth daily. 30 tablet 3  . atorvastatin (LIPITOR) 40 MG tablet Take 1 tablet (40 mg total) by mouth daily. 30 tablet 2  . glucose blood test strip Test 3 times daily. 100 each 12  . LANTUS SOLOSTAR 100 UNIT/ML Solostar Pen INJECT 65 UNITS INTO THE SKIN DAILY. 18 mL 3  . liraglutide (VICTOZA) 18 MG/3ML SOPN INJECT 1.8 MG ONCE DAILY. Please schedule a PCP visit. 9 mL 0  . lisinopril-hydrochlorothiazide (ZESTORETIC) 20-12.5 MG tablet Take 1 tablet by mouth daily. 30 tablet 3  . metFORMIN (GLUCOPHAGE) 500 MG tablet TAKE 2 TABLETS BY MOUTH 2 TIMES DAILY  WITH A MEAL 120 tablet 3  . traZODone (DESYREL) 50 MG tablet Take 1 tablet (50 mg total) by mouth at bedtime as needed for sleep. 30 tablet 3  . TRUEPLUS LANCETS 28G MISC Use as directed 100 each 12  . TRUEPLUS PEN NEEDLES 31G X 8 MM MISC USE AS DIRECTED 4 TIMES DAILY AFTER MEALS AND AT BEDTIME 100 each 2  . baclofen (LIORESAL) 10 MG tablet Take 1 tablet (10 mg total) by mouth 3 (three) times daily as needed for muscle spasms. (Patient not taking: Reported on 03/12/2020) 30 each 0  . HYDROcodone-acetaminophen (NORCO) 5-325 MG tablet Take 1-2 tablets by mouth every 6 (six) hours as needed for severe pain. (Patient not taking: Reported on 03/12/2020) 6 tablet 0  . polyethylene glycol-electrolytes (NULYTELY/GOLYTELY) 420 g solution Take 4,000 mLs by mouth as directed. (Patient not taking: Reported on 03/12/2020) 4000 mL 0   No facility-administered medications prior to visit.       Review of Systems Constitutional:   No  weight loss, night sweats,  Fevers, chills, fatigue, lassitude. HEENT:   No headaches,  Difficulty swallowing,  Tooth/dental problems,  Sore throat,                No sneezing, itching, ear ache, nasal congestion, post nasal drip,   CV:  No chest pain,  Orthopnea, PND, swelling in lower extremities, anasarca, dizziness, palpitations  GI  No heartburn, indigestion, abdominal pain, nausea, vomiting, diarrhea, change in bowel habits, loss of appetite  Resp: No shortness of breath with exertion or at rest.  No excess mucus, no productive cough,  No non-productive cough,  No coughing up of blood.  No change in color of mucus.  No wheezing.  No chest wall deformity  Skin: no rash or lesions.  GU: no dysuria, change in color of urine, no urgency or frequency.  No flank pain.  MS:  No joint pain or swelling.  No decreased range of motion.  No back pain.  Psych:  No change in mood or affect. No depression or anxiety.  No memory loss.     Objective:   Physical Exam  Vitals:    03/12/20 0856  BP: 110/74  Pulse: 91  Temp: 97.7 F (36.5 C)  TempSrc: Temporal  SpO2: 98%  Weight: 197 lb (89.4 kg)  Height: '5\' 11"'$  (1.803 m)    Gen: Pleasant, well-nourished, in no distress,  normal affect  ENT: No lesions,  mouth clear,  oropharynx clear, no postnasal drip  Neck: No JVD, no TMG, no carotid bruits  Lungs: No use of accessory muscles, no dullness to percussion, clear without rales or rhonchi  Cardiovascular: RRR, heart sounds normal, no murmur or gallops, no peripheral edema  Abdomen: soft and NT, no HSM,  BS normal  Musculoskeletal: No deformities, no cyanosis or clubbing  Neuro: alert, non focal  Skin: Warm, no lesions or rashes  No results found.       Assessment & Plan:  I personally reviewed all images and lab data in the [facility] system as well as any outside material available during this office visit and agree with the  radiology impressions.   Essential hypertension, benign Hypertension under good control we will maintain amlodipine 10 mg daily and Zestoretic daily  DM type 2 (diabetes mellitus, type 2) Type 2 diabetes with circular disease complication will continue let atorvastatin and continue Lantus 65 units daily along with Victoza 1.8 mg daily and Metformin 1000 mg twice daily  Will obtain hemoglobin A1c at this visit  Tobacco abuse Ongoing tobacco use counseling was given and recommendations given to use pursue nicotine replacement therapy   Diagnoses and all orders for this visit:  Type 2 diabetes mellitus with other circulatory complication, with long-term current use of insulin (HCC) -     aspirin EC 81 MG tablet; Take 1 tablet (81 mg total) by mouth daily. -     Hemoglobin A1c  Essential hypertension -     amLODipine (NORVASC) 10 MG tablet; Take 1 tablet (10 mg total) by mouth daily. Must have office visit for refills -     lisinopril-hydrochlorothiazide (ZESTORETIC) 20-12.5 MG tablet; Take 1 tablet by mouth daily.  Type 2  diabetes mellitus without complication, with long-term current use of insulin (HCC) -     glucose blood test strip; Test 3 times daily.  Type 2 diabetes mellitus with other specified complication, with long-term current use of insulin (HCC) -     insulin glargine (LANTUS SOLOSTAR) 100 UNIT/ML Solostar Pen; INJECT 65 UNITS INTO THE SKIN DAILY. -     metFORMIN (GLUCOPHAGE) 500 MG tablet; TAKE 2 TABLETS BY MOUTH 2 TIMES DAILY WITH A MEAL  Other insomnia -     traZODone (DESYREL) 50 MG tablet; Take 1 tablet (50 mg total) by mouth at bedtime as needed for sleep.  Type 2 diabetes mellitus without complication (HCC) -     Insulin Pen Needle (TRUEPLUS PEN NEEDLES) 31G X 8 MM MISC; USE AS DIRECTED 4 TIMES DAILY AFTER MEALS AND AT BEDTIME  Essential hypertension, benign  Tobacco abuse  Other orders -     atorvastatin (LIPITOR) 40 MG tablet; Take 1 tablet (40 mg total) by mouth daily. -     liraglutide (VICTOZA) 18 MG/3ML SOPN; INJECT 1.8 MG ONCE DAILY. Please schedule a PCP visit. -     TRUEplus Lancets 28G MISC; Use as directed

## 2020-03-12 NOTE — Progress Notes (Signed)
Med refills

## 2020-03-12 NOTE — Patient Instructions (Signed)
All medications refilled  Stop smoking , use nicotine lozenges 4mg  three times a day to quit, see below  Return dr Margarita Rana 4 months Tobacco Use Disorder Tobacco use disorder (TUD) occurs when a person craves, seeks, and uses tobacco, regardless of the consequences. This disorder can cause problems with mental and physical health. It can affect your ability to have healthy relationships, and it can keep you from meeting your responsibilities at work, home, or school. Tobacco may be:  Smoked as a cigarette or cigar.  Inhaled using e-cigarettes.  Smoked in a pipe or hookah.  Chewed as smokeless tobacco.  Inhaled into the nostrils as snuff. Tobacco products contain a dangerous chemical called nicotine, which is very addictive. Nicotine triggers hormones that make the body feel stimulated and works on areas of the brain that make you feel good. These effects can make it hard for people to quit nicotine. Tobacco contains many other unsafe chemicals that can damage almost every organ in the body. Smoking tobacco also puts others in danger due to fire risk and possible health problems caused by breathing in secondhand smoke. What are the signs or symptoms? Symptoms of TUD may include:  Being unable to slow down or stop your tobacco use.  Spending an abnormal amount of time getting or using tobacco.  Craving tobacco. Cravings may last for up to 6 months after quitting.  Tobacco use that: ? Interferes with your work, school, or home life. ? Interferes with your personal and social relationships. ? Makes you give up activities that you once enjoyed or found important.  Using tobacco even though you know that it is: ? Dangerous or bad for your health or someone else's health. ? Causing problems in your life.  Needing more and more of the substance to get the same effect (developing tolerance).  Experiencing unpleasant symptoms if you do not use the substance (withdrawal). Withdrawal  symptoms may include: ? Depressed, anxious, or irritable mood. ? Difficulty concentrating. ? Increased appetite. ? Restlessness or trouble sleeping.  Using the substance to avoid withdrawal. How is this diagnosed? This condition may be diagnosed based on:  Your current and past tobacco use. Your health care provider may ask questions about how your tobacco use affects your life.  A physical exam. You may be diagnosed with TUD if you have at least two symptoms within a 55-month period. How is this treated? This condition is treated by stopping tobacco use. Many people are unable to quit on their own and need help. Treatment may include:  Nicotine replacement therapy (NRT). NRT provides nicotine without the other harmful chemicals in tobacco. NRT gradually lowers the dosage of nicotine in the body and reduces withdrawal symptoms. NRT is available as: ? Over-the-counter gums, lozenges, and skin patches. ? Prescription mouth inhalers and nasal sprays.  Medicine that acts on the brain to reduce cravings and withdrawal symptoms.  A type of talk therapy that examines your triggers for tobacco use, how to avoid them, and how to cope with cravings (behavioral therapy).  Hypnosis. This may help with withdrawal symptoms.  Joining a support group for others coping with TUD. The best treatment for TUD is usually a combination of medicine, talk therapy, and support groups. Recovery can be a long process. Many people start using tobacco again after stopping (relapse). If you relapse, it does not mean that treatment will not work. Follow these instructions at home:  Lifestyle  Do not use any products that contain nicotine or tobacco, such  as cigarettes and e-cigarettes.  Avoid things that trigger tobacco use as much as you can. Triggers include people and situations that usually cause you to use tobacco.  Avoid drinks that contain caffeine, including coffee. These may worsen some withdrawal  symptoms.  Find ways to manage stress. Wanting to smoke may cause stress, and stress can make you want to smoke. Relaxation techniques such as deep breathing, meditation, and yoga may help.  Attend support groups as needed. These groups are an important part of long-term recovery for many people. General instructions  Take over-the-counter and prescription medicines only as told by your health care provider.  Check with your health care provider before taking any new prescription or over-the-counter medicines.  Decide on a friend, family member, or smoking quit-line (such as 1-800-QUIT-NOW in the [place] that you can call or text when you feel the urge to smoke or when you need help coping with cravings.  Keep all follow-up visits as told by your health care provider and therapist. This is important. Contact a health care provider if:  You are not able to take your medicines as prescribed.  Your symptoms get worse, even with treatment. Summary  Tobacco use disorder (TUD) occurs when a person craves, seeks, and uses tobacco regardless of the consequences.  This condition may be diagnosed based on your current and past tobacco use and a physical exam.  Many people are unable to quit on their own and need help. Recovery can be a long process.  The most effective treatment for TUD is usually a combination of medicine, talk therapy, and support groups. This information is not intended to replace advice given to you by your health care provider. Make sure you discuss any questions you have with your health care provider. Document Revised: 10/06/2017 Document Reviewed: 10/06/2017 Elsevier Patient Education  2020 [employer].

## 2020-03-13 LAB — HEMOGLOBIN A1C
Est. average glucose Bld gHb Est-mCnc: 180 mg/dL
Hgb A1c MFr Bld: 7.9 % — ABNORMAL HIGH (ref 4.8–5.6)

## 2020-03-22 ENCOUNTER — Ambulatory Visit: Payer: Medicare Other | Attending: Family Medicine | Admitting: Pharmacist

## 2020-03-22 ENCOUNTER — Other Ambulatory Visit: Payer: Self-pay

## 2020-03-22 ENCOUNTER — Encounter: Payer: Self-pay | Admitting: Pharmacist

## 2020-03-22 DIAGNOSIS — B1920 Unspecified viral hepatitis C without hepatic coma: Secondary | ICD-10-CM | POA: Insufficient documentation

## 2020-03-22 DIAGNOSIS — I10 Essential (primary) hypertension: Secondary | ICD-10-CM | POA: Diagnosis not present

## 2020-03-22 DIAGNOSIS — F17210 Nicotine dependence, cigarettes, uncomplicated: Secondary | ICD-10-CM | POA: Insufficient documentation

## 2020-03-22 DIAGNOSIS — I6529 Occlusion and stenosis of unspecified carotid artery: Secondary | ICD-10-CM | POA: Insufficient documentation

## 2020-03-22 DIAGNOSIS — Z7901 Long term (current) use of anticoagulants: Secondary | ICD-10-CM | POA: Diagnosis not present

## 2020-03-22 DIAGNOSIS — Z794 Long term (current) use of insulin: Secondary | ICD-10-CM | POA: Diagnosis not present

## 2020-03-22 DIAGNOSIS — E785 Hyperlipidemia, unspecified: Secondary | ICD-10-CM | POA: Insufficient documentation

## 2020-03-22 DIAGNOSIS — E1169 Type 2 diabetes mellitus with other specified complication: Secondary | ICD-10-CM | POA: Diagnosis not present

## 2020-03-22 DIAGNOSIS — Z79899 Other long term (current) drug therapy: Secondary | ICD-10-CM | POA: Insufficient documentation

## 2020-03-22 DIAGNOSIS — Z833 Family history of diabetes mellitus: Secondary | ICD-10-CM | POA: Diagnosis not present

## 2020-03-22 DIAGNOSIS — E118 Type 2 diabetes mellitus with unspecified complications: Secondary | ICD-10-CM | POA: Diagnosis not present

## 2020-03-22 MED ORDER — VICTOZA 18 MG/3ML ~~LOC~~ SOPN: PEN_INJECTOR | SUBCUTANEOUS | 2 refills | Status: DC

## 2020-03-22 NOTE — Progress Notes (Signed)
    S:    PCP: Dr. Margarita Rana  No chief complaint on file.  69 YO male with hx of DM, HTN, HLD, Hep C, and carotid artery occlusion.  Patient arrives in good spirits. Presents for diabetes evaluation, education, and management Patient was referred and last seen by Dr. Joya Gaskins on 03/12/2020.   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  Insurance coverage/medication affordability: medicare  Patient reports adherence with medications.  Current diabetes medications include: Lantus 65 units daily, metformin 1000 mg BID, Victoza 1.8 mg daily Current hypertension medications include: amlodipine 10 mg daily, lisinopril-HCTZ 20-12.5 mg daily Current hyperlipidemia medications include: atorvastatin 40 mg daily   Patient denies hypoglycemic events.  Patient reported dietary habits:  - Hx of non-compliance but pt reports trying to limit carbs - Drinks "Zero" or diet sodas - Denies intake of sweets  Patient-reported exercise habits:  - Not exercising currently    Patient denies polyuria, polydipsia.  Patient denies neuropathy. Patient denies visual changes. Patient reports self foot exams.    O:  POCT: 179 (~30 mins post prandial)  Home fasting CBG: did not bring glucometer but reports levels mostly in the 90s.  Home post-prandial: not checking  Lab Results  Component Value Date   HGBA1C 7.9 (H) 03/12/2020   There were no vitals filed for this visit.  Lipid Panel     Component Value Date/Time   CHOL 131 07/03/2019 0848   TRIG 183 (H) 07/03/2019 0848   HDL 30 (L) 07/03/2019 0848   CHOLHDL 4.4 07/03/2019 0848   CHOLHDL 4.1 04/24/2016 1033   VLDL 38 (H) 04/24/2016 1033   LDLCALC 70 07/03/2019 0848   LDLDIRECT 140.6 08/08/2014 1024   Clinical ASCVD: No  The 10-year ASCVD risk score Mikey Bussing  DC Jr., et al., 2013) is: 39.2%   Values used to calculate the score:     Age: 43 years     Sex: Male     Is Non-Hispanic African American: Yes     Diabetic: Yes     Tobacco smoker: Yes     Systolic Blood Pressure: [id number] mmHg     Is BP treated: Yes     HDL Cholesterol: 30 mg/dL     Total Cholesterol: 131 mg/dL   A/P: Diabetes longstanding currently uncontrolled. Patient is able to verbalize appropriate hypoglycemia management plan. Patient is adherent with medication. Control is suboptimal due to dietary indiscretion and physical inactivity. A1c is close to goal and pt may beneffit from an SGLT-2 inhibitor but he does not wish to add medications today.  -Continue current medications. -Extensively discussed pathophysiology of DM, recommended lifestyle interventions, dietary effects on glycemic control -Counseled on s/sx of and management of hypoglycemia -Next A1C anticipated end 06/2020.   Written patient instructions provided.  Total time in face to face counseling 15 minutes.   Follow up w/ PCP.     Benard Halsted, PharmD, [place] [phone]

## 2020-03-27 ENCOUNTER — Other Ambulatory Visit: Payer: Self-pay

## 2020-03-27 ENCOUNTER — Encounter: Payer: Self-pay | Admitting: Podiatry

## 2020-03-27 ENCOUNTER — Ambulatory Visit (INDEPENDENT_AMBULATORY_CARE_PROVIDER_SITE_OTHER): Payer: Medicare Other | Admitting: Podiatry

## 2020-03-27 VITALS — Temp 98.1°F

## 2020-03-27 DIAGNOSIS — Z794 Long term (current) use of insulin: Secondary | ICD-10-CM

## 2020-03-27 DIAGNOSIS — E1100 Type 2 diabetes mellitus with hyperosmolarity without nonketotic hyperglycemic-hyperosmolar coma (NKHHC): Secondary | ICD-10-CM

## 2020-03-27 DIAGNOSIS — B351 Tinea unguium: Secondary | ICD-10-CM

## 2020-03-27 DIAGNOSIS — M79674 Pain in right toe(s): Secondary | ICD-10-CM

## 2020-03-27 DIAGNOSIS — M79675 Pain in left toe(s): Secondary | ICD-10-CM | POA: Diagnosis not present

## 2020-03-27 NOTE — Progress Notes (Signed)
This patient returns to my office for at risk foot care.  This patient requires this care by a professional since this patient will be at risk due to having diabetes type 2.    This patient is unable to cut nails himself since the patient cannot reach his nails.These nails are painful walking and wearing shoes.  This patient presents for at risk foot care today.  General Appearance  Alert, conversant and in no acute stress.  Vascular  Dorsalis pedis and posterior tibial  pulses are palpable  bilaterally.  Capillary return is within normal limits  bilaterally. Temperature is within normal limits  bilaterally.  Neurologic  Senn-Weinstein monofilament wire test within normal limits  bilaterally. Muscle power within normal limits bilaterally.  Nails Thick disfigured discolored nails with subungual debris  from hallux to fifth toes bilaterally. No evidence of bacterial infection or drainage bilaterally.  Orthopedic  No limitations of motion  feet .  No crepitus or effusions noted.  No bony pathology or digital deformities noted.  Skin  normotropic skin with no porokeratosis noted bilaterally.  No signs of infections or ulcers noted.     Onychomycosis  Pain in right toes  Pain in left toes  Consent was obtained for treatment procedures.   Mechanical debridement of nails 1-5  bilaterally performed with a nail nipper.  Filed with dremel without incident.  Visual inspection of the foot was performed.   Return office visit   3 months                  Told patient to return for periodic foot care and evaluation due to potential at risk complications.   Leul Narramore DPM  

## 2020-03-29 ENCOUNTER — Other Ambulatory Visit: Payer: Self-pay

## 2020-03-29 ENCOUNTER — Ambulatory Visit: Payer: Medicare Other | Attending: Family Medicine

## 2020-04-03 DIAGNOSIS — R1012 Left upper quadrant pain: Secondary | ICD-10-CM | POA: Diagnosis not present

## 2020-04-03 DIAGNOSIS — Z79899 Other long term (current) drug therapy: Secondary | ICD-10-CM | POA: Diagnosis not present

## 2020-04-03 DIAGNOSIS — R109 Unspecified abdominal pain: Secondary | ICD-10-CM | POA: Diagnosis not present

## 2020-04-04 DIAGNOSIS — R109 Unspecified abdominal pain: Secondary | ICD-10-CM | POA: Diagnosis not present

## 2020-04-04 DIAGNOSIS — R1012 Left upper quadrant pain: Secondary | ICD-10-CM | POA: Diagnosis not present

## 2020-04-04 MED FILL — traMADol HCL 50 MG TABS: 50 | 2 days supply | Qty: 12 | Fill #0

## 2020-04-08 ENCOUNTER — Ambulatory Visit: Payer: Medicare Other | Admitting: Family Medicine

## 2020-04-18 MED FILL — ?ATORVASTATIN 40MG TABLET: 40 | 30 days supply | Qty: 30 | Fill #2

## 2020-04-18 MED FILL — ?AMLODIPINE BESYL 10MG TABL: 10 | 30 days supply | Qty: 30 | Fill #0

## 2020-05-09 MED FILL — LISINOPRIL-HCTZ 20-12.5 MG: 20-12.5 | 30 days supply | Qty: 30 | Fill #1

## 2020-05-29 MED FILL — ?AMLODIPINE BESYL 10MG TABL: 10 | 30 days supply | Qty: 30 | Fill #1

## 2020-05-30 MED FILL — ATORVASTATIN CALCIUM 40 MG: 40 | 30 days supply | Qty: 30 | Fill #0

## 2020-05-31 ENCOUNTER — Other Ambulatory Visit: Payer: Self-pay | Admitting: Family Medicine

## 2020-05-31 DIAGNOSIS — I10 Essential (primary) hypertension: Secondary | ICD-10-CM

## 2020-05-31 MED ORDER — AMLODIPINE BESYLATE 10 MG PO TABS: 10.0000 mg | ORAL_TABLET | Freq: Every day | ORAL | 2 refills | Status: DC

## 2020-05-31 MED ORDER — LISINOPRIL-HYDROCHLOROTHIAZIDE 20-12.5 MG PO TABS: 1.0000 | ORAL_TABLET | Freq: Every day | ORAL | 3 refills | Status: DC

## 2020-05-31 MED ORDER — ATORVASTATIN CALCIUM 40 MG PO TABS: 40.0000 mg | ORAL_TABLET | Freq: Every day | ORAL | 2 refills | Status: DC

## 2020-05-31 NOTE — Telephone Encounter (Signed)
Copied from [place] [phone] [phone]. Topic: Quick Communication - Rx Refill/Question >> May 31, 2020  8:46 AM Leward Quan A wrote: Medication: amLODipine (NORVASC) 10 MG tablet, atorvastatin (LIPITOR) 40 MG tablet, lisinopril-hydrochlorothiazide (ZESTORETIC) 20-12.5 MG tablet   Has the patient contacted their pharmacy? Yes.   (Agent: If no, request that the patient contact the pharmacy for the refill.) (Agent: If yes, when and what did the pharmacy advise?)  Preferred Pharmacy (with phone number or street name): [place], [place] [address]  Phone:  [phone] Fax:  [phone]     Agent: Please be advised that RX refills may take up to 3 business days. We ask that you follow-up with your pharmacy.

## 2020-06-03 ENCOUNTER — Telehealth: Payer: Self-pay

## 2020-06-03 DIAGNOSIS — I10 Essential (primary) hypertension: Secondary | ICD-10-CM

## 2020-06-03 MED FILL — $VICTOZA 2-PAK 18MG/3ML PEN: 18 | 90 days supply | Qty: 27 | Fill #0

## 2020-06-03 MED FILL — LISINOPRIL-HCTZ 20-12.5 MG: 20-12.5 | 30 days supply | Qty: 30 | Fill #2

## 2020-06-03 MED FILL — ?BASAGLAR 100 UNITS/ML KWPE: 100 | 27 days supply | Qty: 18 | Fill #1

## 2020-06-03 NOTE — Telephone Encounter (Signed)
1) Medication(s) Requested (by name): lisinopril & amlodipine & atovastatin  2) Pharmacy of Choice: [facility]  3) Special Requests:out of all. No refills. Pt ph [phone].   Approved medications will be sent to the pharmacy, we will reach out if there is an issue.  Requests made after 3pm may not be addressed until the following business day!  If a patient is unsure of the name of the medication(s) please note and ask patient to call back when they are able to provide all info, do not send to responsible party until all information is available!

## 2020-06-03 NOTE — Telephone Encounter (Signed)
Pt has refills available in the pharmacy. I have placed these for him.

## 2020-06-06 MED FILL — TRUEPLUS PEN NDL 31GX5/16: 31G X 8 MM | 25 days supply | Qty: 100 | Fill #2

## 2020-06-07 ENCOUNTER — Encounter (HOSPITAL_COMMUNITY): Payer: Self-pay

## 2020-06-07 ENCOUNTER — Ambulatory Visit (INDEPENDENT_AMBULATORY_CARE_PROVIDER_SITE_OTHER): Payer: Medicare Other

## 2020-06-07 ENCOUNTER — Ambulatory Visit (HOSPITAL_COMMUNITY)
Admission: EM | Admit: 2020-06-07 | Discharge: 2020-06-07 | Disposition: A | Discharge status: Home Health | Payer: Medicare Other | Attending: Family Medicine | Admitting: Family Medicine

## 2020-06-07 ENCOUNTER — Other Ambulatory Visit: Payer: Self-pay

## 2020-06-07 DIAGNOSIS — R109 Unspecified abdominal pain: Secondary | ICD-10-CM | POA: Diagnosis not present

## 2020-06-07 DIAGNOSIS — F17210 Nicotine dependence, cigarettes, uncomplicated: Secondary | ICD-10-CM | POA: Diagnosis not present

## 2020-06-07 DIAGNOSIS — Z7982 Long term (current) use of aspirin: Secondary | ICD-10-CM | POA: Insufficient documentation

## 2020-06-07 DIAGNOSIS — M5116 Intervertebral disc disorders with radiculopathy, lumbar region: Secondary | ICD-10-CM | POA: Insufficient documentation

## 2020-06-07 DIAGNOSIS — M79605 Pain in left leg: Secondary | ICD-10-CM | POA: Insufficient documentation

## 2020-06-07 DIAGNOSIS — E785 Hyperlipidemia, unspecified: Secondary | ICD-10-CM | POA: Insufficient documentation

## 2020-06-07 DIAGNOSIS — B182 Chronic viral hepatitis C: Secondary | ICD-10-CM | POA: Insufficient documentation

## 2020-06-07 DIAGNOSIS — I10 Essential (primary) hypertension: Secondary | ICD-10-CM | POA: Diagnosis not present

## 2020-06-07 DIAGNOSIS — Z20822 Contact with and (suspected) exposure to covid-19: Secondary | ICD-10-CM | POA: Diagnosis not present

## 2020-06-07 DIAGNOSIS — M47817 Spondylosis without myelopathy or radiculopathy, lumbosacral region: Secondary | ICD-10-CM

## 2020-06-07 DIAGNOSIS — Z79899 Other long term (current) drug therapy: Secondary | ICD-10-CM | POA: Diagnosis not present

## 2020-06-07 DIAGNOSIS — M5416 Radiculopathy, lumbar region: Secondary | ICD-10-CM

## 2020-06-07 DIAGNOSIS — Z794 Long term (current) use of insulin: Secondary | ICD-10-CM | POA: Insufficient documentation

## 2020-06-07 DIAGNOSIS — M545 Low back pain: Secondary | ICD-10-CM | POA: Diagnosis not present

## 2020-06-07 DIAGNOSIS — M4726 Other spondylosis with radiculopathy, lumbar region: Secondary | ICD-10-CM | POA: Diagnosis not present

## 2020-06-07 DIAGNOSIS — E119 Type 2 diabetes mellitus without complications: Secondary | ICD-10-CM | POA: Insufficient documentation

## 2020-06-07 LAB — POCT URINALYSIS DIPSTICK, ED / UC
Glucose, UA: NEGATIVE mg/dL
Hgb urine dipstick: NEGATIVE
Ketones, ur: 15 mg/dL — AB
Leukocytes,Ua: NEGATIVE
Nitrite: NEGATIVE
Protein, ur: 30 mg/dL — AB
Specific Gravity, Urine: 1.025 (ref 1.005–1.030)
Urobilinogen, UA: 1 mg/dL (ref 0.0–1.0)
pH: 5.5 (ref 5.0–8.0)

## 2020-06-07 LAB — SARS CORONAVIRUS 2 (TAT 6-24 HRS): SARS Coronavirus 2: NEGATIVE

## 2020-06-07 MED ORDER — HYDROCODONE-ACETAMINOPHEN 7.5-325 MG PO TABS: 1.0000 | ORAL_TABLET | Freq: Four times a day (QID) | ORAL | 0 refills | Status: DC | PRN

## 2020-06-07 MED ORDER — GABAPENTIN 300 MG PO CAPS
300.0000 mg | ORAL_CAPSULE | Freq: Three times a day (TID) | ORAL | 0 refills | Status: DC
Start: 2020-06-07 — End: 2020-09-30

## 2020-06-07 MED ORDER — GABAPENTIN 300 MG PO CAPS
300.0000 mg | ORAL_CAPSULE | Freq: Three times a day (TID) | ORAL | 0 refills | Status: DC
Start: 2020-06-07 — End: 2020-06-07

## 2020-06-07 MED FILL — GABAPENTIN 300 MG CAPSULE: 300 | 30 days supply | Qty: 90 | Fill #0

## 2020-06-07 NOTE — ED Provider Notes (Signed)
[place]    CSN: [id number] Arrival date & time: 06/07/20  3976      History   Chief Complaint Chief Complaint  Patient presents with  . Flank Pain    HPI Jesse Wilcox is a 69 y.o. male.   HPI   Pleasant 69 year old gentleman.  Calls himself a Designer, jewellery".  He states his activity level includes "wearing out the couch". He is here with left flank pain.  Is been present for several days.  It radiates from his left flank down into the back of his left thigh.  No abdominal pain.  No nausea or vomiting.  No fever.  No urinary symptoms or hematuria. He does have a past history of back pain with spondylolisthesis and facet arthropathy.  He states he does not have chronic back pain. He states he tried some "old pain medicine" that he had at home.  He states it did not work. He smokes cigarettes He does not drink alcohol  Past Medical History:  Diagnosis Date  . Carotid artery occlusion   . Chronic hepatitis C ([place])   . Diabetes mellitus     Patient Active Problem List   Diagnosis Date Noted  . Pain due to onychomycosis of toenails of both feet 06/16/2019  . Chronic hepatitis C ([place]) 10/29/2015  . Hyperlipidemia 10/29/2015  . Tobacco abuse 10/29/2015  . Spondylolisthesis at L4-L5 level 11/14/2014  . Essential hypertension, benign 07/17/2014  . Occlusion and stenosis of carotid artery without mention of cerebral infarction 04/15/2012  . DM type 2 (diabetes mellitus, type 2) ([place]) 02/10/2012    Past Surgical History:  Procedure Laterality Date  . Aortic arch angiogram  04-15-12   By Dr. Einar Gip  . ARCH AORTOGRAM  04/15/2012   Procedure: ARCH AORTOGRAM;  Surgeon: Laverda Page, MD;  Location: [facility] CATH LAB;  Service: Cardiovascular;;  . CAROTID ANGIOGRAM N/A 04/15/2012   Procedure: CAROTID ANGIOGRAM;  Surgeon: Laverda Page, MD;  Location: [facility] CATH LAB;  Service: Cardiovascular;  Laterality: N/A;  . CAROTID STENT INSERTION N/A 08/23/2012    Procedure: CAROTID STENT INSERTION;  Surgeon: Elam Dutch, MD;  Location: [facility] CATH LAB;  Service: Cardiovascular;  Laterality: N/A;       Home Medications    Prior to Admission medications   Medication Sig Start Date End Date Taking? Authorizing Provider  amLODipine (NORVASC) 10 MG tablet Take 1 tablet (10 mg total) by mouth daily. Must have office visit for refills 05/31/20   Elsie Stain, MD  aspirin EC 81 MG tablet Take 1 tablet (81 mg total) by mouth daily. 03/12/20   Elsie Stain, MD  atorvastatin (LIPITOR) 40 MG tablet Take 1 tablet (40 mg total) by mouth daily. 05/31/20   Elsie Stain, MD  Blood Glucose Monitoring Suppl (TRUE METRIX METER) w/Device KIT Use to check blood sugar as directed 12/09/15   Charlott Rakes, MD  docusate sodium (COLACE) 100 MG capsule Take 1 capsule (100 mg total) by mouth every 12 (twelve) hours. 01/13/18   Harris, Vernie Shanks, PA-C  gabapentin (NEURONTIN) 300 MG capsule Take 1 capsule (300 mg total) by mouth 3 (three) times daily. 06/07/20   Raylene Everts, MD  glucose blood test strip Test 3 times daily. 03/12/20   Elsie Stain, MD  HYDROcodone-acetaminophen (NORCO) 7.5-325 MG tablet Take 1 tablet by mouth every 6 (six) hours as needed for moderate pain. 06/07/20   Raylene Everts, MD  insulin glargine (LANTUS SOLOSTAR) 100  UNIT/ML Solostar Pen INJECT 65 UNITS INTO THE SKIN DAILY. 03/12/20   Elsie Stain, MD  Insulin Pen Needle (TRUEPLUS PEN NEEDLES) 31G X 8 MM MISC USE AS DIRECTED 4 TIMES DAILY AFTER MEALS AND AT BEDTIME 03/12/20   Elsie Stain, MD  liraglutide (VICTOZA) 18 MG/3ML SOPN INJECT 1.8 MG ONCE DAILY. 03/22/20   Charlott Rakes, MD  lisinopril-hydrochlorothiazide (ZESTORETIC) 20-12.5 MG tablet Take 1 tablet by mouth daily. 05/31/20   Elsie Stain, MD  metFORMIN (GLUCOPHAGE) 500 MG tablet TAKE 2 TABLETS BY MOUTH 2 TIMES DAILY WITH A MEAL 03/12/20   Elsie Stain, MD  Miconazole POWD Applied to area of irritation around  the foreskin twice daily 05/25/15   Darlyne Russian, MD  Multiple Vitamin (MULTIVITAMIN WITH MINERALS) TABS Take 1 tablet by mouth daily.    [provider]  traZODone (DESYREL) 50 MG tablet Take 1 tablet (50 mg total) by mouth at bedtime as needed for sleep. 03/12/20   Elsie Stain, MD  TRUEplus Lancets 28G MISC Use as directed 03/12/20   Elsie Stain, MD    Family History Family History  Problem Relation Age of Onset  . Diabetes Mother   . Heart disease Mother   . Hypertension Mother   . Heart attack Mother   . Diabetes Father   . Heart disease Father   . Hypertension Father   . Diabetes Sister   . Hyperlipidemia Sister   . Heart disease Sister   . Heart attack Sister   . Peripheral vascular disease Sister   . Hyperlipidemia Brother   . Hypertension Brother     Social History Social History   Tobacco Use  . Smoking status: Current Every Day Smoker    Packs/day: 1.00    Years: 47.00    Pack years: 47.00    Types: Cigarettes  . Smokeless tobacco: Never Used  Substance Use Topics  . 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 Drug use: No     Allergies   Patient has no known allergies.   Review of Systems Review of Systems See HPI  Physical Exam Triage Vital Signs ED Triage Vitals  Enc Vitals Group     BP 06/07/20 1010 105/64     Pulse Rate 06/07/20 1010 95     Resp 06/07/20 1010 16     Temp 06/07/20 1010 98.2 F (36.8 C)     Temp src --      SpO2 06/07/20 1010 96 %     Weight --      Height --      Head Circumference --      Peak Flow --      Pain Score 06/07/20 1009 9     Pain Loc --      Pain Edu? --      Excl. in [place]? --    No data found.  Updated Vital Signs BP (!) 132/54   Pulse 82   Temp 98.2 F (36.8 C)   Resp 16   SpO2 97%      Physical Exam Constitutional:      General: He is not in acute distress.    Appearance: Normal appearance. He is well-developed and normal  weight.  HENT:     Head: Normocephalic and atraumatic.     Mouth/Throat:     Comments: Mask in place Eyes:     Conjunctiva/sclera: Conjunctivae  normal.     Pupils: Pupils are equal, round, and reactive to light.  Cardiovascular:     Rate and Rhythm: Normal rate and regular rhythm.     Heart sounds: Normal heart sounds.  Pulmonary:     Effort: Pulmonary effort is normal. No respiratory distress.     Breath sounds: Normal breath sounds.  Chest:     Chest wall: No tenderness.  Abdominal:     General: There is no distension.     Palpations: Abdomen is soft.     Tenderness: There is no abdominal tenderness. There is no right CVA tenderness or left CVA tenderness.  Musculoskeletal:        General: Normal range of motion.     Cervical back: Normal range of motion.     Comments: Patient expresses pain with forward flexion.  Range of motion is slow but full.  There is no tenderness palpation of the lumbar spine, posterior ribs, muscles or posterior pelvis.  Straight leg raise is negative bilaterally.  Reflexes are full throughout.  Abdominal exam is benign with no tenderness.  Lymphadenopathy:     Cervical: No cervical adenopathy.  Skin:    General: Skin is warm and dry.  Neurological:     General: No focal deficit present.     Mental Status: He is alert.     Gait: Gait normal.     Deep Tendon Reflexes: Reflexes normal.  Psychiatric:        Mood and Affect: Mood normal.        Behavior: Behavior normal.      UC Treatments / Results  Labs (all labs ordered are listed, but only abnormal results are displayed) Labs Reviewed  POCT URINALYSIS DIPSTICK, ED / UC - Abnormal; Notable for the following components:      Result Value   Bilirubin Urine SMALL (*)    Ketones, ur 15 (*)    Protein, ur 30 (*)    All other components within normal limits  SARS CORONAVIRUS 2 (TAT 6-24 HRS)    EKG   Radiology DG Lumbar Spine Complete  Result Date: 06/07/2020 CLINICAL DATA:  Acute lower  back pain without known injury. EXAM: LUMBAR SPINE - COMPLETE 4+ VIEW COMPARISON:  November 14, 2014. FINDINGS: Minimal grade 1 anterolisthesis of L4-5 is noted secondary to posterior facet joint hypertrophy. No fracture is noted. Mild anterior osteophyte formation is noted at L1-2, L2-3, L3-4 and L5-S1. No significant disc space narrowing is noted. Degenerative changes are seen involving posterior facet joints of L4-5 and L5-S1 bilaterally. IMPRESSION: Minimal grade 1 anterolisthesis of L4-5 is noted secondary to posterior facet joint hypertrophy. Mild multilevel degenerative disc disease is noted. No acute abnormality is noted. Electronically Signed   By: Marijo Conception M.D.   On: 06/07/2020 11:14    Procedures Procedures (including critical care time)  Medications Ordered in UC Medications - No data to display  Initial Impression / Assessment and Plan / UC Course  I have reviewed the triage vital signs and the nursing notes.  Pertinent labs & imaging results that were available during my care of the patient were reviewed by me and considered in my medical decision making (see chart for details).     *I feel his pain is likely from his lumbar degenerative disease.  We will treat with pain management management and gabapentin.  I considered steroids with insulin-dependent diabetes, questionable control I think this is ill-advised.  Follow-up with PCP Final Clinical Impressions(s) /  UC Diagnoses   Final diagnoses:  Lumbar and sacral spondyloarthritis  Left lumbar radiculopathy     Discharge Instructions     Use ice or heat to painful area Avoid bending and lifting activities Take hydrocodone as needed for pain.  Do not drive on hydrocodone Take gabapentin for the nerve pain.  Take at bedtime.  May take up to 2 or 3 times a day. These medicines can cause drowsiness Call your primary care doctor if not improving by next week    ED Prescriptions    Medication Sig Dispense Auth.  Provider   gabapentin (NEURONTIN) 300 MG capsule Take 1 capsule (300 mg total) by mouth 3 (three) times daily. 90 capsule Raylene Everts, MD   HYDROcodone-acetaminophen [facility]) 7.5-325 MG tablet Take 1 tablet by mouth every 6 (six) hours as needed for moderate pain. 20 tablet Raylene Everts, MD     I have reviewed the PDMP during this encounter.   Raylene Everts, MD 06/07/20 [phone]

## 2020-06-07 NOTE — Discharge Instructions (Addendum)
Use ice or heat to painful area Avoid bending and lifting activities Take hydrocodone as needed for pain.  Do not drive on hydrocodone Take gabapentin for the nerve pain.  Take at bedtime.  May take up to 2 or 3 times a day. These medicines can cause drowsiness Call your primary care doctor if not improving by next week

## 2020-06-07 NOTE — ED Triage Notes (Signed)
Pt c/o pain to left flank x 2 days, states pain is now going down left leg.

## 2020-06-20 NOTE — Progress Notes (Signed)
S:  PCP: [facility]  No chief complaint on file.  69 YO male with hx of DM, HTN, HLD, Hep C, and carotid artery occlusion.   Patient arrives in no acute distress. Presents for diabetes evaluation, education, and management. Patient was referred and last seen by Dr. Joya Gaskins on 03/12/20.  Patient was last seen by Clinical Pharmacist on 03/22/20. At that visit, A1c was close to goal, pt denied offer to start SGLT2, pt to f/u with PCP.  In interim, pt presented to ED on 06/07/20 for left flank pain determined to be from lumbar degenerative disease, no steroids given.  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  Insurance coverage/medication affordability: Medicare   Current diabetes medications:  1. Lantus 65 units daily in evening  2. Metformin 1000 mg BID 3. Victoza 1.8 mg daily  - pt self-discontinued a month ago due to trending lower BG and not eating as much  Adverse effects: denies Medication adherence: 2-3x in last mo misses insulin dose  Prior diabetes med trials: glipizide, novolog  Hypoglycemia: - Denies any BG < 70. Does not experience any sx and only tests fasting BG. - Treats with: hard candy  Lifestyle: Diet: not eating as much per pt d/t not cooking as often and not going out to eat. Denies changes in appetite. Eats 1 meal/day - Breakfast: bacon, eggs, grits - Snacks: little debbies, sandwich, sometimes goes to fast food restaurants  - Drinks: mainly water, sometimes diet sodas like ginger ale  Exercise: none  DM Comorbidities: - Nocturia: yes, no change from prior - Neuropathy: denies - Retinopathy: denies - Gastroparesis: denies   Current hypertension medications include: amlodipine 10 mg daily, lisinopril-HCTZ 20-12.5 mg daily Current hyperlipidemia medications include:  atorvastatin 40 mg daily  O:  POCT BG today: 170 (coffee with creamer right before visit today)  Lab Results  Component Value Date   HGBA1C 7.9 (H) 03/12/2020   There were no vitals filed for this visit.  Home BG Readings FBG: 110, 94, highest 135 in last month 2 hour post-meal/random: not checking  Lipid Panel     Component Value Date/Time   CHOL 131 07/03/2019 0848   TRIG 183 (H) 07/03/2019 0848   HDL 30 (L) 07/03/2019 0848   CHOLHDL 4.4 07/03/2019 0848   CHOLHDL 4.1 04/24/2016 1033   VLDL 38 (H) 04/24/2016 1033   [place] 70 07/03/2019 0848   LDLDIRECT 140.6 08/08/2014 1024    Clinical Atherosclerotic Cardiovascular Disease (ASCVD): No  The 10-year ASCVD risk score Mikey Bussing DC Jr., et al., 2013) is: 50.7%   Values used to calculate the score:     Age: 89 years     Sex: Male     Is Non-Hispanic African American: Yes     Diabetic: Yes     Tobacco smoker: Yes     Systolic Blood Pressure: 017 mmHg     Is BP treated: Yes     HDL Cholesterol: 30 mg/dL     Total Cholesterol: 131 mg/dL   A: 1. Diabetes - Most recent A1c close to goal < 7%, next due today - Clinic BG today not at goal, not true fasting due to coffee with creamer right before visit - Home BG readings at goal despite pt no longer taking GLP1 - No episodes of  hypoglycemia reported. Possibly hypoglycemia unaware - Consider restarting Victoza if A1c not at goal  P: - Continue lantus 65 units daily - Continue metformin 1000 mg BID - Discontinue Victoza (pt not taking for 1 month, will remove from medication list) - Labs today:  FLP, BMP, A1c, Microalbumin/Creatinine  - F/U existing PCP Clinic Visit on 07/04/20.  Total time in face to face counseling 25 minutes.    Fara Olden, PharmD PGY-1 Ambulatory Care Pharmacy Resident 06/21/2020 9:31 AM

## 2020-06-21 ENCOUNTER — Encounter: Payer: Self-pay | Admitting: Pharmacist

## 2020-06-21 ENCOUNTER — Ambulatory Visit: Payer: Medicare Other | Attending: Family Medicine | Admitting: Pharmacist

## 2020-06-21 ENCOUNTER — Other Ambulatory Visit: Payer: Self-pay

## 2020-06-21 DIAGNOSIS — E1169 Type 2 diabetes mellitus with other specified complication: Secondary | ICD-10-CM | POA: Diagnosis not present

## 2020-06-21 DIAGNOSIS — R748 Abnormal levels of other serum enzymes: Secondary | ICD-10-CM | POA: Diagnosis not present

## 2020-06-21 DIAGNOSIS — E7800 Pure hypercholesterolemia, unspecified: Secondary | ICD-10-CM | POA: Diagnosis not present

## 2020-06-21 DIAGNOSIS — Z794 Long term (current) use of insulin: Secondary | ICD-10-CM

## 2020-06-21 LAB — GLUCOSE, POCT (MANUAL RESULT ENTRY): POC Glucose: 170 mg/dl — AB (ref 70–99)

## 2020-06-21 NOTE — Addendum Note (Signed)
Addended by: Daisy Blossom, Annie Main L on: 06/21/2020 10:41 AM   Modules accepted: Orders

## 2020-06-22 LAB — CMP14+EGFR
ALT: 71 IU/L — ABNORMAL HIGH (ref 0–44)
AST: 41 IU/L — ABNORMAL HIGH (ref 0–40)
Albumin/Globulin Ratio: 0.8 — ABNORMAL LOW (ref 1.2–2.2)
Albumin: 3.6 g/dL — ABNORMAL LOW (ref 3.8–4.8)
Alkaline Phosphatase: 325 IU/L — ABNORMAL HIGH (ref 48–121)
BUN/Creatinine Ratio: 12 (ref 10–24)
BUN: 10 mg/dL (ref 8–27)
Bilirubin Total: 0.8 mg/dL (ref 0.0–1.2)
CO2: 23 mmol/L (ref 20–29)
Calcium: 8.9 mg/dL (ref 8.6–10.2)
Chloride: 101 mmol/L (ref 96–106)
Creatinine, Ser: 0.81 mg/dL (ref 0.76–1.27)
GFR calc Af Amer: 106 mL/min/{1.73_m2} (ref >59)
GFR calc non Af Amer: 91 mL/min/{1.73_m2} (ref >59)
Globulin, Total: 4.8 g/dL — ABNORMAL HIGH (ref 1.5–4.5)
Glucose: 149 mg/dL — ABNORMAL HIGH (ref 65–99)
Potassium: 4.7 mmol/L (ref 3.5–5.2)
Sodium: 136 mmol/L (ref 134–144)
Total Protein: 8.4 g/dL (ref 6.0–8.5)

## 2020-06-22 LAB — HEMOGLOBIN A1C
Est. average glucose Bld gHb Est-mCnc: 157 mg/dL
Hgb A1c MFr Bld: 7.1 % — ABNORMAL HIGH (ref 4.8–5.6)

## 2020-06-22 LAB — LIPID PANEL
Chol/HDL Ratio: 5 ratio (ref 0.0–5.0)
Cholesterol, Total: 120 mg/dL (ref 100–199)
HDL: 24 mg/dL — ABNORMAL LOW (ref >39)
LDL Chol Calc (NIH): 76 mg/dL (ref 0–99)
Triglycerides: 103 mg/dL (ref 0–149)
VLDL Cholesterol Cal: 20 mg/dL (ref 5–40)

## 2020-06-22 LAB — MICROALBUMIN / CREATININE URINE RATIO
Creatinine, Urine: 349.2 mg/dL
Microalb/Creat Ratio: 10 mg/g creat (ref 0–29)
Microalbumin, Urine: 35.2 ug/mL

## 2020-06-24 ENCOUNTER — Other Ambulatory Visit: Payer: Self-pay | Admitting: Family Medicine

## 2020-06-24 DIAGNOSIS — R748 Abnormal levels of other serum enzymes: Secondary | ICD-10-CM

## 2020-06-25 ENCOUNTER — Other Ambulatory Visit (INDEPENDENT_AMBULATORY_CARE_PROVIDER_SITE_OTHER): Payer: Self-pay | Admitting: Family Medicine

## 2020-06-25 DIAGNOSIS — R7989 Other specified abnormal findings of blood chemistry: Secondary | ICD-10-CM

## 2020-06-25 LAB — PSA, TOTAL AND FREE
PSA, Free Pct: 13.3 %
PSA, Free: 0.08 ng/mL
Prostate Specific Ag, Serum: 0.6 ng/mL (ref 0.0–4.0)

## 2020-06-25 LAB — SPECIMEN STATUS REPORT

## 2020-06-26 ENCOUNTER — Telehealth: Payer: Self-pay

## 2020-06-26 ENCOUNTER — Other Ambulatory Visit: Payer: Self-pay

## 2020-06-26 ENCOUNTER — Ambulatory Visit (INDEPENDENT_AMBULATORY_CARE_PROVIDER_SITE_OTHER): Payer: Medicare Other | Admitting: Podiatry

## 2020-06-26 ENCOUNTER — Encounter: Payer: Self-pay | Admitting: Podiatry

## 2020-06-26 DIAGNOSIS — E1100 Type 2 diabetes mellitus with hyperosmolarity without nonketotic hyperglycemic-hyperosmolar coma (NKHHC): Secondary | ICD-10-CM

## 2020-06-26 DIAGNOSIS — M79675 Pain in left toe(s): Secondary | ICD-10-CM | POA: Diagnosis not present

## 2020-06-26 DIAGNOSIS — B351 Tinea unguium: Secondary | ICD-10-CM

## 2020-06-26 DIAGNOSIS — M79674 Pain in right toe(s): Secondary | ICD-10-CM | POA: Diagnosis not present

## 2020-06-26 DIAGNOSIS — Z794 Long term (current) use of insulin: Secondary | ICD-10-CM

## 2020-06-26 NOTE — Telephone Encounter (Signed)
Patient name and DOB has been verified Patient was informed of lab results. Patient had no questions.  Patient was informed of ultrasound appointment.

## 2020-06-26 NOTE — Progress Notes (Signed)
This patient returns to my office for at risk foot care.  This patient requires this care by a professional since this patient will be at risk due to having diabetes type 2.    This patient is unable to cut nails himself since the patient cannot reach his nails.These nails are painful walking and wearing shoes.  This patient presents for at risk foot care today.  General Appearance  Alert, conversant and in no acute stress.  Vascular  Dorsalis pedis and posterior tibial  pulses are palpable  bilaterally.  Capillary return is within normal limits  bilaterally. Temperature is within normal limits  bilaterally.  Neurologic  Senn-Weinstein monofilament wire test within normal limits  bilaterally. Muscle power within normal limits bilaterally.  Nails Thick disfigured discolored nails with subungual debris  from hallux to fifth toes bilaterally. No evidence of bacterial infection or drainage bilaterally.  Orthopedic  No limitations of motion  feet .  No crepitus or effusions noted.  No bony pathology or digital deformities noted.  Skin  normotropic skin with no porokeratosis noted bilaterally.  No signs of infections or ulcers noted.     Onychomycosis  Pain in right toes  Pain in left toes  Consent was obtained for treatment procedures.   Mechanical debridement of nails 1-5  bilaterally performed with a nail nipper.  Filed with dremel without incident.  Visual inspection of the foot was performed.   Return office visit   3 months                  Told patient to return for periodic foot care and evaluation due to potential at risk complications.   Gardiner Barefoot DPM

## 2020-06-26 NOTE — Telephone Encounter (Signed)
-----   Message from Charlott Rakes, MD sent at 06/25/2020  3:41 PM EDT ----- Please inform him his liver enzymes are elevated.  If he consumes alcohol I would need him to work on quitting.  I have also ordered a liver ultrasound if you could please schedule this for him that would be great.  Thank you

## 2020-07-01 ENCOUNTER — Ambulatory Visit (HOSPITAL_COMMUNITY): Payer: Medicare Other

## 2020-07-03 ENCOUNTER — Other Ambulatory Visit: Payer: Self-pay

## 2020-07-03 ENCOUNTER — Ambulatory Visit (HOSPITAL_COMMUNITY)
Admission: RE | Admit: 2020-07-03 | Discharge: 2020-07-03 | Disposition: A | Discharge status: Home / Self Care | Payer: Medicare Other | Source: Ambulatory Visit | Attending: Family Medicine | Admitting: Family Medicine

## 2020-07-03 DIAGNOSIS — K7689 Other specified diseases of liver: Secondary | ICD-10-CM | POA: Diagnosis not present

## 2020-07-03 DIAGNOSIS — R7989 Other specified abnormal findings of blood chemistry: Secondary | ICD-10-CM | POA: Diagnosis not present

## 2020-07-04 ENCOUNTER — Encounter: Payer: Self-pay | Admitting: Family Medicine

## 2020-07-04 ENCOUNTER — Ambulatory Visit: Payer: Medicare Other | Attending: Family Medicine | Admitting: Family Medicine

## 2020-07-04 VITALS — BP 103/65 | HR 99 | Ht 71.0 in | Wt 183.0 lb

## 2020-07-04 DIAGNOSIS — Z23 Encounter for immunization: Secondary | ICD-10-CM | POA: Diagnosis not present

## 2020-07-04 DIAGNOSIS — E785 Hyperlipidemia, unspecified: Secondary | ICD-10-CM | POA: Diagnosis not present

## 2020-07-04 DIAGNOSIS — Z833 Family history of diabetes mellitus: Secondary | ICD-10-CM | POA: Insufficient documentation

## 2020-07-04 DIAGNOSIS — Z79899 Other long term (current) drug therapy: Secondary | ICD-10-CM | POA: Diagnosis not present

## 2020-07-04 DIAGNOSIS — Z7982 Long term (current) use of aspirin: Secondary | ICD-10-CM | POA: Insufficient documentation

## 2020-07-04 DIAGNOSIS — B1920 Unspecified viral hepatitis C without hepatic coma: Secondary | ICD-10-CM | POA: Insufficient documentation

## 2020-07-04 DIAGNOSIS — Z794 Long term (current) use of insulin: Secondary | ICD-10-CM | POA: Diagnosis not present

## 2020-07-04 DIAGNOSIS — Z8349 Family history of other endocrine, nutritional and metabolic diseases: Secondary | ICD-10-CM | POA: Insufficient documentation

## 2020-07-04 DIAGNOSIS — G4709 Other insomnia: Secondary | ICD-10-CM | POA: Insufficient documentation

## 2020-07-04 DIAGNOSIS — Z8249 Family history of ischemic heart disease and other diseases of the circulatory system: Secondary | ICD-10-CM | POA: Diagnosis not present

## 2020-07-04 DIAGNOSIS — R160 Hepatomegaly, not elsewhere classified: Secondary | ICD-10-CM | POA: Diagnosis not present

## 2020-07-04 DIAGNOSIS — K7460 Unspecified cirrhosis of liver: Secondary | ICD-10-CM | POA: Diagnosis not present

## 2020-07-04 DIAGNOSIS — K769 Liver disease, unspecified: Secondary | ICD-10-CM

## 2020-07-04 DIAGNOSIS — E1169 Type 2 diabetes mellitus with other specified complication: Secondary | ICD-10-CM | POA: Insufficient documentation

## 2020-07-04 DIAGNOSIS — I10 Essential (primary) hypertension: Secondary | ICD-10-CM | POA: Insufficient documentation

## 2020-07-04 LAB — POCT GLYCOSYLATED HEMOGLOBIN (HGB A1C): HbA1c, POC (controlled diabetic range): 7.5 % — AB (ref 0.0–7.0)

## 2020-07-04 LAB — GLUCOSE, POCT (MANUAL RESULT ENTRY): POC Glucose: 121 mg/dl — AB (ref 70–99)

## 2020-07-04 MED ORDER — LANTUS SOLOSTAR 100 UNIT/ML ~~LOC~~ SOPN: PEN_INJECTOR | SUBCUTANEOUS | 6 refills | Status: DC

## 2020-07-04 MED ORDER — TRAZODONE HCL 50 MG PO TABS: 50.0000 mg | ORAL_TABLET | Freq: Every evening | ORAL | 6 refills | Status: DC | PRN

## 2020-07-04 MED ORDER — ATORVASTATIN CALCIUM 40 MG PO TABS: 40.0000 mg | ORAL_TABLET | Freq: Every day | ORAL | 6 refills | Status: DC

## 2020-07-04 MED ORDER — METFORMIN HCL 500 MG PO TABS: ORAL_TABLET | ORAL | 6 refills | Status: DC

## 2020-07-04 MED ORDER — LISINOPRIL-HYDROCHLOROTHIAZIDE 20-12.5 MG PO TABS: 1.0000 | ORAL_TABLET | Freq: Every day | ORAL | 6 refills | Status: DC

## 2020-07-04 MED ORDER — AMLODIPINE BESYLATE 10 MG PO TABS: 10.0000 mg | ORAL_TABLET | Freq: Every day | ORAL | 6 refills | Status: DC

## 2020-07-04 MED FILL — LISINOPRIL-HCTZ 20-12.5 MG: 20-12.5 | 30 days supply | Qty: 30 | Fill #0

## 2020-07-04 MED FILL — ?BASAGLAR 100 UNITS/ML KWPE: 100 | 27 days supply | Qty: 18 | Fill #0

## 2020-07-04 MED FILL — ?ATORVASTATIN 40MG TABLET: 40 | 30 days supply | Qty: 30 | Fill #0

## 2020-07-04 MED FILL — ?TRAZODONE HCL 50 TABS: 50 | 30 days supply | Qty: 30 | Fill #0

## 2020-07-04 MED FILL — AMLODIPINE BESYLATE 10 MG T: 10 | 30 days supply | Qty: 30 | Fill #0

## 2020-07-04 MED FILL — METFORMIN HCL 500 MG TABS: 500 | 30 days supply | Qty: 120 | Fill #0

## 2020-07-04 NOTE — Patient Instructions (Signed)
Cirrhosis  Cirrhosis is long-term (chronic) liver injury. The liver is the body's largest internal organ, and it performs many functions. It converts food into energy, removes toxic material from the blood, makes important proteins, and absorbs necessary vitamins from food. In cirrhosis, healthy liver cells are replaced by scar tissue. This prevents blood from flowing through the liver, making it difficult for the liver to function. Scarring of the liver cannot be reversed, but treatment can prevent it from getting worse. What are the causes? Common causes of this condition are hepatitis C and long-term alcohol abuse. Other causes include:  Nonalcoholic fatty liver disease. This happens when fat is deposited in the liver by causes other than alcohol.  Hepatitis B infection.  Autoimmune hepatitis. In this condition, the body's defense system (immune system) mistakenly attacks the liver cells, causing irritation and swelling (inflammation).  Diseases that cause blockage of ducts inside the liver.  Inherited liver diseases, such as hemochromatosis. This is one of the most common inherited liver diseases. In this disease, deposits of iron collect in the liver and other organs.  Reactions to certain long-term medicines, such as amiodarone, a heart medicine.  Parasitic infections. These include schistosomiasis, which is caused by a flatworm.  Long-term contact to certain toxins. These toxins include certain organic solvents, such as toluene and chloroform. What increases the risk? You are more likely to develop this condition if:  You have certain types of viral hepatitis.  You abuse alcohol, especially if you are male.  You are overweight.  You share needles.  You have unprotected sex with someone who has viral hepatitis. What are the signs or symptoms? You may not have any signs and symptoms at first. Symptoms may not develop until the damage to your liver starts to get worse. Early  symptoms may include:  Weakness and tiredness (fatigue).  Changes in sleep patterns or having trouble sleeping.  Itchiness.  Tenderness in the right-upper part of your abdomen.  Weight loss and muscle loss.  Nausea.  Loss of appetite.  Appearance of tiny blood vessels under the skin. Later symptoms may include:  Fatigue or weakness that is getting worse.  Yellow skin and eyes (jaundice).  Buildup of fluid in the abdomen (ascites). You may notice that your clothes are tight around your waist.  Weight gain.  Swelling of the feet and ankles (edema).  Trouble breathing.  Easy bruising and bleeding.  Vomiting blood.  Black or bloody stool.  Mental confusion. How is this diagnosed? Your health care provider may suspect cirrhosis based on your symptoms and medical history, especially if you have other medical conditions or a history of alcohol abuse. Your health care provider will do a physical exam to feel your liver and to check for signs of cirrhosis. He or she may perform other tests, including:  Blood tests to check: ? For hepatitis B or C. ? Kidney function. ? Liver function.  Imaging tests such as: ? MRI or CT scan to look for changes seen in advanced cirrhosis. ? Ultrasound to see if normal liver tissue is being replaced by scar tissue.  A procedure in which a long needle is used to take a sample of liver tissue to be checked in a lab (biopsy). Liver biopsy can confirm the diagnosis of cirrhosis. How is this treated? Treatment for this condition depends on how damaged your liver is and what caused the damage. It may include treating the symptoms of cirrhosis, or treating the underlying causes in order to   slow the damage. Treatment may include:  Making lifestyle changes, such as: ? Eating a healthy diet. You may need to work with your health care provider or a diet and nutrition specialist (dietitian) to develop an eating plan. ? Restricting salt  intake. ? Maintaining a healthy weight. ? Not abusing drugs or alcohol.  Taking medicines to: ? Treat liver infections or other infections. ? Control itching. ? Reduce fluid buildup. ? Reduce certain blood toxins. ? Reduce risk of bleeding from enlarged blood vessels in the stomach or esophagus (varices).  Liver transplant. In this procedure, a liver from a donor is used to replace your diseased liver. This is done if cirrhosis has caused liver failure. Other treatments and procedures may be done depending on the problems that you get from cirrhosis. Common problems include liver-related kidney failure (hepatorenal syndrome). Follow these instructions at home:   Take medicines only as told by your health care provider. Do not use medicines that are toxic to your liver. Ask your health care provider before taking any new medicines, including over-the-counter medicines.  Rest as needed.  Eat a well-balanced diet. Ask your health care provider or dietitian for more information.  Limit your salt or water intake, if your health care provider asks you to do this.  Do not drink alcohol. This is especially important if you are taking acetaminophen.  Keep all follow-up visits as told by your health care provider. This is important. Contact a health care provider if you:  Have fatigue or weakness that is getting worse.  Develop swelling of the hands, feet, legs, or face.  Have a fever.  Develop loss of appetite.  Have nausea or vomiting.  Develop jaundice.  Develop easy bruising or bleeding. Get help right away if you:  Vomit bright red blood or a material that looks like coffee grounds.  Have blood in your stools.  Notice that your stools appear black and tarry.  Become confused.  Have chest pain or trouble breathing. Summary  Cirrhosis is chronic liver injury. Liver damage cannot be reversed. Common causes are hepatitis C and long-term alcohol abuse.  Tests used to  diagnose cirrhosis include blood tests, imaging tests, and liver biopsy.  Treatment for this condition involves treating the underlying cause. Avoid alcohol, drugs, salt, and medicines that may damage your liver.  Contact your health care provider if you develop ascites, edema, jaundice, fever, nausea or vomiting, easy bruising or bleeding, or worsening fatigue. This information is not intended to replace advice given to you by your health care provider. Make sure you discuss any questions you have with your health care provider. Document Revised: 02/08/2019 Document Reviewed: 09/08/2017 Elsevier Patient Education  2020 [employer]  

## 2020-07-04 NOTE — Progress Notes (Signed)
Subjective:  Patient ID: Jesse Wilcox, male    DOB: 1951-06-08  Age: 69 y.o. MRN: [id number]  CC: Diabetes   HPI Jesse Wilcox  is a 69 year old male with a history of uncontrolled type 2 diabetes mellitus (A1c7.5), hypertension, hyperlipidemia, hepatitis C (treated), Liver Cirrhosis who comes in for a follow-up visit.  With regards to his diabetes mellitus, he denies presence of hypoglycemia, blurry vision and has not had a recent eye exam, denies presence of neuropathy.  Glucometer readings reviewed: 30 day average - 104; 14 day average - 101, 7 day average - 91  His comprehensive metabolic panel had revealed elevated LFTs and he stated he has not drank alcohol in 23 years. Abdominal ultrasound ordered which revealed: IMPRESSION: Liver contour nodularity and coarsened echotexture concerning for cirrhosis. There is a masslike area in the left hepatic lobe measuring up to 7.7 cm which is indeterminate. Liver MRI is recommended for further evaluation.  Spoke with his sister Jesse Wilcox on the phone regarding findings per patient request. He has no additional concerns today.  Past Medical History:  Diagnosis Date  . Carotid artery occlusion   . Chronic hepatitis C ([place])   . Diabetes mellitus     Past Surgical History:  Procedure Laterality Date  . Aortic arch angiogram  04-15-12   By Dr. Einar Gip  . ARCH AORTOGRAM  04/15/2012   Procedure: ARCH AORTOGRAM;  Surgeon: Laverda Page, MD;  Location: [facility] CATH LAB;  Service: Cardiovascular;;  . CAROTID ANGIOGRAM N/A 04/15/2012   Procedure: CAROTID ANGIOGRAM;  Surgeon: Laverda Page, MD;  Location: [facility] CATH LAB;  Service: Cardiovascular;  Laterality: N/A;  . CAROTID STENT INSERTION N/A 08/23/2012   Procedure: CAROTID STENT INSERTION;  Surgeon: Elam Dutch, MD;  Location: [facility] CATH LAB;  Service: Cardiovascular;  Laterality: N/A;    Family History  Problem Relation Age of Onset  . Diabetes Mother   . Heart disease Mother   .  Hypertension Mother   . Heart attack Mother   . Diabetes Father   . Heart disease Father   . Hypertension Father   . Diabetes Sister   . Hyperlipidemia Sister   . Heart disease Sister   . Heart attack Sister   . Peripheral vascular disease Sister   . Hyperlipidemia Brother   . Hypertension Brother     No Known Allergies  Outpatient Medications Prior to Visit  Medication Sig Dispense Refill  . aspirin EC 81 MG tablet Take 1 tablet (81 mg total) by mouth daily. 30 tablet 3  . Blood Glucose Monitoring Suppl (TRUE METRIX METER) w/Device KIT Use to check blood sugar as directed 1 kit 0  . docusate sodium (COLACE) 100 MG capsule Take 1 capsule (100 mg total) by mouth every 12 (twelve) hours. 20 capsule 0  . gabapentin (NEURONTIN) 300 MG capsule Take 1 capsule (300 mg total) by mouth 3 (three) times daily. 90 capsule 0  . glucose blood test strip Test 3 times daily. 100 each 12  . HYDROcodone-acetaminophen (NORCO) 7.5-325 MG tablet Take 1 tablet by mouth every 6 (six) hours as needed for moderate pain. 20 tablet 0  . Insulin Pen Needle (TRUEPLUS PEN NEEDLES) 31G X 8 MM MISC USE AS DIRECTED 4 TIMES DAILY AFTER MEALS AND AT BEDTIME 100 each 2  . Miconazole POWD Applied to area of irritation around the foreskin twice daily 1 Bottle 2  . Multiple Vitamin (MULTIVITAMIN WITH MINERALS) TABS Take 1 tablet by mouth  daily.    . TRUEplus Lancets 28G MISC Use as directed 100 each 12  . amLODipine (NORVASC) 10 MG tablet Take 1 tablet (10 mg total) by mouth daily. Must have office visit for refills 90 tablet 2  . atorvastatin (LIPITOR) 40 MG tablet Take 1 tablet (40 mg total) by mouth daily. 90 tablet 2  . insulin glargine (LANTUS SOLOSTAR) 100 UNIT/ML Solostar Pen INJECT 65 UNITS INTO THE SKIN DAILY. 18 mL 3  . lisinopril-hydrochlorothiazide (ZESTORETIC) 20-12.5 MG tablet Take 1 tablet by mouth daily. 30 tablet 3  . metFORMIN (GLUCOPHAGE) 500 MG tablet TAKE 2 TABLETS BY MOUTH 2 TIMES DAILY WITH A MEAL  120 tablet 3  . traZODone (DESYREL) 50 MG tablet Take 1 tablet (50 mg total) by mouth at bedtime as needed for sleep. 30 tablet 3   No facility-administered medications prior to visit.     ROS Review of Systems  Constitutional: Negative for activity change and appetite change.  HENT: Negative for sinus pressure and sore throat.   Eyes: Negative for visual disturbance.  Respiratory: Negative for cough, chest tightness and shortness of breath.   Cardiovascular: Negative for chest pain and leg swelling.  Gastrointestinal: Negative for abdominal distention, abdominal pain, constipation and diarrhea.  Endocrine: Negative.   Genitourinary: Negative for dysuria.  Musculoskeletal: Negative for joint swelling and myalgias.  Skin: Negative for rash.  Allergic/Immunologic: Negative.   Neurological: Negative for weakness, light-headedness and numbness.  Psychiatric/Behavioral: Negative for dysphoric mood and suicidal ideas.    Objective:  BP 103/65   Pulse 99   Ht _0  (1.803 m)   Wt 183 lb (83 kg)   SpO2 97%   BMI 25.52 kg/m   BP/Weight 07/04/2020 06/07/2020 1/61/0960  Systolic BP 454 098 119  Diastolic BP 65 54 74  Wt. (Lbs) 183 - 197  BMI 25.52 - 27.48  Some encounter information is confidential and restricted. Go to Review Flowsheets activity to see all data.      Physical Exam Constitutional:      Appearance: He is well-developed.  Neck:     Vascular: No JVD.  Cardiovascular:     Rate and Rhythm: Normal rate.     Heart sounds: Normal heart sounds. No murmur heard.   Pulmonary:     Effort: Pulmonary effort is normal.     Breath sounds: Normal breath sounds. No wheezing or rales.  Chest:     Chest wall: No tenderness.  Abdominal:     General: Bowel sounds are normal. There is no distension.     Palpations: Abdomen is soft. There is no mass.     Tenderness: There is no abdominal tenderness.  Musculoskeletal:        General: Normal range of motion.     Right lower  leg: No edema.     Left lower leg: No edema.  Neurological:     Mental Status: He is alert and oriented to person, place, and time.  Psychiatric:        Mood and Affect: Mood normal.     CMP Latest Ref Rng & Units 06/21/2020 07/03/2019 11/09/2018  Glucose 65 - 99 mg/dL 149(H) 171(H) 196(H)  BUN 8 - 27 mg/dL _1 Creatinine 0.76 - 1.27 mg/dL 0.81 0.91 0.92  Sodium 134 - 144 mmol/L 136 142 137  Potassium 3.5 - 5.2 mmol/L 4.7 4.4 4.3  Chloride 96 - 106 mmol/L 101 102 98  CO2 20 - 29 mmol/L 23 22 23  Calcium 8.6 - 10.2 mg/dL 8.9 9.8 9.8  Total Protein 6.0 - 8.5 g/dL 8.4 7.5 7.6  Total Bilirubin 0.0 - 1.2 mg/dL 0.8 0.7 0.6  Alkaline Phos 48 - 121 IU/L 325(H) 120(H) 116  AST 0 - 40 IU/L 41(H) 26 20  ALT 0 - 44 IU/L 71(H) 44 43    Lipid Panel     Component Value Date/Time   CHOL 120 06/21/2020 0925   TRIG 103 06/21/2020 0925   HDL 24 (L) 06/21/2020 0925   CHOLHDL 5.0 06/21/2020 0925   CHOLHDL 4.1 04/24/2016 1033   VLDL 38 (H) 04/24/2016 1033   LDLCALC 76 06/21/2020 0925   LDLDIRECT 140.6 08/08/2014 1024    CBC    Component Value Date/Time   WBC 10.3 (A) 04/22/2015 0859   WBC 6.6 01/09/2009 0909   RBC 5.61 04/22/2015 0859   RBC 5.12 01/09/2009 0909   HGB 15.6 04/22/2015 0859   HGB 16.3 08/23/2012 0619   HCT 47.9 04/22/2015 0859   HCT 48.0 08/23/2012 0619   PLT 162 01/09/2009 0909   MCV 85.4 04/22/2015 0859   MCH 27.8 04/22/2015 0859   MCHC 32.5 04/22/2015 0859   MCHC 34.3 01/09/2009 0909   RDW 13.9 01/09/2009 0909    Lab Results  Component Value Date   HGBA1C 7.5 (A) 07/04/2020    Assessment & Plan:  1. Type 2 diabetes mellitus with other specified complication, with long-term current use of insulin (HCC) Controlled with A1c of 7.5; goal is less than 7.5 Continue current management Counseled on Diabetic diet, my plate method, 694 minutes of moderate intensity exercise/week Blood sugar logs with fasting goals of 80-120 mg/dl, random of less than 180 and in  the event of sugars less than 60 mg/dl or greater than 400 mg/dl encouraged to notify the clinic. Advised on the need for annual eye exams, annual foot exams, Pneumonia vaccine. - POCT glucose (manual entry) - POCT glycosylated hemoglobin (Hb A1C) - atorvastatin (LIPITOR) 40 MG tablet; Take 1 tablet (40 mg total) by mouth daily.  Dispense: 30 tablet; Refill: 6 - insulin glargine (LANTUS SOLOSTAR) 100 UNIT/ML Solostar Pen; INJECT 65 UNITS INTO THE SKIN DAILY.  Dispense: 18 mL; Refill: 6 - metFORMIN (GLUCOPHAGE) 500 MG tablet; TAKE 2 TABLETS BY MOUTH 2 TIMES DAILY WITH A MEAL  Dispense: 120 tablet; Refill: 6  2. Cirrhosis of liver without ascites, unspecified hepatic cirrhosis type ([place]) Possibly secondary to history of hepatitis C He currently does not drink alcohol  3. Liver mass Incidental finding on liver ultrasound - MR Abdomen W Wo Contrast; Future  4. Liver disease See #3 above - MR Abdomen W Wo Contrast; Future  5. Essential hypertension Controlled Counseled on blood pressure goal of less than 130/80, low-sodium, DASH diet, medication compliance, 150 minutes of moderate intensity exercise per week. Discussed medication compliance, adverse effects. - amLODipine (NORVASC) 10 MG tablet; Take 1 tablet (10 mg total) by mouth daily.  Dispense: 30 tablet; Refill: 6 - lisinopril-hydrochlorothiazide (ZESTORETIC) 20-12.5 MG tablet; Take 1 tablet by mouth daily.  Dispense: 30 tablet; Refill: 6  6. Other insomnia Stable - traZODone (DESYREL) 50 MG tablet; Take 1 tablet (50 mg total) by mouth at bedtime as needed for sleep.  Dispense: 30 tablet; Refill: 6  7. Need for immunization against influenza - Flu Vaccine QUAD 36+ mos IM     Meds ordered this encounter  Medications  . amLODipine (NORVASC) 10 MG tablet    Sig: Take 1 tablet (10 mg total)  by mouth daily.    Dispense:  30 tablet    Refill:  6  . atorvastatin (LIPITOR) 40 MG tablet    Sig: Take 1 tablet (40 mg total) by mouth  daily.    Dispense:  30 tablet    Refill:  6  . insulin glargine (LANTUS SOLOSTAR) 100 UNIT/ML Solostar Pen    Sig: INJECT 65 UNITS INTO THE SKIN DAILY.    Dispense:  18 mL    Refill:  6  . lisinopril-hydrochlorothiazide (ZESTORETIC) 20-12.5 MG tablet    Sig: Take 1 tablet by mouth daily.    Dispense:  30 tablet    Refill:  6  . metFORMIN (GLUCOPHAGE) 500 MG tablet    Sig: TAKE 2 TABLETS BY MOUTH 2 TIMES DAILY WITH A MEAL    Dispense:  120 tablet    Refill:  6  . traZODone (DESYREL) 50 MG tablet    Sig: Take 1 tablet (50 mg total) by mouth at bedtime as needed for sleep.    Dispense:  30 tablet    Refill:  6    Follow-up: Return in about 3 months (around 10/03/2020) for Chronic disease management.       Charlott Rakes, MD, FAAFP. [facility] and [place], [place]   07/04/2020, 12:45 PM

## 2020-07-05 ENCOUNTER — Ambulatory Visit: Payer: Medicare Other | Admitting: Pharmacist

## 2020-07-17 ENCOUNTER — Ambulatory Visit (HOSPITAL_COMMUNITY)
Admission: RE | Admit: 2020-07-17 | Discharge: 2020-07-17 | Disposition: A | Discharge status: Home / Self Care | Payer: Medicare Other | Source: Ambulatory Visit | Attending: Family Medicine | Admitting: Family Medicine

## 2020-07-17 ENCOUNTER — Other Ambulatory Visit: Payer: Self-pay

## 2020-07-17 DIAGNOSIS — R160 Hepatomegaly, not elsewhere classified: Secondary | ICD-10-CM | POA: Diagnosis not present

## 2020-07-17 DIAGNOSIS — K7460 Unspecified cirrhosis of liver: Secondary | ICD-10-CM | POA: Diagnosis not present

## 2020-07-17 DIAGNOSIS — K769 Liver disease, unspecified: Secondary | ICD-10-CM

## 2020-07-17 DIAGNOSIS — I739 Peripheral vascular disease, unspecified: Secondary | ICD-10-CM | POA: Diagnosis not present

## 2020-07-17 DIAGNOSIS — K766 Portal hypertension: Secondary | ICD-10-CM | POA: Diagnosis not present

## 2020-07-17 DIAGNOSIS — K7689 Other specified diseases of liver: Secondary | ICD-10-CM | POA: Diagnosis not present

## 2020-07-17 MED ORDER — GADOBUTROL 1 MMOL/ML IV SOLN
8.0000 mL | Freq: Once | INTRAVENOUS | Status: AC | PRN
  Administered 2020-07-17: 8 mL via INTRAVENOUS

## 2020-07-18 ENCOUNTER — Other Ambulatory Visit: Payer: Self-pay | Admitting: Family Medicine

## 2020-07-18 DIAGNOSIS — C220 Liver cell carcinoma: Secondary | ICD-10-CM

## 2020-07-19 ENCOUNTER — Telehealth: Payer: Self-pay

## 2020-07-19 NOTE — Telephone Encounter (Signed)
Patient was informed of lab results when he visited the clinic today.

## 2020-07-19 NOTE — Telephone Encounter (Signed)
Called pt left msg advising pt will have to pick up any imaging reports at the facility he had the radiology scans done. PCP not able to release reports to pt.

## 2020-07-23 ENCOUNTER — Telehealth: Payer: Self-pay | Admitting: Oncology

## 2020-07-23 NOTE — Progress Notes (Signed)
Left voice message for patient regarding referral we received from Dr. Margarita Rana for liver cancer.  I left my direct number for him to call me back.  Would like to offer him an appointment with Dr. Julieanne Manson on Monday 9/27 at 2 pm.

## 2020-07-23 NOTE — Telephone Encounter (Signed)
Received a call from Mr. [place] sister regarding a oncology appt. Mr. Panebianco is scheduled to see Dr. Benay Spice on 9/27 at 2pm. I lft the pt's sister the appt date and time on her vm.

## 2020-07-24 NOTE — Progress Notes (Signed)
Attempted to reach patient again regarding referral to medical oncology.  Left voice message with appointment for Monday 9/27 at 2 pm to arrive no later than 1:45 with Dr. Julieanne Manson.  I requested he call me back to acknowledge this. I provided my direct number for the return call.   Left a voice message for patient's sister Allyne Gee regarding his scheduled appointment.  I asked that she call me to confirm.

## 2020-07-29 ENCOUNTER — Inpatient Hospital Stay: Payer: Medicare Other | Attending: Oncology | Admitting: Oncology

## 2020-07-29 ENCOUNTER — Inpatient Hospital Stay: Payer: Medicare Other

## 2020-07-29 ENCOUNTER — Other Ambulatory Visit: Payer: Self-pay

## 2020-07-29 ENCOUNTER — Telehealth: Payer: Self-pay

## 2020-07-29 ENCOUNTER — Telehealth: Payer: Self-pay | Admitting: Oncology

## 2020-07-29 VITALS — BP 116/70 | HR 100 | Temp 98.0°F | Resp 18 | Ht 71.0 in | Wt 174.1 lb

## 2020-07-29 DIAGNOSIS — F17210 Nicotine dependence, cigarettes, uncomplicated: Secondary | ICD-10-CM

## 2020-07-29 DIAGNOSIS — K7460 Unspecified cirrhosis of liver: Secondary | ICD-10-CM

## 2020-07-29 DIAGNOSIS — C779 Secondary and unspecified malignant neoplasm of lymph node, unspecified: Secondary | ICD-10-CM

## 2020-07-29 DIAGNOSIS — F1021 Alcohol dependence, in remission: Secondary | ICD-10-CM

## 2020-07-29 DIAGNOSIS — R634 Abnormal weight loss: Secondary | ICD-10-CM

## 2020-07-29 DIAGNOSIS — C220 Liver cell carcinoma: Secondary | ICD-10-CM | POA: Diagnosis not present

## 2020-07-29 DIAGNOSIS — C7989 Secondary malignant neoplasm of other specified sites: Secondary | ICD-10-CM | POA: Diagnosis not present

## 2020-07-29 DIAGNOSIS — R630 Anorexia: Secondary | ICD-10-CM

## 2020-07-29 DIAGNOSIS — I81 Portal vein thrombosis: Secondary | ICD-10-CM

## 2020-07-29 DIAGNOSIS — K766 Portal hypertension: Secondary | ICD-10-CM

## 2020-07-29 DIAGNOSIS — Z803 Family history of malignant neoplasm of breast: Secondary | ICD-10-CM

## 2020-07-29 DIAGNOSIS — E785 Hyperlipidemia, unspecified: Secondary | ICD-10-CM

## 2020-07-29 LAB — CMP (CANCER CENTER ONLY)
ALT: 120 U/L — ABNORMAL HIGH (ref 0–44)
AST: 66 U/L — ABNORMAL HIGH (ref 15–41)
Albumin: 2.3 g/dL — ABNORMAL LOW (ref 3.5–5.0)
Alkaline Phosphatase: 465 U/L — ABNORMAL HIGH (ref 38–126)
Anion gap: 2 — ABNORMAL LOW (ref 5–15)
BUN: 8 mg/dL (ref 8–23)
CO2: 32 mmol/L (ref 22–32)
Calcium: 9.2 mg/dL (ref 8.9–10.3)
Chloride: 100 mmol/L (ref 98–111)
Creatinine: 0.89 mg/dL (ref 0.61–1.24)
GFR, Est AFR Am: >60 mL/min (ref >60)
GFR, Estimated: >60 mL/min (ref >60)
Glucose, Bld: 123 mg/dL — ABNORMAL HIGH (ref 70–99)
Potassium: 4.3 mmol/L (ref 3.5–5.1)
Sodium: 134 mmol/L — ABNORMAL LOW (ref 135–145)
Total Bilirubin: 0.8 mg/dL (ref 0.3–1.2)
Total Protein: 9.9 g/dL — ABNORMAL HIGH (ref 6.5–8.1)

## 2020-07-29 LAB — CBC WITH DIFFERENTIAL (CANCER CENTER ONLY)
Abs Immature Granulocytes: 0.08 10*3/uL — ABNORMAL HIGH (ref 0.00–0.07)
Basophils Absolute: 0.2 10*3/uL — ABNORMAL HIGH (ref 0.0–0.1)
Basophils Relative: 1 %
Eosinophils Absolute: 2.4 10*3/uL — ABNORMAL HIGH (ref 0.0–0.5)
Eosinophils Relative: 16 %
HCT: 41.4 % (ref 39.0–52.0)
Hemoglobin: 13.4 g/dL (ref 13.0–17.0)
Immature Granulocytes: 1 %
Lymphocytes Relative: 21 %
Lymphs Abs: 3.1 10*3/uL (ref 0.7–4.0)
MCH: 26.6 pg (ref 26.0–34.0)
MCHC: 32.4 g/dL (ref 30.0–36.0)
MCV: 82.1 fL (ref 80.0–100.0)
Monocytes Absolute: 1.2 10*3/uL — ABNORMAL HIGH (ref 0.1–1.0)
Monocytes Relative: 8 %
Neutro Abs: 8 10*3/uL — ABNORMAL HIGH (ref 1.7–7.7)
Neutrophils Relative %: 53 %
Platelet Count: 427 10*3/uL — ABNORMAL HIGH (ref 150–400)
RBC: 5.04 MIL/uL (ref 4.22–5.81)
RDW: 14.6 % (ref 11.5–15.5)
WBC Count: 15 10*3/uL — ABNORMAL HIGH (ref 4.0–10.5)
nRBC: 0 % (ref 0.0–0.2)

## 2020-07-29 LAB — PROTIME-INR
INR: 1.2 (ref 0.8–1.2)
Prothrombin Time: 14.6 seconds (ref 11.4–15.2)

## 2020-07-29 NOTE — Telephone Encounter (Signed)
Spoke to patient after multiple attempts.  He is aware of his appointment and plans to be here this afternoon.  I asked him to get here no later than 1:45 and I confirmed he knows our location.

## 2020-07-29 NOTE — Progress Notes (Signed)
Patient reports he has had both COVID vaccines. Will bring card w/next visit.

## 2020-07-29 NOTE — Progress Notes (Signed)
Met with patient and his sister [name] today at his initial medical oncology consult with Dr. Julieanne Manson.  I explained my role as nurse navigator and she was given my card with my direct contact information.  They verbalized an understanding of the plan of care to obtain biopsy of chest wall mass and CT of chest, abdomen and pelvis.  They know they will receive a call when these are scheduled.

## 2020-07-29 NOTE — Progress Notes (Signed)
[place] Patient Consult   Requesting MD: Jesse Rakes, [address],  [place] [address]   Jesse Wilcox 69 y.o.  Oct 02, 1951    Reason for Consult: Liver mass   HPI: Jesse Wilcox has a history of hepatitis C and cirrhosis.  He has a remote history of alcohol use.  He underwent treatment for hepatitis C with "Harvoni ".  He was recently found to have abnormal liver enzymes by his primary provider and was referred for an abdominal ultrasound on 07/03/2020.  A large masslike area was noted in the left hepatic lobe with liver changes of cirrhosis.  The portal vein was patent.  A liver MRI on 07/18/2020 revealed a mass at the left lower chest wall.  There are enlarged retrocrural and posterior mediastinal nodes.  A large infiltrative mass is noted in the left liver with signs of portal venous tumor thrombus.  Small lesions are noted in the right liver.  Bulky upper abdominal lymphadenopathy is a cirrhosis, splenomegaly, and a necrotic left retroperitoneal lymph node.  He has lost a significant amount of weight over the past 6 months.  No other complaint.  Past Medical History:  Diagnosis Date  . Carotid artery occlusion   .  Hepatitis C   . Diabetes mellitus     .  Hypertension   .  Hyperlipidemia  Past Surgical History:  Procedure Laterality Date  . Aortic arch angiogram  04-15-12   By Dr. Einar Gip  . ARCH AORTOGRAM  04/15/2012   Procedure: ARCH AORTOGRAM;  Surgeon: Laverda Page, MD;  Location: [facility] CATH LAB;  Service: Cardiovascular;;  . CAROTID ANGIOGRAM N/A 04/15/2012   Procedure: CAROTID ANGIOGRAM;  Surgeon: Laverda Page, MD;  Location: [facility] CATH LAB;  Service: Cardiovascular;  Laterality: N/A;  . CAROTID STENT INSERTION N/A 08/23/2012   Procedure: CAROTID STENT INSERTION;  Surgeon: Elam Dutch, MD;  Location: [facility] CATH LAB;  Service: Cardiovascular;  Laterality: N/A;    Medications: Reviewed  Allergies: No Known  Allergies  Family history: A cousin had breast cancer  Social History:   He lives alone in [place].  He is retired from Water engineer at [employer].  He smokes 1 pack of cigarettes per day.  No alcohol in 23 years.  He was transfused while in the hospital many years ago.  ROS:   Positives include: Anorexia, 35 pound weight loss  A complete ROS was otherwise negative.  Physical Exam:  Blood pressure 116/70, pulse 100, temperature 98 F (36.7 C), temperature source Tympanic, resp. rate 18, height 5\' 11"  (1.803 m), weight 174 lb 1.6 oz (79 kg), SpO2 99 %.  Lungs: Clear bilaterally Cardiac: Regular rate and rhythm Abdomen: Mild fullness in the right medial subcostal area, no discrete mass, no splenomegaly GU: Uncircumcised male, testes without mass Vascular: No leg edema Lymph nodes: No cervical, supraclavicular, axillary, or inguinal nodes Neurologic: Alert and oriented, the motor exam appears intact in the upper and lower extremities bilaterally Skin: No rash Musculoskeletal: No spine tenderness   LAB:  CBC  Lab Results  Component Value Date   WBC 10.3 (A) 04/22/2015   HGB 15.6 04/22/2015   HCT 47.9 04/22/2015   MCV 85.4 04/22/2015   PLT 162 01/09/2009        CMP  Lab Results  Component Value Date   NA 136 06/21/2020   K 4.7 06/21/2020   CL 101 06/21/2020   CO2 23 06/21/2020   GLUCOSE 149 (  H) 06/21/2020   BUN 10 06/21/2020   CREATININE 0.81 06/21/2020   CALCIUM 8.9 06/21/2020   PROT 8.4 06/21/2020   ALBUMIN 3.6 (L) 06/21/2020   AST 41 (H) 06/21/2020   ALT 71 (H) 06/21/2020   ALKPHOS 325 (H) 06/21/2020   BILITOT 0.8 06/21/2020   GFRNONAA 91 06/21/2020   GFRAA 106 06/21/2020      Imaging: MRI abdomen 07/17/2020-images reviewed with Jesse Wilcox and his sister   Assessment/Plan:   1. Hepatocellular carcinoma  MRI abdomen 07/17/2020-large infiltrative mass in the left hepatic lobe characterized as Li-Rads M based on lack of a  well-defined lesions, meeting LI-RADS 5 criteria, small right hepatic lesions, chest wall and nodal metastatic disease, changes of cirrhosis with portal hypertension and left portal vein tumor thrombus 2. Cirrhosis 3. History of hepatitis C-treated 4. Remote history of alcohol use 5. Hypertension 6. Hyperlipidemia 7. Anorexia/weight loss   Disposition:   Jesse Wilcox is referred for oncology evaluation after a liver ultrasound and MRI confirmed an infiltrative left liver mass.  Small right liver masses, abdominal/retroperitoneal lymphadenopathy, and a left chest wall mass were noted on the diagnostic MRI.  The clinical presentation is consistent with a diagnosis of metastatic hepatocellular carcinoma.  He will be referred for a biopsy of the chest wall mass to confirm the diagnosis of metastatic disease.  Jesse Wilcox will return to the lab today for an AFP level.  He will be referred for staging CTs.  He will return for an office visit next week to discuss the CT and biopsy findings.  He will not be a candidate for surgical hepatic directed therapy.  We will discuss systemic treatment options if a diagnosis of metastatic hepatocellular carcinoma is confirmed.   Betsy Coder, MD  07/29/2020, 2:22 PM

## 2020-07-29 NOTE — Telephone Encounter (Signed)
Scheduled appointment per 9/27 los. Spoke to patient who is aware of upcoming appointment. Gave patient calendar print out.

## 2020-07-30 ENCOUNTER — Telehealth: Payer: Self-pay | Admitting: Oncology

## 2020-07-30 LAB — AFP TUMOR MARKER: AFP, Serum, Tumor Marker: 39.2 ng/mL — ABNORMAL HIGH (ref 0.0–8.3)

## 2020-07-30 NOTE — Telephone Encounter (Signed)
Scheduled appointment per 9/27 los. Called patient, no answer. Left message for patient with appointment date and time.

## 2020-07-30 NOTE — Progress Notes (Signed)
Left message for patient and his sister [name] on her cell phone regarding CT CAP scheduled for this Monday 08/05/2020 has to arrive at [facility] by 7:15 am, NPO 4 hours prior to scan, drink one bottle of oral prep at 5:30 am and the 2nd bottle at 6:30 am same day prior to the scan.  I have left the oral prep at the receptionist desk at [facility] for pick up.  I have asked them both to call me back regarding this appointment.  My direct number was left for patient and his sister.

## 2020-08-01 ENCOUNTER — Encounter (HOSPITAL_COMMUNITY): Payer: Self-pay

## 2020-08-01 NOTE — Progress Notes (Unsigned)
Jesse Wilcox. [place] Male, 69 y.o., December 27, 1950 MRN:  [id number] Phone:  [phone] [name], MD Coverage:  Medicare/Medicare Part A And B Next Appt With Radiology ([facility] 1) 08/05/2020 at 7:30 AM  RE: Biopsy Received: 2 days ago Message Details  Corrie Mckusick, DO  [name] [place] for US guided liver mass biopsy, left liver.    HCC vs cholangio. AFP only slightly elevated. MRI not diagnostic.   [name]   Previous Messages  ----- Message -----  From: [name]  Sent: 07/30/2020 12:17 PM EDT  To: [facility] Procedure Requests  Subject: Biopsy                      Procedure Requested: [place] CORE BIOPSY (SOFT TISSUE)    Reason for Procedure: cirrhosis, liver, chest wall masses    Provider Requesting: Ladell Pier  Provider Telephone: [phone]    Other Info: Biopsy chest wall mass , liver mass biopsy if chest mass not amenable to biopsy

## 2020-08-05 ENCOUNTER — Other Ambulatory Visit: Payer: Self-pay

## 2020-08-05 ENCOUNTER — Ambulatory Visit (HOSPITAL_COMMUNITY)
Admission: RE | Admit: 2020-08-05 | Discharge: 2020-08-05 | Disposition: A | Discharge status: Home / Self Care | Payer: Medicare Other | Source: Ambulatory Visit | Attending: Oncology | Admitting: Oncology

## 2020-08-05 DIAGNOSIS — J432 Centrilobular emphysema: Secondary | ICD-10-CM | POA: Diagnosis not present

## 2020-08-05 DIAGNOSIS — I2510 Atherosclerotic heart disease of native coronary artery without angina pectoris: Secondary | ICD-10-CM | POA: Diagnosis not present

## 2020-08-05 DIAGNOSIS — B1920 Unspecified viral hepatitis C without hepatic coma: Secondary | ICD-10-CM | POA: Diagnosis not present

## 2020-08-05 DIAGNOSIS — C220 Liver cell carcinoma: Secondary | ICD-10-CM | POA: Diagnosis not present

## 2020-08-05 DIAGNOSIS — K7460 Unspecified cirrhosis of liver: Secondary | ICD-10-CM | POA: Diagnosis not present

## 2020-08-05 DIAGNOSIS — I700 Atherosclerosis of aorta: Secondary | ICD-10-CM | POA: Diagnosis not present

## 2020-08-05 MED ORDER — IOHEXOL 300 MG/ML  SOLN
100.0000 mL | Freq: Once | INTRAMUSCULAR | Status: AC | PRN
  Administered 2020-08-05: 100 mL via INTRAVENOUS

## 2020-08-06 ENCOUNTER — Other Ambulatory Visit: Payer: Self-pay | Admitting: Radiology

## 2020-08-06 MED FILL — TRUE METRIX TEST STRIP: 30 days supply | Qty: 100 | Fill #1

## 2020-08-07 ENCOUNTER — Other Ambulatory Visit: Payer: Self-pay

## 2020-08-07 ENCOUNTER — Other Ambulatory Visit (HOSPITAL_COMMUNITY): Payer: Self-pay

## 2020-08-07 ENCOUNTER — Ambulatory Visit (HOSPITAL_COMMUNITY)
Admission: RE | Admit: 2020-08-07 | Discharge: 2020-08-07 | Disposition: A | Discharge status: Home / Self Care | Payer: Medicare Other | Source: Ambulatory Visit | Attending: Oncology | Admitting: Oncology

## 2020-08-07 ENCOUNTER — Encounter (HOSPITAL_COMMUNITY): Payer: Self-pay

## 2020-08-07 ENCOUNTER — Other Ambulatory Visit: Payer: Self-pay | Admitting: Oncology

## 2020-08-07 ENCOUNTER — Other Ambulatory Visit (HOSPITAL_COMMUNITY): Payer: Self-pay | Admitting: Family Medicine

## 2020-08-07 DIAGNOSIS — C220 Liver cell carcinoma: Secondary | ICD-10-CM

## 2020-08-07 DIAGNOSIS — K7460 Unspecified cirrhosis of liver: Secondary | ICD-10-CM | POA: Insufficient documentation

## 2020-08-07 DIAGNOSIS — C7951 Secondary malignant neoplasm of bone: Secondary | ICD-10-CM | POA: Diagnosis not present

## 2020-08-07 DIAGNOSIS — R222 Localized swelling, mass and lump, trunk: Secondary | ICD-10-CM | POA: Diagnosis not present

## 2020-08-07 DIAGNOSIS — R590 Localized enlarged lymph nodes: Secondary | ICD-10-CM | POA: Insufficient documentation

## 2020-08-07 DIAGNOSIS — J432 Centrilobular emphysema: Secondary | ICD-10-CM | POA: Insufficient documentation

## 2020-08-07 DIAGNOSIS — C801 Malignant (primary) neoplasm, unspecified: Secondary | ICD-10-CM | POA: Diagnosis not present

## 2020-08-07 DIAGNOSIS — C771 Secondary and unspecified malignant neoplasm of intrathoracic lymph nodes: Secondary | ICD-10-CM | POA: Diagnosis not present

## 2020-08-07 LAB — CBC
HCT: 42.1 % (ref 39.0–52.0)
Hemoglobin: 13.1 g/dL (ref 13.0–17.0)
MCH: 25.8 pg — ABNORMAL LOW (ref 26.0–34.0)
MCHC: 31.1 g/dL (ref 30.0–36.0)
MCV: 82.9 fL (ref 80.0–100.0)
Platelets: 497 10*3/uL — ABNORMAL HIGH (ref 150–400)
RBC: 5.08 MIL/uL (ref 4.22–5.81)
RDW: 14.7 % (ref 11.5–15.5)
WBC: 15.8 10*3/uL — ABNORMAL HIGH (ref 4.0–10.5)
nRBC: 0 % (ref 0.0–0.2)

## 2020-08-07 LAB — PROTIME-INR
INR: 1.1 (ref 0.8–1.2)
Prothrombin Time: 13.9 seconds (ref 11.4–15.2)

## 2020-08-07 LAB — GLUCOSE, CAPILLARY
Glucose-Capillary: 74 mg/dL (ref 70–99)
Glucose-Capillary: 74 mg/dL (ref 70–99)

## 2020-08-07 MED ORDER — SODIUM CHLORIDE 0.9 % IV SOLN: INTRAVENOUS | Status: DC

## 2020-08-07 MED ORDER — MIDAZOLAM HCL 2 MG/2ML IJ SOLN
INTRAMUSCULAR | Status: AC | PRN
  Administered 2020-08-07: 1 mg via INTRAVENOUS

## 2020-08-07 MED ORDER — FENTANYL CITRATE (PF) 100 MCG/2ML IJ SOLN
INTRAMUSCULAR | Status: AC
  Filled 2020-08-07: qty 2

## 2020-08-07 MED ORDER — FENTANYL CITRATE (PF) 100 MCG/2ML IJ SOLN
INTRAMUSCULAR | Status: AC | PRN
  Administered 2020-08-07: 50 ug via INTRAVENOUS

## 2020-08-07 MED ORDER — SODIUM CHLORIDE 0.9 % IV SOLN
INTRAVENOUS | Status: AC | PRN
  Administered 2020-08-07: 10 mL/h via INTRAVENOUS

## 2020-08-07 MED ORDER — MIDAZOLAM HCL 2 MG/2ML IJ SOLN
INTRAMUSCULAR | Status: AC
  Filled 2020-08-07: qty 2

## 2020-08-07 NOTE — Discharge Instructions (Signed)

## 2020-08-07 NOTE — H&P (Signed)
Referring Physician(s): Ladell Pier  Supervising Physician: Suttle,D  Patient Status:  [facility]  Chief Complaint:  "I'm having a liver biopsy"  Subjective: Patient familiar to IR service from random liver biopsy in 2010. He has a history of hepatitis C, cirrhosis and elevated liver function tests/mildly elevated AFP. Recent imaging has revealed : 1. 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 2. LEFT chest wall mass with soft tissue expansion of the seventh rib. 3. Scattered subcentimeter pulmonary nodules. Two of the nodules were imaged and stable on comparison CT abdomen from 2014. Favor benign nodules. Recommend attention on follow-up. 4. Centrilobular emphysema in the the upper lobes.    1. Large infiltrative mass in the LEFT hepatic lobe most consistent with hepatocellular carcinoma. 2. Bulky periportal and periaortic metastatic adenopathy. 3. Cirrhotic liver.  He presents today for image guided liver lesion biopsy for further evaluation. He currently denies fever, headache, chest pain, worsening dyspnea, cough, abdominal/back pain, nausea, vomiting or bleeding. Additional history as below.  Past Medical History:  Diagnosis Date  . Carotid artery occlusion   . Chronic hepatitis C ([place])   . Diabetes mellitus    Past Surgical History:  Procedure Laterality Date  . Aortic arch angiogram  04-15-12   By Dr. Einar Gip  . ARCH AORTOGRAM  04/15/2012   Procedure: ARCH AORTOGRAM;  Surgeon: Laverda Page, MD;  Location: [facility] CATH LAB;  Service: Cardiovascular;;  . CAROTID ANGIOGRAM N/A 04/15/2012   Procedure: CAROTID ANGIOGRAM;  Surgeon: Laverda Page, MD;  Location: [facility] CATH LAB;  Service: Cardiovascular;  Laterality: N/A;  . CAROTID STENT INSERTION N/A 08/23/2012   Procedure: CAROTID STENT INSERTION;  Surgeon: Elam Dutch, MD;  Location: [facility] CATH LAB;  Service: Cardiovascular;  Laterality: N/A;     Allergies: Patient has no known  allergies.  Medications: Prior to Admission medications   Medication Sig Start Date End Date Taking? Authorizing Provider  amLODipine (NORVASC) 10 MG tablet Take 1 tablet (10 mg total) by mouth daily. Patient taking differently: Take 10 mg by mouth at bedtime.  07/04/20  Yes Charlott Rakes, MD  aspirin EC 81 MG tablet Take 1 tablet (81 mg total) by mouth daily. Patient taking differently: Take 81 mg by mouth at bedtime.  03/12/20  Yes Elsie Stain, MD  atorvastatin (LIPITOR) 40 MG tablet Take 1 tablet (40 mg total) by mouth daily. Patient taking differently: Take 40 mg by mouth at bedtime.  07/04/20  Yes Newlin, Enobong, MD  insulin glargine (LANTUS SOLOSTAR) 100 UNIT/ML Solostar Pen INJECT 65 UNITS INTO THE SKIN DAILY. Patient taking differently: Inject 65 Units into the skin at bedtime.  07/04/20  Yes Charlott Rakes, MD  lisinopril-hydrochlorothiazide (ZESTORETIC) 20-12.5 MG tablet Take 1 tablet by mouth daily. Patient taking differently: Take 1 tablet by mouth at bedtime.  07/04/20  Yes Newlin, Charlane Ferretti, MD  metFORMIN (GLUCOPHAGE) 500 MG tablet TAKE 2 TABLETS BY MOUTH 2 TIMES DAILY WITH A MEAL Patient taking differently: Take 1,000 mg by mouth at bedtime.  07/04/20  Yes Charlott Rakes, MD  Multiple Vitamin (MULTIVITAMIN WITH MINERALS) TABS Take 1 tablet by mouth at bedtime. Centrum Silver   Yes [provider]  Blood Glucose Monitoring Suppl (TRUE METRIX METER) w/Device KIT Use to check blood sugar as directed 12/09/15   Charlott Rakes, MD  gabapentin (NEURONTIN) 300 MG capsule Take 1 capsule (300 mg total) by mouth 3 (three) times daily. Patient not taking: Reported on 07/29/2020 06/07/20   Raylene Everts, MD  glucose blood test strip Test 3 times daily. 03/12/20   Elsie Stain, MD  Insulin Pen Needle (TRUEPLUS PEN NEEDLES) 31G X 8 MM MISC USE AS DIRECTED 4 TIMES DAILY AFTER MEALS AND AT BEDTIME 03/12/20   Elsie Stain, MD  TRUEplus Lancets 28G MISC Use as directed 03/12/20    Elsie Stain, MD     Vital Signs: BP 109/64   Pulse 98   Temp 98.1 F (36.7 C) (Oral)   Resp 16   Ht _0  (1.803 m)   Wt 185 lb (83.9 kg)   SpO2 93%   BMI 25.80 kg/m   Physical Exam awake, alert. Chest with distant breath sounds bilaterally. Heart with regular rate and rhythm. Abdomen soft, positive bowel sounds, nontender. No lower extremity edema.  Imaging: CT CHEST W CONTRAST  Result Date: 08/05/2020 CLINICAL DATA:  Infiltrative liver mass. Hepatitis C. Metastatic adenopathy and chest wall mass. Mildly elevated alpha fetoprotein. EXAM: CT CHEST, ABDOMEN, AND PELVIS WITH CONTRAST TECHNIQUE: Multidetector CT imaging of the chest, abdomen and pelvis was performed following the standard protocol during bolus administration of intravenous contrast. CONTRAST:  139m OMNIPAQUE IOHEXOL 300 MG/ML  SOLN COMPARISON:  MRI 07/17/2020 FINDINGS: CT CHEST FINDINGS Cardiovascular: Coronary artery calcification and aortic atherosclerotic calcification. Mediastinum/Nodes: Enlarged retrocrural lymph nodes along the esophagus below the carina. For example 16 mm node on image 63/7 and 11 mm node on image 71/7. Lungs/Pleura: Several noncalcified RIGHT lung nodules noted: 3 mm RIGHT lower lobe nodule (image 83/series 11) 5 mm RIGHT lower lobe nodule (image 71) 3 mm RIGHT upper lobe nodule (image 67) 5 mm RIGHT middle lobe nodule (image 82) 3 mm lingular nodule (image 90) Two of the RIGHT lung of these nodules were imaged on comparison abdominal CT from 03/02/2013 and are unchanged. Musculoskeletal: A chest wall mass involving the posterolateral LEFT seventh rib with bone destruction and soft tissue expansion measures 5.6 by 3.2 cm (image 81/11). CT ABDOMEN PELVIS FINDINGS Hepatobiliary: infiltrative mass occupying large portion of the LEFT hepatic lobe measures 11.6 by 8.1 cm (image 86/7). No known lesion in the RIGHT hepatic lobe is not well-defined on CT imaging. Better depicted on comparison MRI. Liver has  a lobular contour consistent with cirrhosis. Enlarged periportal lymph node again demonstrated measuring 3 cm short axis. Gastrohepatic ligament nodes measuring 2.8 cm again noted. Celiac trunk lymph node measuring 17 mm on image 117. Pancreas: No pancreatic lesion. Spleen: Normal spleen. Adrenals/Urinary Tract: Adrenal glands normal. Kidneys enhance symmetrically. Ureters bladder normal. Stomach/Bowel: Stomach small-bowel appendix and cecum normal. Moderate volume stool in the colon. No obstructing lesion. Vascular/Lymphatic: Abdominal aorta is normal caliber with atherosclerotic calcification. Periportal and upper abdominal retroperitoneal Periaortic adenopathy is described in the hepatic biliary section. No iliac adenopathy. Reproductive: Prostate unremarkable. Other: No peritoneal metastasis. Musculoskeletal: No additional skeletal metastasis identified other than the LEFT chest wall mass. IMPRESSION: Chest Impression: 1. 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 2. LEFT chest wall mass with soft tissue expansion of the seventh rib. 3. Scattered subcentimeter pulmonary nodules. Two of the nodules were imaged and stable on comparison CT abdomen from 2014. Favor benign nodules. Recommend attention on follow-up. 4. Centrilobular emphysema in the the upper lobes. Abdomen / Pelvis Impression: 1. Large infiltrative mass in the LEFT hepatic lobe most consistent with hepatocellular carcinoma. 2. Bulky periportal and periaortic metastatic adenopathy. 3. Cirrhotic liver. Electronically Signed   By: SSuzy BouchardM.D.   On: 08/05/2020 08:50   CT ABDOMEN PELVIS W  CONTRAST  Result Date: 08/05/2020 CLINICAL DATA:  Infiltrative liver mass. Hepatitis C. Metastatic adenopathy and chest wall mass. Mildly elevated alpha fetoprotein. EXAM: CT CHEST, ABDOMEN, AND PELVIS WITH CONTRAST TECHNIQUE: Multidetector CT imaging of the chest, abdomen and pelvis was performed following the standard protocol during  bolus administration of intravenous contrast. CONTRAST:  159m OMNIPAQUE IOHEXOL 300 MG/ML  SOLN COMPARISON:  MRI 07/17/2020 FINDINGS: CT CHEST FINDINGS Cardiovascular: Coronary artery calcification and aortic atherosclerotic calcification. Mediastinum/Nodes: Enlarged retrocrural lymph nodes along the esophagus below the carina. For example 16 mm node on image 63/7 and 11 mm node on image 71/7. Lungs/Pleura: Several noncalcified RIGHT lung nodules noted: 3 mm RIGHT lower lobe nodule (image 83/series 11) 5 mm RIGHT lower lobe nodule (image 71) 3 mm RIGHT upper lobe nodule (image 67) 5 mm RIGHT middle lobe nodule (image 82) 3 mm lingular nodule (image 90) Two of the RIGHT lung of these nodules were imaged on comparison abdominal CT from 03/02/2013 and are unchanged. Musculoskeletal: A chest wall mass involving the posterolateral LEFT seventh rib with bone destruction and soft tissue expansion measures 5.6 by 3.2 cm (image 81/11). CT ABDOMEN PELVIS FINDINGS Hepatobiliary: infiltrative mass occupying large portion of the LEFT hepatic lobe measures 11.6 by 8.1 cm (image 86/7). No known lesion in the RIGHT hepatic lobe is not well-defined on CT imaging. Better depicted on comparison MRI. Liver has a lobular contour consistent with cirrhosis. Enlarged periportal lymph node again demonstrated measuring 3 cm short axis. Gastrohepatic ligament nodes measuring 2.8 cm again noted. Celiac trunk lymph node measuring 17 mm on image 117. Pancreas: No pancreatic lesion. Spleen: Normal spleen. Adrenals/Urinary Tract: Adrenal glands normal. Kidneys enhance symmetrically. Ureters bladder normal. Stomach/Bowel: Stomach small-bowel appendix and cecum normal. Moderate volume stool in the colon. No obstructing lesion. Vascular/Lymphatic: Abdominal aorta is normal caliber with atherosclerotic calcification. Periportal and upper abdominal retroperitoneal Periaortic adenopathy is described in the hepatic biliary section. No iliac adenopathy.  Reproductive: Prostate unremarkable. Other: No peritoneal metastasis. Musculoskeletal: No additional skeletal metastasis identified other than the LEFT chest wall mass. IMPRESSION: Chest Impression: 1. 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 2. LEFT chest wall mass with soft tissue expansion of the seventh rib. 3. Scattered subcentimeter pulmonary nodules. Two of the nodules were imaged and stable on comparison CT abdomen from 2014. Favor benign nodules. Recommend attention on follow-up. 4. Centrilobular emphysema in the the upper lobes. Abdomen / Pelvis Impression: 1. Large infiltrative mass in the LEFT hepatic lobe most consistent with hepatocellular carcinoma. 2. Bulky periportal and periaortic metastatic adenopathy. 3. Cirrhotic liver. Electronically Signed   By: SSuzy BouchardM.D.   On: 08/05/2020 08:50    Labs:  CBC: Recent Labs    07/29/20 1517 08/07/20 0615  WBC 15.0* 15.8*  HGB 13.4 13.1  HCT 41.4 42.1  PLT 427* 497*    COAGS: Recent Labs    07/29/20 1517 08/07/20 0615  INR 1.2 1.1    BMP: Recent Labs    06/21/20 0925 07/29/20 1517  NA 136 134*  K 4.7 4.3  CL 101 100  CO2 23 32  GLUCOSE 149* 123*  BUN 10 8  CALCIUM 8.9 9.2  CREATININE 0.81 0.89  GFRNONAA 91 >60  GFRAA 106 >60    LIVER FUNCTION TESTS: Recent Labs    06/21/20 0925 07/29/20 1517  BILITOT 0.8 0.8  AST 41* 66*  ALT 71* 120*  ALKPHOS 325* 465*  PROT 8.4 9.9*  ALBUMIN 3.6* 2.3*    Assessment and Plan:  69 yo male with history of hepatitis C, cirrhosis and elevated liver function tests/mildly elevated AFP. Recent imaging has revealed : 1. 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 2. LEFT chest wall mass with soft tissue expansion of the seventh rib. 3. Scattered subcentimeter pulmonary nodules. Two of the nodules were imaged and stable on comparison CT abdomen from 2014. Favor benign nodules. Recommend attention on follow-up. 4. Centrilobular  emphysema in the the upper lobes.    1. Large infiltrative mass in the LEFT hepatic lobe most consistent with hepatocellular carcinoma. 2. Bulky periportal and periaortic metastatic adenopathy. 3. Cirrhotic liver.  He presents today for image guided liver lesion biopsy for further evaluation.Risks and benefits of procedure was discussed with the patient including, but not limited to bleeding, infection, damage to adjacent structures or low yield requiring additional tests.  All of the questions were answered and there is agreement to proceed.  Consent signed and in chart.     Electronically Signed: D. Rowe Robert, PA-C 08/07/2020, 7:40 AM   I spent a total of 25 minutes at the the patient's bedside AND on the patient's hospital floor or unit, greater than 50% of which was counseling/coordinating care for image guided liver lesion biopsy

## 2020-08-07 NOTE — Discharge Instructions (Addendum)
Needle Biopsy, Care After These instructions tell you how to care for yourself after your procedure. Your doctor may also give you more specific instructions. Call your doctor if you have any problems or questions. What can I expect after the procedure? After the procedure, it is common to have:  Soreness.  Bruising.  Mild pain. Follow these instructions at home:   Return to your normal activities as told by your doctor. Ask your doctor what activities are safe for you.  Take over-the-counter and prescription medicines only as told by your doctor.  Wash your hands with soap and water before you change your bandage (dressing). If you cannot use soap and water, use hand sanitizer.  Follow instructions from your doctor about: ? How to take care of your puncture site. ? When and how to change your bandage. ? When to remove your bandage.  Check your puncture site every day for signs of infection. Watch for: ? Redness, swelling, or pain. ? Fluid or blood. ? Pus or a bad smell. ? Warmth.  Do not take baths, swim, or use a hot tub until your doctor approves. Ask your doctor if you may take showers. You may only be allowed to take sponge baths.  Keep all follow-up visits as told by your doctor. This is important. Contact a doctor if you have:  A fever.  Redness, swelling, or pain at the puncture site, and it lasts longer than a few days.  Fluid, blood, or pus coming from the puncture site.  Warmth coming from the puncture site. Get help right away if:  You have a lot of bleeding from the puncture site. Summary  After the procedure, it is common to have soreness, bruising, or mild pain at the puncture site.  Check your puncture site every day for signs of infection, such as redness, swelling, or pain.  Get help right away if you have severe bleeding from your puncture site. This information is not intended to replace advice given to you by your health care provider. Make  sure you discuss any questions you have with your health care provider. Document Revised: 11/01/2017 Document Reviewed: 11/01/2017 Elsevier Patient Education  2020 [employer] Moderate Conscious Sedation, Adult Sedation is the use of medicines to promote relaxation and relieve discomfort and anxiety. Moderate conscious sedation is a type of sedation. Under moderate conscious sedation, you are less alert than normal, but you are still able to respond to instructions, touch, or both. Moderate conscious sedation is used during short medical and dental procedures. It is milder than deep sedation, which is a type of sedation under which you cannot be easily woken up. It is also milder than general anesthesia, which is the use of medicines to make you unconscious. Moderate conscious sedation allows you to return to your regular activities sooner. Tell a health care provider about:  Any allergies you have.  All medicines you are taking, including vitamins, herbs, eye drops, creams, and over-the-counter medicines.  Use of steroids (by mouth or creams).  Any problems you or family members have had with sedatives and anesthetic medicines.  Any blood disorders you have.  Any surgeries you have had.  Any medical conditions you have, such as sleep apnea.  Whether you are pregnant or may be pregnant.  Any use of cigarettes, alcohol, marijuana, or street drugs. What are the risks? Generally, this is a safe procedure. However, problems may occur, including:  Getting too much medicine (oversedation).  Nausea.  Allergic reaction to   medicines.  Trouble breathing. If this happens, a breathing tube may be used to help with breathing. It will be removed when you are awake and breathing on your own.  Heart trouble.  Lung trouble. What happens before the procedure? Staying hydrated Follow instructions from your health care provider about hydration, which may include:  Up to 2 hours before the  procedure - you may continue to drink clear liquids, such as water, clear fruit juice, black coffee, and plain tea. Eating and drinking restrictions Follow instructions from your health care provider about eating and drinking, which may include:  8 hours before the procedure - stop eating heavy meals or foods such as meat, fried foods, or fatty foods.  6 hours before the procedure - stop eating light meals or foods, such as toast or cereal.  6 hours before the procedure - stop drinking milk or drinks that contain milk.  2 hours before the procedure - stop drinking clear liquids. Medicine Ask your health care provider about:  Changing or stopping your regular medicines. This is especially important if you are taking diabetes medicines or blood thinners.  Taking medicines such as aspirin and ibuprofen. These medicines can thin your blood. Do not take these medicines before your procedure if your health care provider instructs you not to.  Tests and exams  You will have a physical exam.  You may have blood tests done to show: ? How well your kidneys and liver are working. ? How well your blood can clot. General instructions  Plan to have someone take you home from the hospital or clinic.  If you will be going home right after the procedure, plan to have someone with you for 24 hours. What happens during the procedure?  An IV tube will be inserted into one of your veins.  Medicine to help you relax (sedative) will be given through the IV tube.  The medical or dental procedure will be performed. What happens after the procedure?  Your blood pressure, heart rate, breathing rate, and blood oxygen level will be monitored often until the medicines you were given have worn off.  Do not drive for 24 hours. This information is not intended to replace advice given to you by your health care provider. Make sure you discuss any questions you have with your health care provider. Document  Revised: 10/01/2017 Document Reviewed: 02/08/2016 Elsevier Patient Education  2020 [employer] Inc.  

## 2020-08-08 ENCOUNTER — Ambulatory Visit: Payer: Medicare Other | Admitting: Nurse Practitioner

## 2020-08-10 ENCOUNTER — Emergency Department (HOSPITAL_COMMUNITY): Payer: Medicare Other

## 2020-08-10 ENCOUNTER — Other Ambulatory Visit: Payer: Self-pay

## 2020-08-10 ENCOUNTER — Encounter (HOSPITAL_COMMUNITY): Payer: Self-pay | Admitting: Radiology

## 2020-08-10 ENCOUNTER — Emergency Department (HOSPITAL_COMMUNITY)
Admission: EM | Admit: 2020-08-10 | Discharge: 2020-08-10 | Disposition: A | Discharge status: Home / Self Care | Payer: Medicare Other | Attending: Emergency Medicine | Admitting: Emergency Medicine

## 2020-08-10 DIAGNOSIS — F17210 Nicotine dependence, cigarettes, uncomplicated: Secondary | ICD-10-CM | POA: Insufficient documentation

## 2020-08-10 DIAGNOSIS — Z7984 Long term (current) use of oral hypoglycemic drugs: Secondary | ICD-10-CM | POA: Insufficient documentation

## 2020-08-10 DIAGNOSIS — I739 Peripheral vascular disease, unspecified: Secondary | ICD-10-CM

## 2020-08-10 DIAGNOSIS — M25552 Pain in left hip: Secondary | ICD-10-CM | POA: Diagnosis not present

## 2020-08-10 DIAGNOSIS — Z794 Long term (current) use of insulin: Secondary | ICD-10-CM | POA: Diagnosis not present

## 2020-08-10 DIAGNOSIS — I70203 Unspecified atherosclerosis of native arteries of extremities, bilateral legs: Secondary | ICD-10-CM | POA: Diagnosis not present

## 2020-08-10 DIAGNOSIS — Z7982 Long term (current) use of aspirin: Secondary | ICD-10-CM | POA: Diagnosis not present

## 2020-08-10 DIAGNOSIS — R1032 Left lower quadrant pain: Secondary | ICD-10-CM | POA: Diagnosis not present

## 2020-08-10 DIAGNOSIS — Z79899 Other long term (current) drug therapy: Secondary | ICD-10-CM | POA: Diagnosis not present

## 2020-08-10 DIAGNOSIS — E119 Type 2 diabetes mellitus without complications: Secondary | ICD-10-CM | POA: Diagnosis not present

## 2020-08-10 DIAGNOSIS — I10 Essential (primary) hypertension: Secondary | ICD-10-CM | POA: Diagnosis not present

## 2020-08-10 DIAGNOSIS — R102 Pelvic and perineal pain: Secondary | ICD-10-CM | POA: Diagnosis not present

## 2020-08-10 DIAGNOSIS — I708 Atherosclerosis of other arteries: Secondary | ICD-10-CM | POA: Diagnosis not present

## 2020-08-10 LAB — CBC WITH DIFFERENTIAL/PLATELET
Abs Immature Granulocytes: 0.05 10*3/uL (ref 0.00–0.07)
Basophils Absolute: 0.1 10*3/uL (ref 0.0–0.1)
Basophils Relative: 1 %
Eosinophils Absolute: 1.9 10*3/uL — ABNORMAL HIGH (ref 0.0–0.5)
Eosinophils Relative: 15 %
HCT: 36.2 % — ABNORMAL LOW (ref 39.0–52.0)
Hemoglobin: 11.4 g/dL — ABNORMAL LOW (ref 13.0–17.0)
Immature Granulocytes: 0 %
Lymphocytes Relative: 19 %
Lymphs Abs: 2.4 10*3/uL (ref 0.7–4.0)
MCH: 25.7 pg — ABNORMAL LOW (ref 26.0–34.0)
MCHC: 31.5 g/dL (ref 30.0–36.0)
MCV: 81.7 fL (ref 80.0–100.0)
Monocytes Absolute: 1.1 10*3/uL — ABNORMAL HIGH (ref 0.1–1.0)
Monocytes Relative: 9 %
Neutro Abs: 7 10*3/uL (ref 1.7–7.7)
Neutrophils Relative %: 56 %
Platelets: 360 10*3/uL (ref 150–400)
RBC: 4.43 MIL/uL (ref 4.22–5.81)
RDW: 14.6 % (ref 11.5–15.5)
WBC: 12.6 10*3/uL — ABNORMAL HIGH (ref 4.0–10.5)
nRBC: 0 % (ref 0.0–0.2)

## 2020-08-10 LAB — BASIC METABOLIC PANEL
Anion gap: 8 (ref 5–15)
BUN: 10 mg/dL (ref 8–23)
CO2: 25 mmol/L (ref 22–32)
Calcium: 8.5 mg/dL — ABNORMAL LOW (ref 8.9–10.3)
Chloride: 101 mmol/L (ref 98–111)
Creatinine, Ser: 0.81 mg/dL (ref 0.61–1.24)
GFR, Estimated: >60 mL/min (ref >60)
Glucose, Bld: 85 mg/dL (ref 70–99)
Potassium: 4.5 mmol/L (ref 3.5–5.1)
Sodium: 134 mmol/L — ABNORMAL LOW (ref 135–145)

## 2020-08-10 LAB — I-STAT CHEM 8, ED
BUN: 10 mg/dL (ref 8–23)
Calcium, Ion: 1.15 mmol/L (ref 1.15–1.40)
Chloride: 99 mmol/L (ref 98–111)
Creatinine, Ser: 0.6 mg/dL — ABNORMAL LOW (ref 0.61–1.24)
Glucose, Bld: 94 mg/dL (ref 70–99)
HCT: 36 % — ABNORMAL LOW (ref 39.0–52.0)
Hemoglobin: 12.2 g/dL — ABNORMAL LOW (ref 13.0–17.0)
Potassium: 4.2 mmol/L (ref 3.5–5.1)
Sodium: 136 mmol/L (ref 135–145)
TCO2: 26 mmol/L (ref 22–32)

## 2020-08-10 LAB — URINALYSIS, ROUTINE W REFLEX MICROSCOPIC
Bilirubin Urine: NEGATIVE
Glucose, UA: NEGATIVE mg/dL
Hgb urine dipstick: NEGATIVE
Ketones, ur: NEGATIVE mg/dL
Leukocytes,Ua: NEGATIVE
Nitrite: NEGATIVE
Protein, ur: NEGATIVE mg/dL
Specific Gravity, Urine: 1.011 (ref 1.005–1.030)
pH: 6 (ref 5.0–8.0)

## 2020-08-10 MED ORDER — IOHEXOL 350 MG/ML SOLN
100.0000 mL | Freq: Once | INTRAVENOUS | Status: AC | PRN
  Administered 2020-08-10: 100 mL via INTRAVENOUS

## 2020-08-10 MED ORDER — HYDROCODONE-ACETAMINOPHEN 5-325 MG PO TABS
1.0000 | ORAL_TABLET | Freq: Once | ORAL | Status: AC
  Administered 2020-08-10: 1 via ORAL
  Filled 2020-08-10: qty 1

## 2020-08-10 MED ORDER — HYDROCODONE-ACETAMINOPHEN 5-325 MG PO TABS: 1.0000 | ORAL_TABLET | ORAL | 0 refills | Status: DC | PRN

## 2020-08-10 MED ORDER — OXYCODONE HCL 5 MG PO TABS
5.0000 mg | ORAL_TABLET | ORAL | 0 refills | Status: DC | PRN
Start: 2020-08-10 — End: 2020-08-12

## 2020-08-10 NOTE — ED Notes (Signed)
Pt transferred to x ray

## 2020-08-10 NOTE — ED Notes (Signed)
Call sister [name] when ready to discharge

## 2020-08-10 NOTE — ED Notes (Signed)
Patient verbalizes understanding of discharge instructions. Opportunity for questioning and answers were provided. Pt discharged from ED. 

## 2020-08-10 NOTE — ED Provider Notes (Signed)
[facility]ERGENC[facility] DEPARTMENT Provider Note   CSN: [id number] Arrival date & time: 08/10/20  5009     History Chief Complaint  Patient presents with  . Groin Pain    Jesse Wilcox is a 69 y.o. male.  Patient is a 69 year old male with a history of diabetes, cirrhosis, hepatitis C and past alcohol use who presents with left groin pain.  He says been hurting for 2 days.  He says it hurts to walk on it and put weight on it.  It is in his left groin.  He says at times it radiates down into his testicle area.  No swelling to the area.  No associated abdominal pain.  No fevers.  No urinary symptoms.  No associated back pain.  He denies any recent trauma to the area.  No recent falls.  He says it hurts to move it and put weight on it.  He recently was diagnosed with a large mass in his liver and an associated mass in his chest.  He had a biopsy on October 6.  It appears to be suspicious for hepatocellular carcinoma but biopsy results are pending.  He has seen oncology.        Past Medical History:  Diagnosis Date  . Carotid artery occlusion   . Chronic hepatitis C ([place])   . Diabetes mellitus     Patient Active Problem List   Diagnosis Date Noted  . Cirrhosis of liver without ascites ([place]) 07/04/2020  . Pain due to onychomycosis of toenails of both feet 06/16/2019  . Chronic hepatitis C ([place]) 10/29/2015  . Hyperlipidemia 10/29/2015  . Tobacco abuse 10/29/2015  . Spondylolisthesis at L4-L5 level 11/14/2014  . Essential hypertension, benign 07/17/2014  . Occlusion and stenosis of carotid artery without mention of cerebral infarction 04/15/2012  . DM type 2 (diabetes mellitus, type 2) ([place]) 02/10/2012    Past Surgical History:  Procedure Laterality Date  . Aortic arch angiogram  04-15-12   By Dr. Einar Gip  . ARCH AORTOGRAM  04/15/2012   Procedure: ARCH AORTOGRAM;  Surgeon: Laverda Page, MD;  Location: [facility] CATH LAB;  Service: Cardiovascular;;  . CAROTID  ANGIOGRAM N/A 04/15/2012   Procedure: CAROTID ANGIOGRAM;  Surgeon: Laverda Page, MD;  Location: [facility] CATH LAB;  Service: Cardiovascular;  Laterality: N/A;  . CAROTID STENT INSERTION N/A 08/23/2012   Procedure: CAROTID STENT INSERTION;  Surgeon: Elam Dutch, MD;  Location: [facility] CATH LAB;  Service: Cardiovascular;  Laterality: N/A;       Family History  Problem Relation Age of Onset  . Diabetes Mother   . Heart disease Mother   . Hypertension Mother   . Heart attack Mother   . Diabetes Father   . Heart disease Father   . Hypertension Father   . Diabetes Sister   . Hyperlipidemia Sister   . Heart disease Sister   . Heart attack Sister   . Peripheral vascular disease Sister   . Hyperlipidemia Brother   . Hypertension Brother     Social History   Tobacco Use  . Smoking status: Current Every Day Smoker    Packs/day: 1.00    Years: 47.00    Pack years: 47.00    Types: Cigarettes  . Smokeless tobacco: Never Used  Vaping Use  . Vaping Use: Never used  Substance Use Topics  . 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 Drug  use: No    Home Medications Prior to Admission medications   Medication Sig Start Date End Date Taking? Authorizing Provider  amLODipine (NORVASC) 10 MG tablet Take 1 tablet (10 mg total) by mouth daily. Patient taking differently: Take 10 mg by mouth at bedtime.  07/04/20   Charlott Rakes, MD  aspirin EC 81 MG tablet Take 1 tablet (81 mg total) by mouth daily. Patient taking differently: Take 81 mg by mouth at bedtime.  03/12/20   Elsie Stain, MD  atorvastatin (LIPITOR) 40 MG tablet Take 1 tablet (40 mg total) by mouth daily. Patient taking differently: Take 40 mg by mouth at bedtime.  07/04/20   Charlott Rakes, MD  Blood Glucose Monitoring Suppl (TRUE METRIX METER) w/Device KIT Use to check blood sugar as directed 12/09/15   Charlott Rakes, MD  gabapentin (NEURONTIN) 300 MG capsule Take 1 capsule (300 mg  total) by mouth 3 (three) times daily. Patient not taking: Reported on 07/29/2020 06/07/20   Raylene Everts, MD  glucose blood test strip Test 3 times daily. 03/12/20   Elsie Stain, MD  insulin glargine (LANTUS SOLOSTAR) 100 UNIT/ML Solostar Pen INJECT 65 UNITS INTO THE SKIN DAILY. Patient taking differently: Inject 65 Units into the skin at bedtime.  07/04/20   Charlott Rakes, MD  Insulin Pen Needle (TRUEPLUS PEN NEEDLES) 31G X 8 MM MISC USE AS DIRECTED 4 TIMES DAILY AFTER MEALS AND AT BEDTIME 03/12/20   Elsie Stain, MD  lisinopril-hydrochlorothiazide (ZESTORETIC) 20-12.5 MG tablet Take 1 tablet by mouth daily. Patient taking differently: Take 1 tablet by mouth at bedtime.  07/04/20   Charlott Rakes, MD  metFORMIN (GLUCOPHAGE) 500 MG tablet TAKE 2 TABLETS BY MOUTH 2 TIMES DAILY WITH A MEAL Patient taking differently: Take 1,000 mg by mouth at bedtime.  07/04/20   Charlott Rakes, MD  Multiple Vitamin (MULTIVITAMIN WITH MINERALS) TABS Take 1 tablet by mouth at bedtime. Centrum Silver    [provider]  TRUEplus Lancets 28G MISC Use as directed 03/12/20   Elsie Stain, MD    Allergies    Patient has no known allergies.  Review of Systems   Review of Systems  Constitutional: Negative for chills, diaphoresis, fatigue and fever.  HENT: Negative for congestion, rhinorrhea and sneezing.   Eyes: Negative.   Respiratory: Negative for cough, chest tightness and shortness of breath.   Cardiovascular: Negative for chest pain and leg swelling.  Gastrointestinal: Negative for abdominal pain, blood in stool, diarrhea, nausea and vomiting.  Genitourinary: Negative for difficulty urinating, flank pain, frequency and hematuria.  Musculoskeletal: Negative for arthralgias and back pain.  Skin: Negative for rash.  Neurological: Negative for dizziness, speech difficulty, weakness, numbness and headaches.    Physical Exam Updated Vital Signs BP (!) 130/55   Pulse 80   Temp 97.7 F  (36.5 C) (Oral)   Resp 16   Ht _0  (1.803 m)   Wt 83.9 kg   SpO2 96%   BMI 25.80 kg/m   Physical Exam Constitutional:      Appearance: He is well-developed.  HENT:     Head: Normocephalic and atraumatic.  Eyes:     Pupils: Pupils are equal, round, and reactive to light.  Cardiovascular:     Rate and Rhythm: Normal rate and regular rhythm.     Heart sounds: Normal heart sounds.  Pulmonary:     Effort: Pulmonary effort is normal. No respiratory distress.     Breath sounds: Normal breath sounds.  No wheezing or rales.  Chest:     Chest wall: No tenderness.  Abdominal:     General: Bowel sounds are normal.     Palpations: Abdomen is soft.     Tenderness: There is no abdominal tenderness. There is no guarding or rebound.  Genitourinary:    Comments: No swelling to the scrotum.  No tenderness on palpation of the testicles or epididymis.  No hernias are palpated in the inguinal canals. Musculoskeletal:        General: Normal range of motion.     Cervical back: Normal range of motion and neck supple.     Comments: Positive tenderness on range of motion of the left hip.  He has some palpable tenderness in the left groin which seems to be in the pelvic bone.  He has normal sensation and motor function distally.  Lymphadenopathy:     Cervical: No cervical adenopathy.  Skin:    General: Skin is warm and dry.     Findings: No rash.  Neurological:     Mental Status: He is alert and oriented to person, place, and time.     ED Results / Procedures / Treatments   Labs (all labs ordered are listed, but only abnormal results are displayed) Labs Reviewed  CBC WITH DIFFERENTIAL/PLATELET - Abnormal; Notable for the following components:      Result Value   WBC 12.6 (*)    Hemoglobin 11.4 (*)    HCT 36.2 (*)    MCH 25.7 (*)    Monocytes Absolute 1.1 (*)    Eosinophils Absolute 1.9 (*)    All other components within normal limits  I-STAT CHEM 8, ED - Abnormal; Notable for the  following components:   Creatinine, Ser 0.60 (*)    Hemoglobin 12.2 (*)    HCT 36.0 (*)    All other components within normal limits  URINALYSIS, ROUTINE W REFLEX MICROSCOPIC  BASIC METABOLIC PANEL    EKG None  Radiology DG Hip Unilat W or Wo Pelvis 2-3 Views Left  Result Date: 08/10/2020 CLINICAL DATA:  Left hip pain.  No known injury. EXAM: DG HIP (WITH OR WITHOUT PELVIS) 2-3V LEFT COMPARISON:  None. FINDINGS: There is no evidence of hip fracture or dislocation. Mild narrow hip joint spaces are noted. IMPRESSION: No acute fracture or dislocation identified. Mild degenerative joint changes of left hip. Electronically Signed   By: Abelardo Diesel M.D.   On: 08/10/2020 13:18    Procedures Procedures (including critical care time)  Medications Ordered in ED Medications  HYDROcodone-acetaminophen (NORCO/VICODIN) 5-325 MG per tablet 1 tablet (1 tablet Oral Given 08/10/20 1126)  iohexol (OMNIPAQUE) 350 MG/ML injection 100 mL (100 mLs Intravenous Contrast Given 08/10/20 1433)    ED Course  I have reviewed the triage vital signs and the nursing notes.  Pertinent labs & imaging results that were available during my care of the patient were reviewed by me and considered in my medical decision making (see chart for details).    MDM Rules/Calculators/A&P                          Patient is a 69 year old male who presents with left groin pain.  Seems to be coming from right around his hip joint.  It is worse with movement of the left hip.  There is no pain in his testicle or scrotal area.  I do not appreciate a hernia or any noticeable swelling.  He did  have diminished pulses in the feet.  I am able to Doppler both pulses in the right foot.  I was able to Doppler a PT pulse in the left foot but not a DP pulse.  His feet do not have evidence of acute ischemia.  However given his groin pain, I will do a CTA to assess the vascular flow and proximal arteries.  This is pending.  Dr. Gilford Raid to take  over care. Final Clinical Impression(s) / ED Diagnoses Final diagnoses:  None    Rx / DC Orders ED Discharge Orders    None       Malvin Johns, MD 08/10/20 1447

## 2020-08-10 NOTE — ED Triage Notes (Signed)
Pt with bilateral groin pain x 2 days that he reports is keeping him from being able to walk. Denies injury.

## 2020-08-10 NOTE — ED Provider Notes (Signed)
Pt signed out by Dr. Tamera Punt awaiting CTA results.  IMPRESSION:  Vascular Impression:    1. Large amount of irregular atherosclerotic plaque within the  abdominal aorta. Aortic Atherosclerosis (ICD10-I70.0).    Right lower extremity vascular Impression:    1. Suspected tandem areas of at least 50% luminal narrowing  involving the right external iliac artery.  2. Tandem areas of at least 50% luminal narrowing involving the  right superficial femoral artery at the level of the abductor canal.  3. Two vessel runoff to the right lower leg via the right peroneal  and posterior tibial arteries however the predominant arterial  supply to the right foot is via the right posterior tibial artery. A  patent right dorsalis pedis artery is not identified. No discrete  intraluminal filling defects to suggest distal embolism.    Left lower extremity vascular Impression:    1. Suspected 50% luminal narrowing involving the mid/distal aspect  of the left external iliac artery.  2. Large amount of atherosclerotic plaque results in tandem areas of  subtotal occlusion involving the distal aspect left superficial  artery.  3. Two vessel runoff to the left lower leg via the left peroneal and  posterior tibial arteries however the predominant arterial supply to  the left foot is via the left posterior tibial artery. A patent  left-sided dorsalis pedis artery is not identified. No discrete  lumen filling defects to suggest distal embolism.     Results reviewed with Dr. Donzetta Matters (vascular).  He feels like these changes are chronic and not contributing to pt's sx.  Pt's pain is in his hip joint.  No fx on xray.  Pt is to return if worse.  F/u with vascular and with pcp.     Isla Pence, MD 08/10/20 [phone]

## 2020-08-10 NOTE — ED Notes (Signed)
Pt transported to CT ?

## 2020-08-12 ENCOUNTER — Other Ambulatory Visit: Payer: Self-pay

## 2020-08-12 ENCOUNTER — Encounter: Payer: Self-pay | Admitting: Nurse Practitioner

## 2020-08-12 ENCOUNTER — Inpatient Hospital Stay: Payer: Medicare Other | Attending: Oncology | Admitting: Nutrition

## 2020-08-12 ENCOUNTER — Inpatient Hospital Stay (HOSPITAL_BASED_OUTPATIENT_CLINIC_OR_DEPARTMENT_OTHER): Payer: Medicare Other | Admitting: Nurse Practitioner

## 2020-08-12 VITALS — BP 112/55 | HR 98 | Temp 97.6°F | Resp 18 | Ht 71.0 in | Wt 177.0 lb

## 2020-08-12 DIAGNOSIS — I959 Hypotension, unspecified: Secondary | ICD-10-CM | POA: Insufficient documentation

## 2020-08-12 DIAGNOSIS — K7460 Unspecified cirrhosis of liver: Secondary | ICD-10-CM | POA: Diagnosis not present

## 2020-08-12 DIAGNOSIS — R634 Abnormal weight loss: Secondary | ICD-10-CM | POA: Insufficient documentation

## 2020-08-12 DIAGNOSIS — E785 Hyperlipidemia, unspecified: Secondary | ICD-10-CM | POA: Insufficient documentation

## 2020-08-12 DIAGNOSIS — R630 Anorexia: Secondary | ICD-10-CM | POA: Diagnosis not present

## 2020-08-12 DIAGNOSIS — C220 Liver cell carcinoma: Secondary | ICD-10-CM

## 2020-08-12 DIAGNOSIS — R1030 Lower abdominal pain, unspecified: Secondary | ICD-10-CM | POA: Diagnosis not present

## 2020-08-12 DIAGNOSIS — I10 Essential (primary) hypertension: Secondary | ICD-10-CM | POA: Diagnosis not present

## 2020-08-12 LAB — SURGICAL PATHOLOGY

## 2020-08-12 MED ORDER — OXYCODONE HCL 5 MG PO TABS
5.0000 mg | ORAL_TABLET | ORAL | 0 refills | Status: DC | PRN
Start: 2020-08-12 — End: 2020-08-28

## 2020-08-12 NOTE — Progress Notes (Addendum)
[facility] OFF[facility] PROGRESS NOTE   Diagnosis: Liver mass  INTERVAL HISTORY:   Mr. Fettig returns as scheduled.  He underwent biopsy of the left chest wall mass 08/07/2020.  He recently began experiencing pain in the left groin region.  The pain is mainly present with weightbearing.  He was seen in the emergency department over the weekend.  Hip x-ray showed no acute fracture or dislocation.  Mild degenerative joint changes were noted.  CT angio lower extremities performed.  ED Dr. Reviewed results with Dr. Donzetta Matters, changes felt to be chronic and not contributing to current symptoms.  Nonvascular evaluation showed no acute or aggressive osseous abnormalities.  There was an old/healed right midshaft femur fracture, mild degenerative change involving the medial compartments of the bilateral knees as well as suspected osteochondral defect involving the medial aspect of the weightbearing surface of the talus.  He continues to have pain.  No bowel or bladder dysfunction.  Oxycodone has been effective for the pain.  Objective:  Vital signs in last 24 hours:  Blood pressure (!) 112/55, pulse 98, temperature 97.6 F (36.4 C), resp. rate 18, height 5\' 11"  (1.803 m), weight 177 lb (80.3 kg), SpO2 96 %.    Resp: Lungs clear bilaterally. Cardio: Regular rate and rhythm. GI: No hepatomegaly. Vascular: No leg edema. Neuro: Lower extremity motor strength 5/5.  He was able to ambulate in the exam room. Musculoskeletal: Left groin pain with internal/external rotation of the left hip.    Lab Results:  Lab Results  Component Value Date   WBC 12.6 (H) 08/10/2020   HGB 11.4 (L) 08/10/2020   HCT 36.2 (L) 08/10/2020   MCV 81.7 08/10/2020   PLT 360 08/10/2020   NEUTROABS 7.0 08/10/2020    Imaging:  CT ANGIO LOW EXTREM LEFT W &/OR WO CONTRAST  Result Date: 08/10/2020 CLINICAL DATA:  Bilateral groin pain the past 2 days. Evaluate for peripheral arterial disease. EXAM: CT ANGIOGRAPHY OF  ABDOMINAL AORTA WITH ILIOFEMORAL RUNOFF TECHNIQUE: Multidetector CT imaging of the abdomen, pelvis and lower extremities was performed using the standard protocol during bolus administration of intravenous contrast. Multiplanar CT image reconstructions and MIPs were obtained to evaluate the vascular anatomy. CONTRAST:  140mL OMNIPAQUE IOHEXOL 350 MG/ML SOLN COMPARISON:  CT abdomen and pelvis-08/05/2020 FINDINGS: VASCULAR Aorta: There is a large amount of irregular atherosclerotic plaque within the imaged distal most aspect of the abdominal aorta. _________________________________________________________ RIGHT Lower Extremity Inflow: There is a moderate amount of eccentric mixed calcified and noncalcified atherosclerotic plaque involving the normal caliber right common iliac artery, not resulting in a hemodynamically significant stenosis. The right internal iliac artery is diseased though patent and of normal caliber. Eccentric predominantly noncalcified atherosclerotic plaque results in approximately 50% luminal narrowing involving the proximal aspect of the right external iliac artery (coronal image 29, series 7), as well as the mid/distal aspect of the right external iliac artery (coronal image 28, series 7). Outflow: There is a minimal amount of eccentric mixed calcified and noncalcified atherosclerotic plaque within the right common femoral artery, not resulting in hemodynamically significant stenosis. The right deep femoral artery is disease though pain and of normal caliber. There is a moderate amount of scattered irregular atherosclerotic plaque scattered throughout the right superficial femoral artery. Tandem areas of at least 50% luminal narrowing are seen at the level of the right adductor canal (image 141, 146 and 161, series 5). The right above and below-knee popliteal artery is diseased though patent of normal caliber and without  a hemodynamically significant narrowing. Runoff: The anterior tibial  artery occludes shortly after its origin. Two vessel runoff to the right lower leg however the peroneal artery occludes at the level of the ankle mortise. Predominant arterial supply to the foot is via the right posterior tibial artery. A patent right dorsalis pedis artery is not identified. No discrete intraluminal filling defects to suggest distal embolism. _________________________________________________________ LEFT Lower Extremity Inflow: There is a moderate amount of eccentric mixed calcified and noncalcified atherosclerotic plaque involving the normal caliber left common iliac artery, not resulting in a hemodynamically significant stenosis. The left internal iliac artery is disease though patent and of normal caliber. There is a moderate to large amount of eccentric mixed calcified and noncalcified atherosclerotic plaque scattered throughout the left external iliac artery which approaches 50% luminal narrowing at its mid/distal aspect (coronal image 27, series 7). Outflow: There is a moderate amount of eccentric calcified atherosclerotic plaque involving the left common femoral artery, not resulting in hemodynamically significant stenosis. The left deep femoral artery is disease though patent and of normal caliber. Large amount of eccentric predominantly noncalcified atherosclerotic plaque results in tandem areas of subtotal occlusion involving the distal aspect of the left superficial femoral artery extending to the level of the abductor canal (representative images 129, 141 and 156, series 5). Eccentric mixed calcified and noncalcified atherosclerotic plaque is seen throughout left above and below-knee popliteal artery, not resulting in a hemodynamically significant stenosis. Runoff: The left anterior tibial artery occludes at the level of the mid calf. Two vessel runoff to the left lower leg however the left peroneal artery occludes at the level of the ankle mortise. Predominant arterial supply is via the  left posterior tibial artery which is patent to the level of the forefoot. No discrete lumen filling defects to suggest distal embolism. Veins: The IVC and pelvic venous systems appear widely patent. Review of the MIP images confirms the above findings. _________________________________________________________ _________________________________________________________ NON-VASCULAR Evaluation of the abdominal organs is limited to the arterial phase of enhancement. Stomach/Bowel: Enteric contrast is seen within the sigmoid colon. Moderate colonic stool burden without evidence of enteric obstruction. Normal appearance of the terminal ileum and appendix. Lymphatic: No bulky pelvic or inguinal lymphadenopathy. Reproductive: Normal appearance the prostate gland. No free fluid the pelvic cul-de-sac. Other: Regional soft tissues are normal. Musculoskeletal: No acute or aggressive osseous abnormalities. Old/healed right midshaft femur fracture. Mild degenerative change involving the medial compartments of the bilateral knees as well as suspected osteochondral defect involving the medial aspect of the weight-bearing surface of the talus (coronal image 49, series 7). IMPRESSION: Vascular Impression: 1. Large amount of irregular atherosclerotic plaque within the abdominal aorta. Aortic Atherosclerosis (ICD10-I70.0). Right lower extremity vascular Impression: 1. Suspected tandem areas of at least 50% luminal narrowing involving the right external iliac artery. 2. Tandem areas of at least 50% luminal narrowing involving the right superficial femoral artery at the level of the abductor canal. 3. Two vessel runoff to the right lower leg via the right peroneal and posterior tibial arteries however the predominant arterial supply to the right foot is via the right posterior tibial artery. A patent right dorsalis pedis artery is not identified. No discrete intraluminal filling defects to suggest distal embolism. Left lower extremity  vascular Impression: 1. Suspected 50% luminal narrowing involving the mid/distal aspect of the left external iliac artery. 2. Large amount of atherosclerotic plaque results in tandem areas of subtotal occlusion involving the distal aspect left superficial artery. 3. Two vessel runoff to  the left lower leg via the left peroneal and posterior tibial arteries however the predominant arterial supply to the left foot is via the left posterior tibial artery. A patent left-sided dorsalis pedis artery is not identified. No discrete lumen filling defects to suggest distal embolism. Electronically Signed   By: Sandi Mariscal M.D.   On: 08/10/2020 15:11   CT ANGIO LOW EXTREM RIGHT W &/OR WO CONTRAST  Result Date: 08/10/2020 CLINICAL DATA:  Bilateral groin pain the past 2 days. Evaluate for peripheral arterial disease. EXAM: CT ANGIOGRAPHY OF ABDOMINAL AORTA WITH ILIOFEMORAL RUNOFF TECHNIQUE: Multidetector CT imaging of the abdomen, pelvis and lower extremities was performed using the standard protocol during bolus administration of intravenous contrast. Multiplanar CT image reconstructions and MIPs were obtained to evaluate the vascular anatomy. CONTRAST:  193mL OMNIPAQUE IOHEXOL 350 MG/ML SOLN COMPARISON:  CT abdomen and pelvis-08/05/2020 FINDINGS: VASCULAR Aorta: There is a large amount of irregular atherosclerotic plaque within the imaged distal most aspect of the abdominal aorta. _________________________________________________________ RIGHT Lower Extremity Inflow: There is a moderate amount of eccentric mixed calcified and noncalcified atherosclerotic plaque involving the normal caliber right common iliac artery, not resulting in a hemodynamically significant stenosis. The right internal iliac artery is diseased though patent and of normal caliber. Eccentric predominantly noncalcified atherosclerotic plaque results in approximately 50% luminal narrowing involving the proximal aspect of the right external iliac artery  (coronal image 29, series 7), as well as the mid/distal aspect of the right external iliac artery (coronal image 28, series 7). Outflow: There is a minimal amount of eccentric mixed calcified and noncalcified atherosclerotic plaque within the right common femoral artery, not resulting in hemodynamically significant stenosis. The right deep femoral artery is disease though pain and of normal caliber. There is a moderate amount of scattered irregular atherosclerotic plaque scattered throughout the right superficial femoral artery. Tandem areas of at least 50% luminal narrowing are seen at the level of the right adductor canal (image 141, 146 and 161, series 5). The right above and below-knee popliteal artery is diseased though patent of normal caliber and without a hemodynamically significant narrowing. Runoff: The anterior tibial artery occludes shortly after its origin. Two vessel runoff to the right lower leg however the peroneal artery occludes at the level of the ankle mortise. Predominant arterial supply to the foot is via the right posterior tibial artery. A patent right dorsalis pedis artery is not identified. No discrete intraluminal filling defects to suggest distal embolism. _________________________________________________________ LEFT Lower Extremity Inflow: There is a moderate amount of eccentric mixed calcified and noncalcified atherosclerotic plaque involving the normal caliber left common iliac artery, not resulting in a hemodynamically significant stenosis. The left internal iliac artery is disease though patent and of normal caliber. There is a moderate to large amount of eccentric mixed calcified and noncalcified atherosclerotic plaque scattered throughout the left external iliac artery which approaches 50% luminal narrowing at its mid/distal aspect (coronal image 27, series 7). Outflow: There is a moderate amount of eccentric calcified atherosclerotic plaque involving the left common femoral  artery, not resulting in hemodynamically significant stenosis. The left deep femoral artery is disease though patent and of normal caliber. Large amount of eccentric predominantly noncalcified atherosclerotic plaque results in tandem areas of subtotal occlusion involving the distal aspect of the left superficial femoral artery extending to the level of the abductor canal (representative images 129, 141 and 156, series 5). Eccentric mixed calcified and noncalcified atherosclerotic plaque is seen throughout left above and below-knee popliteal artery, not  resulting in a hemodynamically significant stenosis. Runoff: The left anterior tibial artery occludes at the level of the mid calf. Two vessel runoff to the left lower leg however the left peroneal artery occludes at the level of the ankle mortise. Predominant arterial supply is via the left posterior tibial artery which is patent to the level of the forefoot. No discrete lumen filling defects to suggest distal embolism. Veins: The IVC and pelvic venous systems appear widely patent. Review of the MIP images confirms the above findings. _________________________________________________________ _________________________________________________________ NON-VASCULAR Evaluation of the abdominal organs is limited to the arterial phase of enhancement. Stomach/Bowel: Enteric contrast is seen within the sigmoid colon. Moderate colonic stool burden without evidence of enteric obstruction. Normal appearance of the terminal ileum and appendix. Lymphatic: No bulky pelvic or inguinal lymphadenopathy. Reproductive: Normal appearance the prostate gland. No free fluid the pelvic cul-de-sac. Other: Regional soft tissues are normal. Musculoskeletal: No acute or aggressive osseous abnormalities. Old/healed right midshaft femur fracture. Mild degenerative change involving the medial compartments of the bilateral knees as well as suspected osteochondral defect involving the medial aspect  of the weight-bearing surface of the talus (coronal image 49, series 7). IMPRESSION: Vascular Impression: 1. Large amount of irregular atherosclerotic plaque within the abdominal aorta. Aortic Atherosclerosis (ICD10-I70.0). Right lower extremity vascular Impression: 1. Suspected tandem areas of at least 50% luminal narrowing involving the right external iliac artery. 2. Tandem areas of at least 50% luminal narrowing involving the right superficial femoral artery at the level of the abductor canal. 3. Two vessel runoff to the right lower leg via the right peroneal and posterior tibial arteries however the predominant arterial supply to the right foot is via the right posterior tibial artery. A patent right dorsalis pedis artery is not identified. No discrete intraluminal filling defects to suggest distal embolism. Left lower extremity vascular Impression: 1. Suspected 50% luminal narrowing involving the mid/distal aspect of the left external iliac artery. 2. Large amount of atherosclerotic plaque results in tandem areas of subtotal occlusion involving the distal aspect left superficial artery. 3. Two vessel runoff to the left lower leg via the left peroneal and posterior tibial arteries however the predominant arterial supply to the left foot is via the left posterior tibial artery. A patent left-sided dorsalis pedis artery is not identified. No discrete lumen filling defects to suggest distal embolism. Electronically Signed   By: Sandi Mariscal M.D.   On: 08/10/2020 15:11   DG Hip Unilat W or Wo Pelvis 2-3 Views Left  Result Date: 08/10/2020 CLINICAL DATA:  Left hip pain.  No known injury. EXAM: DG HIP (WITH OR WITHOUT PELVIS) 2-3V LEFT COMPARISON:  None. FINDINGS: There is no evidence of hip fracture or dislocation. Mild narrow hip joint spaces are noted. IMPRESSION: No acute fracture or dislocation identified. Mild degenerative joint changes of left hip. Electronically Signed   By: Abelardo Diesel M.D.   On:  08/10/2020 13:18    Medications: I have reviewed the patient's current medications.  Assessment/Plan: 1. Hepatocellular carcinoma ? MRI abdomen 07/17/2020-large infiltrative mass in the left hepatic lobe characterized as Li-Rads M based on lack of a well-defined lesions, meeting LI-RADS 5 criteria, small right hepatic lesions, chest wall and nodal metastatic disease, changes of cirrhosis with portal hypertension and left portal vein tumor thrombus ? CTs 08/05/2020-chest wall mass involving the posterior lateral left seventh rib with bone destruction and soft tissue expansion measuring 5.6 x 3.2 cm; several noncalcified right lung nodules; enlarged retrocrural lymph nodes along the  esophagus below the carina; infiltrative mass occupying a large portion of the left hepatic lobe measuring 11.6 x 8.1 cm; liver has a lobular contour consistent with cirrhosis; enlarged periportal lymph node; gastrohepatic ligament nodes measuring 2.8 cm; celiac trunk lymph node measuring 17 mm ? 08/07/2020 biopsy left chest wall mass-poorly differentiated carcinoma, the immunophenotype indicates a carcinoma but fails to further define a potential origin ? Referred for liver biopsy 08/12/2020 2. Cirrhosis 3. History of hepatitis C-treated 4. Remote history of alcohol use 5. Hypertension 6. Hyperlipidemia 7. Anorexia/weight loss  8. Left groin pain-referred for bone scan 08/12/2020  Disposition: Mr. Bayron appears stable.  Biopsy of the chest wall mass shows a poorly differentiated carcinoma, unable to further define.  Dr. Benay Spice recommends a biopsy of the liver mass.  Mr. Nosbisch agrees.  Etiology of the left groin pain is unclear.  The pain may be referred from the hip.  Review of CT images does not show an obvious bone lesion.  We are referring him for a bone scan.  New prescription for oxycodone sent to his pharmacy.  He will return for follow-up in approximately 2 weeks.  We are available to see him sooner if  needed.  Patient seen with Dr. Benay Spice.  CT images reviewed on the computer.    Ned Card ANP/GNP-BC   08/12/2020  12:28 PM This was a shared visit with Ned Card.  Mr. Repsher was interviewed and examined.  The chest wall biopsy mass revealed a poorly differentiated carcinoma.  The clinical presentation is consistent with metastatic hepatocellular carcinoma, but biopsy of the chest wall mass did not confirm hepatocellular carcinoma.  Mr. Fallin will be referred for biopsy of the liver mass to confirm hepatocellular carcinoma.  The etiology of the left hip/groin pain is unclear.  We reviewed recent CT images and there is no apparent bone lesion or fracture in the left pelvis.  He will be referred for a bone scan.  The plan was discussed with Mr. Attig and his sister.  Julieanne Manson, MD

## 2020-08-12 NOTE — Progress Notes (Signed)
69 year old male diagnosed with liver mass.  He is a patient of Dr. Benay Spice.  Treatment plan has not been determined.  He sees his provider today.  Past medical history includes hepatitis C, alcohol, diabetes, hypertension, and hyperlipidemia.  Medications include Lantus, Glucophage, multivitamin.  Labs include sodium 134 on October 9.  Height: 5 feet 11 inches. Weight: 175.4 pounds on October 11. Usual body weight: 210 pounds. BMI: 24.46.  Patient currently living alone.  He has a sister who is very supportive. He reports one episode of nausea but no vomiting. He denies diarrhea but reports ongoing issues with constipation.  He is not taking anything. Dietary recall reveals patient drinks 6-8 bottles of water every day.  He eats eggs and biscuit for breakfast.  He is not snacking between meals.  He has not tried oral nutrition supplements. Reports fasting blood sugar this morning was 106.  Nutrition diagnosis:  Food and nutrition related knowledge deficit related to new liver mass as evidenced by no prior need for nutrition related information.  Intervention: Patient educated to consume smaller more frequent meals and snacks consisting of higher calorie, high-protein foods to support weight maintenance. Recommended patient consume 2 oral nutrition supplements daily and provided a variety of samples.  Gave patient coupons.  Offered patient complementary case if he tries samples and likes them. Educated patient on strategies for improving constipation and encouraged bowel regimen. Continue 6 bottles of water daily. Maintain no concentrated sweets diet. Provided fact sheets.  Questions were answered.  Contact information given.  Monitoring, evaluation, goals: Patient will tolerate adequate calories and protein to minimize weight loss.  Next visit: To be scheduled as needed.  **Disclaimer: This note was dictated with voice recognition software. Similar sounding words can  inadvertently be transcribed and this note may contain transcription errors which may not have been corrected upon publication of note.**

## 2020-08-13 ENCOUNTER — Encounter (HOSPITAL_COMMUNITY): Payer: Self-pay

## 2020-08-13 NOTE — Progress Notes (Signed)
[facility] 1  Jesse Wilcox Male, 69 y.o., July 23, 1951 MRN:  [id number] Phone:  [phone] [name], MD Coverage:  Medicare/Medicare Part A And B Next Appt With Radiology ([facility] 2) 08/22/2020 at 1:00 PM  RE: Biopsy Received: Today Message Details  Sandi Mariscal, MD  [name] [place] for US guided liver mass biopsy, left liver.    HCC vs cholangio. AFP only slightly elevated. MRI not diagnostic; chest wall Bx inconclusive.   [facility] requesting liver lesion Bx.   Cathren Harsh   Previous Messages  ----- Message -----  From: [name]  Sent: 08/12/2020  2:17 PM EDT  To: Ir Procedure Requests  Subject: RE: Biopsy                    Hello.   I scheduled an [place] BX before , it was changed to CT BX (final)  And now Owens Shark has sent another request for [place] BX (Liver)  Can I use this review?  ----- Message -----  From: Corrie Mckusick, DO  Sent: 07/30/2020  1:15 PM EDT  To: [name]  Subject: RE: Biopsy                    [place] for US guided liver mass biopsy, left liver.    HCC vs cholangio. AFP only slightly elevated. MRI not diagnostic.   [name]  ----- Message -----  From: [name]  Sent: 07/30/2020 12:17 PM EDT  To: [facility] Procedure Requests  Subject: Biopsy                      Procedure Requested: [place] CORE BIOPSY (SOFT TISSUE)    Reason for Procedure: cirrhosis, liver, chest wall masses    Provider Requesting: Ladell Pier  Provider Telephone: [phone]    Other Info: Biopsy chest wall mass , liver mass biopsy if chest mass not amenable to biopsy

## 2020-08-13 NOTE — Progress Notes (Signed)
[facility] 1  Jesse Wilcox. Jesse Wilcox Male, 69 y.o., 10-13-1951 MRN:  [id number] Phone:  [phone] Jesse Wilcox) PCP:  Charlott Rakes, MD Coverage:  Medicare/Medicare Part A And B Next Appt With Radiology ([facility]) 08/22/2020 at 1:00 PM  RE: Biopsy Received: Yesterday Message Details  Aletta Edouard, MD  [name] E Should just be a US biopsy of liver mass.   GY   Previous Messages  ----- Message -----  From: [name]  Sent: 08/12/2020  2:17 PM EDT  To: [facility] Procedure Requests  Subject: RE: Biopsy                    Hello.   I scheduled an [place] BX before , it was changed to CT BX (final)  And now Owens Shark has sent another request for [place] BX (Liver)  Can I use this review?  ----- Message -----  From: Corrie Mckusick, DO  Sent: 07/30/2020  1:15 PM EDT  To: [name]  Subject: RE: Biopsy                    [place] for US guided liver mass biopsy, left liver.    HCC vs cholangio. AFP only slightly elevated. MRI not diagnostic.   [name]  ----- Message -----  From: [name]  Sent: 07/30/2020 12:17 PM EDT  To: [facility] Procedure Requests  Subject: Biopsy                      Procedure Requested: [place] CORE BIOPSY (SOFT TISSUE)    Reason for Procedure: cirrhosis, liver, chest wall masses    Provider Requesting: Ladell Pier  Provider Telephone: [phone]    Other Info: Biopsy chest wall mass , liver mass biopsy if chest mass not amenable to biopsy

## 2020-08-21 ENCOUNTER — Other Ambulatory Visit: Payer: Self-pay | Admitting: Student

## 2020-08-21 ENCOUNTER — Encounter (INDEPENDENT_AMBULATORY_CARE_PROVIDER_SITE_OTHER): Payer: Medicare Other | Admitting: Ophthalmology

## 2020-08-22 ENCOUNTER — Other Ambulatory Visit: Payer: Self-pay

## 2020-08-22 ENCOUNTER — Encounter: Payer: Self-pay | Admitting: Nutrition

## 2020-08-22 ENCOUNTER — Encounter (HOSPITAL_COMMUNITY): Payer: Self-pay

## 2020-08-22 ENCOUNTER — Ambulatory Visit (HOSPITAL_COMMUNITY)
Admission: RE | Admit: 2020-08-22 | Discharge: 2020-08-22 | Disposition: A | Discharge status: Home / Self Care | Payer: Medicare Other | Source: Ambulatory Visit | Attending: Nurse Practitioner | Admitting: Nurse Practitioner

## 2020-08-22 DIAGNOSIS — I10 Essential (primary) hypertension: Secondary | ICD-10-CM | POA: Insufficient documentation

## 2020-08-22 DIAGNOSIS — I6529 Occlusion and stenosis of unspecified carotid artery: Secondary | ICD-10-CM | POA: Diagnosis not present

## 2020-08-22 DIAGNOSIS — Z95828 Presence of other vascular implants and grafts: Secondary | ICD-10-CM | POA: Diagnosis not present

## 2020-08-22 DIAGNOSIS — E119 Type 2 diabetes mellitus without complications: Secondary | ICD-10-CM | POA: Diagnosis not present

## 2020-08-22 DIAGNOSIS — E785 Hyperlipidemia, unspecified: Secondary | ICD-10-CM | POA: Insufficient documentation

## 2020-08-22 DIAGNOSIS — C229 Malignant neoplasm of liver, not specified as primary or secondary: Secondary | ICD-10-CM | POA: Diagnosis not present

## 2020-08-22 DIAGNOSIS — K7689 Other specified diseases of liver: Secondary | ICD-10-CM | POA: Diagnosis not present

## 2020-08-22 DIAGNOSIS — R160 Hepatomegaly, not elsewhere classified: Secondary | ICD-10-CM | POA: Diagnosis present

## 2020-08-22 DIAGNOSIS — K7460 Unspecified cirrhosis of liver: Secondary | ICD-10-CM | POA: Diagnosis not present

## 2020-08-22 DIAGNOSIS — B182 Chronic viral hepatitis C: Secondary | ICD-10-CM | POA: Insufficient documentation

## 2020-08-22 DIAGNOSIS — C220 Liver cell carcinoma: Secondary | ICD-10-CM | POA: Diagnosis not present

## 2020-08-22 DIAGNOSIS — K759 Inflammatory liver disease, unspecified: Secondary | ICD-10-CM | POA: Diagnosis not present

## 2020-08-22 LAB — CBC
HCT: 39.2 % (ref 39.0–52.0)
Hemoglobin: 12.7 g/dL — ABNORMAL LOW (ref 13.0–17.0)
MCH: 26.5 pg (ref 26.0–34.0)
MCHC: 32.4 g/dL (ref 30.0–36.0)
MCV: 81.8 fL (ref 80.0–100.0)
Platelets: 484 10*3/uL — ABNORMAL HIGH (ref 150–400)
RBC: 4.79 MIL/uL (ref 4.22–5.81)
RDW: 15.4 % (ref 11.5–15.5)
WBC: 14.3 10*3/uL — ABNORMAL HIGH (ref 4.0–10.5)
nRBC: 0 % (ref 0.0–0.2)

## 2020-08-22 LAB — PROTIME-INR
INR: 1.3 — ABNORMAL HIGH (ref 0.8–1.2)
Prothrombin Time: 15.6 seconds — ABNORMAL HIGH (ref 11.4–15.2)

## 2020-08-22 LAB — GLUCOSE, CAPILLARY: Glucose-Capillary: 145 mg/dL — ABNORMAL HIGH (ref 70–99)

## 2020-08-22 MED ORDER — SODIUM CHLORIDE 0.9 % IV SOLN: INTRAVENOUS | Status: DC

## 2020-08-22 MED ORDER — LIDOCAINE HCL 1 % IJ SOLN
INTRAMUSCULAR | Status: AC
  Filled 2020-08-22: qty 20

## 2020-08-22 MED ORDER — FENTANYL CITRATE (PF) 100 MCG/2ML IJ SOLN
INTRAMUSCULAR | Status: AC
  Filled 2020-08-22: qty 2

## 2020-08-22 MED ORDER — MIDAZOLAM HCL 2 MG/2ML IJ SOLN
INTRAMUSCULAR | Status: AC
  Filled 2020-08-22: qty 4

## 2020-08-22 MED ORDER — HYDROCODONE-ACETAMINOPHEN 5-325 MG PO TABS: 1.0000 | ORAL_TABLET | ORAL | Status: DC | PRN

## 2020-08-22 MED ORDER — MIDAZOLAM HCL 2 MG/2ML IJ SOLN
INTRAMUSCULAR | Status: AC | PRN
  Administered 2020-08-22 (×2): 1 mg via INTRAVENOUS

## 2020-08-22 MED ORDER — GELATIN ABSORBABLE 12-7 MM EX MISC
CUTANEOUS | Status: AC
  Filled 2020-08-22: qty 1

## 2020-08-22 MED ORDER — FENTANYL CITRATE (PF) 100 MCG/2ML IJ SOLN
INTRAMUSCULAR | Status: AC | PRN
  Administered 2020-08-22 (×2): 50 ug via INTRAVENOUS

## 2020-08-22 MED ORDER — LIDOCAINE HCL (PF) 1 % IJ SOLN
INTRAMUSCULAR | Status: AC | PRN
  Administered 2020-08-22: 10 mL

## 2020-08-22 NOTE — Discharge Instructions (Signed)
Please call Interventional Radiology clinic [phone] with any questions or concerns.  You may remove your dressing and shower tomorrow.  Liver Biopsy, Care After These instructions give you information on caring for yourself after your procedure. Your doctor may also give you more specific instructions. Call your doctor if you have any problems or questions after your procedure. What can I expect after the procedure? After the procedure, it is common to have:  Pain and soreness where the biopsy was done.  Bruising around the area where the biopsy was done.  Sleepiness and be tired for a few days. Follow these instructions at home: Medicines  Take over-the-counter and prescription medicines only as told by your doctor.  If you were prescribed an antibiotic medicine, take it as told by your doctor. Do not stop taking the antibiotic even if you start to feel better.  Do not take medicines such as aspirin and ibuprofen. These medicines can thin your blood. Do not take these medicines unless your doctor tells you to take them.  If you are taking prescription pain medicine, take actions to prevent or treat constipation. Your doctor may recommend that you: ? Drink enough fluid to keep your pee (urine) clear or pale yellow. ? Take over-the-counter or prescription medicines. ? Eat foods that are high in fiber, such as fresh fruits and vegetables, whole grains, and beans. ? Limit foods that are high in fat and processed sugars, such as fried and sweet foods. Caring for your cut  Follow instructions from your doctor about how to take care of your cuts from surgery (incisions). Make sure you: ? Wash your hands with soap and water before you change your bandage (dressing). If you cannot use soap and water, use hand sanitizer. ? Change your bandage as told by your doctor. ? Leave stitches (sutures), skin glue, or skin tape (adhesive) strips in place. They may need to stay in place for 2 weeks  or longer. If tape strips get loose and curl up, you may trim the loose edges. Do not remove tape strips completely unless your doctor says it is okay.  Check your cuts every day for signs of infection. Check for: ? Redness, swelling, or more pain. ? Fluid or blood. ? Pus or a bad smell. ? Warmth.  Do not take baths, swim, or use a hot tub until your doctor says it is okay to do so. Activity   Rest at home for 1-2 days or as told by your doctor. ? Avoid sitting for a long time without moving. Get up to take short walks every 1-2 hours.  Return to your normal activities as told by your doctor. Ask what activities are safe for you.  Do not do these things in the first 24 hours: ? Drive. ? Use machinery. ? Take a bath or shower.  Do not lift more than 10 pounds (4.5 kg) or play contact sports for the first 2 weeks. General instructions   Do not drink alcohol in the first week after the procedure.  Have someone stay with you for at least 24 hours after the procedure.  Get your test results. Ask your doctor or the department that is doing the test: ? When will my results be ready? ? How will I get my results? ? What are my treatment options? ? What other tests do I need? ? What are my next steps?  Keep all follow-up visits as told by your doctor. This is important. Contact a doctor if:    A cut bleeds and leaves more than just a small spot of blood.  A cut is red, puffs up (swells), or hurts more than before.  Fluid or something else comes from a cut.  A cut smells bad.  You have a fever or chills. Get help right away if:  You have swelling, bloating, or pain in your belly (abdomen).  You get dizzy or faint.  You have a rash.  You feel sick to your stomach (nauseous) or throw up (vomit).  You have trouble breathing, feel short of breath, or feel faint.  Your chest hurts.  You have problems talking or seeing.  You have trouble with your balance or moving your  arms or legs. Summary  After the procedure, it is common to have pain, soreness, bruising, and tiredness.  Your doctor will tell you how to take care of yourself at home. Change your bandage, take your medicines, and limit your activities as told by your doctor.  Call your doctor if you have symptoms of infection. Get help right away if your belly swells, your cut bleeds a lot, or you have trouble talking or breathing. This information is not intended to replace advice given to you by your health care provider. Make sure you discuss any questions you have with your health care provider. Document Revised: 10/29/2017 Document Reviewed: 10/29/2017 Elsevier Patient Education  2020 [employer]   Moderate Conscious Sedation, Adult, Care After These instructions provide you with information about caring for yourself after your procedure. Your health care provider may also give you more specific instructions. Your treatment has been planned according to current medical practices, but problems sometimes occur. Call your health care provider if you have any problems or questions after your procedure. What can I expect after the procedure? After your procedure, it is common:  To feel sleepy for several hours.  To feel clumsy and have poor balance for several hours.  To have poor judgment for several hours.  To vomit if you eat too soon. Follow these instructions at home: For at least 24 hours after the procedure:   Do not: ? Participate in activities where you could fall or become injured. ? Drive. ? Use heavy machinery. ? Drink alcohol. ? Take sleeping pills or medicines that cause drowsiness. ? Make important decisions or sign legal documents. ? Take care of children on your own.  Rest. Eating and drinking  Follow the diet recommended by your health care provider.  If you vomit: ? Drink water, juice, or soup when you can drink without vomiting. ? Make sure you have little or no  nausea before eating solid foods. General instructions  Have a responsible adult stay with you until you are awake and alert.  Take over-the-counter and prescription medicines only as told by your health care provider.  If you smoke, do not smoke without supervision.  Keep all follow-up visits as told by your health care provider. This is important. Contact a health care provider if:  You keep feeling nauseous or you keep vomiting.  You feel light-headed.  You develop a rash.  You have a fever. Get help right away if:  You have trouble breathing. This information is not intended to replace advice given to you by your health care provider. Make sure you discuss any questions you have with your health care provider. Document Revised: 10/01/2017 Document Reviewed: 02/08/2016 [employer] Patient Education  2020 [employer]  

## 2020-08-22 NOTE — Progress Notes (Signed)
RN contacted Jesse Wilcox sister Jesse Wilcox to clarify transportation home and update on patient procedure today.  [name] stated patient was not having liver biopsy today as he has already had a liver biopsy.  [name] states "Dr. Benay Spice told me he was having a biopsy of his prostate, that's where he is having his problem".  Maryln Manuel, PA notified to contact patients sister Jesse Wilcox to clarify.

## 2020-08-22 NOTE — Procedures (Signed)
Interventional Radiology Procedure Note  Procedure: [place] LEFT LIVER MASS BX    Complications: None  Estimated Blood Loss:  MIN  Findings: 18 G CORES X 3    M. Daryll Brod, MD

## 2020-08-22 NOTE — Progress Notes (Signed)
Provided 1 complementary case of Ensure Enlive. 

## 2020-08-22 NOTE — H&P (Addendum)
Chief Complaint: Patient was seen in consultation today for liver mass/biopsy.  Referring Physician(s): Owens Shark  Supervising Physician: Daryll Brod  Patient Status: [facility] - Out-pt  History of Present Illness: Jesse Wilcox is a 69 y.o. male with a past medical history of hypertension, hyperlipidemia, chronic hepatitis C, HCC, cirrhosis, and diabetes mellitus. He was found to have elevated LFTs on recent blood work. He was sent for imaging studies for further evaluation which revealed a large liver mass concerning for [facility], along with a soft tissue mass long his left seventh rib- concerning for metastatic disease. He was referred to oncology for further management who recommended biopsy of left chest soft tissue mass for tissue diagnosis- this was done in IR 08/07/2020, pathology revealed a poorly differentiated carcinoma. Due to lack of chest soft tissue mass result defining a potential origin, request is made for liver mass biopsy for tissue diagnosis.  US abdomen limited RUQ 07/03/2020: 1. Liver contour nodularity and coarsened echotexture concerning for cirrhosis. There is a masslike area in the left hepatic lobe measuring up to 7.7 cm which is indeterminate. Liver MRI is recommended for further evaluation.  MR abdomen 07/17/2020: 1. Large infiltrative mass in the LEFT hepatic lobe in a patient with history of hepatitis C showing portal venous invasion, likely infiltrative hepatocellular carcinoma but characterized as LI-RADS category M, given the lack of well-defined lesion meeting LI-RADS 5 criteria. Would correlate with alpha fetoprotein. Biopsy of distant disease may also be helpful for further management. 2. Nodal and chest wall metastatic disease. Pattern of nodal disease suggests there may be further disease in the chest and chest wall disease is incompletely imaged on the current study. Cirrhosis with signs of portal hypertension.  CT chest/abdomen/pelvis 08/05/2020: 1.  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 2. LEFT chest wall mass with soft tissue expansion of the seventh rib. 3. Scattered subcentimeter pulmonary nodules. Two of the nodules were imaged and stable on comparison CT abdomen from 2014. Favor benign nodules. Recommend attention on follow-up. 4. Centrilobular emphysema in the the upper lobes. 5. Large infiltrative mass in the LEFT hepatic lobe most consistent with hepatocellular carcinoma. 6. Bulky periportal and periaortic metastatic adenopathy. 7. Cirrhotic liver.  IR requested by Ned Card, NP for possible image-guided liver mass biopsy. Patient awake and alert laying in bed watching TV with no complaints at this time. Denies fever, chills, chest pain, dyspnea, abdominal pain, or headache.   Past Medical History:  Diagnosis Date  . Carotid artery occlusion   . Chronic hepatitis C ([place])   . Diabetes mellitus     Past Surgical History:  Procedure Laterality Date  . Aortic arch angiogram  04-15-12   By Dr. Einar Gip  . ARCH AORTOGRAM  04/15/2012   Procedure: ARCH AORTOGRAM;  Surgeon: Laverda Page, MD;  Location: [facility] CATH LAB;  Service: Cardiovascular;;  . CAROTID ANGIOGRAM N/A 04/15/2012   Procedure: CAROTID ANGIOGRAM;  Surgeon: Laverda Page, MD;  Location: [facility] CATH LAB;  Service: Cardiovascular;  Laterality: N/A;  . CAROTID STENT INSERTION N/A 08/23/2012   Procedure: CAROTID STENT INSERTION;  Surgeon: Elam Dutch, MD;  Location: [facility] CATH LAB;  Service: Cardiovascular;  Laterality: N/A;    Allergies: Patient has no known allergies.  Medications: Prior to Admission medications   Medication Sig Start Date End Date Taking? Authorizing Provider  amLODipine (NORVASC) 10 MG tablet Take 1 tablet (10 mg total) by mouth daily. Patient taking differently: Take 10 mg by mouth at bedtime.  07/04/20   Charlott Rakes, MD  aspirin EC 81 MG tablet Take 1 tablet (81 mg total) by mouth daily. Patient taking  differently: Take 81 mg by mouth at bedtime.  03/12/20   Elsie Stain, MD  atorvastatin (LIPITOR) 40 MG tablet Take 1 tablet (40 mg total) by mouth daily. Patient taking differently: Take 40 mg by mouth at bedtime.  07/04/20   Charlott Rakes, MD  Blood Glucose Monitoring Suppl (TRUE METRIX METER) w/Device KIT Use to check blood sugar as directed 12/09/15   Charlott Rakes, MD  gabapentin (NEURONTIN) 300 MG capsule Take 1 capsule (300 mg total) by mouth 3 (three) times daily. Patient not taking: Reported on 07/29/2020 06/07/20   Raylene Everts, MD  glucose blood test strip Test 3 times daily. 03/12/20   Elsie Stain, MD  insulin glargine (LANTUS SOLOSTAR) 100 UNIT/ML Solostar Pen INJECT 65 UNITS INTO THE SKIN DAILY. Patient taking differently: Inject 65 Units into the skin at bedtime.  07/04/20   Charlott Rakes, MD  Insulin Pen Needle (TRUEPLUS PEN NEEDLES) 31G X 8 MM MISC USE AS DIRECTED 4 TIMES DAILY AFTER MEALS AND AT BEDTIME 03/12/20   Elsie Stain, MD  lisinopril-hydrochlorothiazide (ZESTORETIC) 20-12.5 MG tablet Take 1 tablet by mouth daily. Patient taking differently: Take 1 tablet by mouth at bedtime.  07/04/20   Charlott Rakes, MD  metFORMIN (GLUCOPHAGE) 500 MG tablet TAKE 2 TABLETS BY MOUTH 2 TIMES DAILY WITH A MEAL Patient taking differently: Take 1,000 mg by mouth at bedtime.  07/04/20   Charlott Rakes, MD  Multiple Vitamin (MULTIVITAMIN WITH MINERALS) TABS Take 1 tablet by mouth at bedtime. Centrum Silver    [provider]  oxyCODONE (OXY IR/ROXICODONE) 5 MG immediate release tablet Take 1 tablet (5 mg total) by mouth every 4 (four) hours as needed for severe pain. Do not drive while taking 48/88/91   Owens Shark, NP  TRUEplus Lancets 28G MISC Use as directed 03/12/20   Elsie Stain, MD     Family History  Problem Relation Age of Onset  . Diabetes Mother   . Heart disease Mother   . Hypertension Mother   . Heart attack Mother   . Diabetes Father   . Heart  disease Father   . Hypertension Father   . Diabetes Sister   . Hyperlipidemia Sister   . Heart disease Sister   . Heart attack Sister   . Peripheral vascular disease Sister   . Hyperlipidemia Brother   . Hypertension Brother     Social History   Socioeconomic History  . Marital status: Single    Spouse name: Not on file  . Number of children: Not on file  . Years of education: Not on file  . Highest education level: Not on file  Occupational History  . Not on file  Tobacco Use  . Smoking status: Current Every Day Smoker    Packs/day: 1.00    Years: 47.00    Pack years: 47.00    Types: Cigarettes  . Smokeless tobacco: Never Used  Vaping Use  . Vaping Use: Never used  Substance and Sexual Activity  . 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 Drug use: No  . Sexual activity: Not on file  Other Topics Concern  . Not on file  Social History Narrative  . Not on file   Social Determinants of Health   Financial Resource  Strain:   . Difficulty of Paying Living Expenses: Not on file  Food Insecurity:   . Worried About Charity fundraiser in the Last Year: Not on file  . Ran Out of Food in the Last Year: Not on file  Transportation Needs:   . Lack of Transportation (Medical): Not on file  . Lack of Transportation (Non-Medical): Not on file  Physical Activity:   . Days of Exercise per Week: Not on file  . Minutes of Exercise per Session: Not on file  Stress:   . Feeling of Stress : Not on file  Social Connections:   . Frequency of Communication with Friends and Family: Not on file  . Frequency of Social Gatherings with Friends and Family: Not on file  . Attends Religious Services: Not on file  . Active Member of Clubs or Organizations: Not on file  . Attends Archivist Meetings: Not on file  . Marital Status: Not on file     Review of Systems: A 12 point ROS discussed and pertinent positives are indicated in  the HPI above.  All other systems are negative.  Review of Systems  Constitutional: Negative for chills and fever.  Respiratory: Negative for shortness of breath and wheezing.   Cardiovascular: Negative for chest pain and palpitations.  Gastrointestinal: Negative for abdominal pain.  Neurological: Negative for headaches.  Psychiatric/Behavioral: Negative for behavioral problems and confusion.    Vital Signs: There were no vitals taken for this visit.  Physical Exam Vitals and nursing note reviewed.  Constitutional:      General: He is not in acute distress.    Appearance: Normal appearance.  Cardiovascular:     Rate and Rhythm: Normal rate and regular rhythm.     Heart sounds: Normal heart sounds. No murmur heard.   Pulmonary:     Effort: Pulmonary effort is normal. No respiratory distress.     Breath sounds: Normal breath sounds. No wheezing.  Skin:    General: Skin is warm and dry.  Neurological:     Mental Status: He is alert and oriented to person, place, and time.      MD Evaluation Airway: WNL Heart: WNL Abdomen: WNL Chest/ Lungs: WNL ASA  Classification: 3 Mallampati/Airway Score: One   Imaging: CT CHEST W CONTRAST  Result Date: 08/05/2020 CLINICAL DATA:  Infiltrative liver mass. Hepatitis C. Metastatic adenopathy and chest wall mass. Mildly elevated alpha fetoprotein. EXAM: CT CHEST, ABDOMEN, AND PELVIS WITH CONTRAST TECHNIQUE: Multidetector CT imaging of the chest, abdomen and pelvis was performed following the standard protocol during bolus administration of intravenous contrast. CONTRAST:  110m OMNIPAQUE IOHEXOL 300 MG/ML  SOLN COMPARISON:  MRI 07/17/2020 FINDINGS: CT CHEST FINDINGS Cardiovascular: Coronary artery calcification and aortic atherosclerotic calcification. Mediastinum/Nodes: Enlarged retrocrural lymph nodes along the esophagus below the carina. For example 16 mm node on image 63/7 and 11 mm node on image 71/7. Lungs/Pleura: Several noncalcified  RIGHT lung nodules noted: 3 mm RIGHT lower lobe nodule (image 83/series 11) 5 mm RIGHT lower lobe nodule (image 71) 3 mm RIGHT upper lobe nodule (image 67) 5 mm RIGHT middle lobe nodule (image 82) 3 mm lingular nodule (image 90) Two of the RIGHT lung of these nodules were imaged on comparison abdominal CT from 03/02/2013 and are unchanged. Musculoskeletal: A chest wall mass involving the posterolateral LEFT seventh rib with bone destruction and soft tissue expansion measures 5.6 by 3.2 cm (image 81/11). CT ABDOMEN PELVIS FINDINGS Hepatobiliary: infiltrative mass occupying large portion of  the LEFT hepatic lobe measures 11.6 by 8.1 cm (image 86/7). No known lesion in the RIGHT hepatic lobe is not well-defined on CT imaging. Better depicted on comparison MRI. Liver has a lobular contour consistent with cirrhosis. Enlarged periportal lymph node again demonstrated measuring 3 cm short axis. Gastrohepatic ligament nodes measuring 2.8 cm again noted. Celiac trunk lymph node measuring 17 mm on image 117. Pancreas: No pancreatic lesion. Spleen: Normal spleen. Adrenals/Urinary Tract: Adrenal glands normal. Kidneys enhance symmetrically. Ureters bladder normal. Stomach/Bowel: Stomach small-bowel appendix and cecum normal. Moderate volume stool in the colon. No obstructing lesion. Vascular/Lymphatic: Abdominal aorta is normal caliber with atherosclerotic calcification. Periportal and upper abdominal retroperitoneal Periaortic adenopathy is described in the hepatic biliary section. No iliac adenopathy. Reproductive: Prostate unremarkable. Other: No peritoneal metastasis. Musculoskeletal: No additional skeletal metastasis identified other than the LEFT chest wall mass. IMPRESSION: Chest Impression: 1. 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 2. LEFT chest wall mass with soft tissue expansion of the seventh rib. 3. Scattered subcentimeter pulmonary nodules. Two of the nodules were imaged and stable on  comparison CT abdomen from 2014. Favor benign nodules. Recommend attention on follow-up. 4. Centrilobular emphysema in the the upper lobes. Abdomen / Pelvis Impression: 1. Large infiltrative mass in the LEFT hepatic lobe most consistent with hepatocellular carcinoma. 2. Bulky periportal and periaortic metastatic adenopathy. 3. Cirrhotic liver. Electronically Signed   By: Suzy Bouchard M.D.   On: 08/05/2020 08:50   CT ANGIO LOW EXTREM LEFT W &/OR WO CONTRAST  Result Date: 08/10/2020 CLINICAL DATA:  Bilateral groin pain the past 2 days. Evaluate for peripheral arterial disease. EXAM: CT ANGIOGRAPHY OF ABDOMINAL AORTA WITH ILIOFEMORAL RUNOFF TECHNIQUE: Multidetector CT imaging of the abdomen, pelvis and lower extremities was performed using the standard protocol during bolus administration of intravenous contrast. Multiplanar CT image reconstructions and MIPs were obtained to evaluate the vascular anatomy. CONTRAST:  171m OMNIPAQUE IOHEXOL 350 MG/ML SOLN COMPARISON:  CT abdomen and pelvis-08/05/2020 FINDINGS: VASCULAR Aorta: There is a large amount of irregular atherosclerotic plaque within the imaged distal most aspect of the abdominal aorta. _________________________________________________________ RIGHT Lower Extremity Inflow: There is a moderate amount of eccentric mixed calcified and noncalcified atherosclerotic plaque involving the normal caliber right common iliac artery, not resulting in a hemodynamically significant stenosis. The right internal iliac artery is diseased though patent and of normal caliber. Eccentric predominantly noncalcified atherosclerotic plaque results in approximately 50% luminal narrowing involving the proximal aspect of the right external iliac artery (coronal image 29, series 7), as well as the mid/distal aspect of the right external iliac artery (coronal image 28, series 7). Outflow: There is a minimal amount of eccentric mixed calcified and noncalcified atherosclerotic plaque  within the right common femoral artery, not resulting in hemodynamically significant stenosis. The right deep femoral artery is disease though pain and of normal caliber. There is a moderate amount of scattered irregular atherosclerotic plaque scattered throughout the right superficial femoral artery. Tandem areas of at least 50% luminal narrowing are seen at the level of the right adductor canal (image 141, 146 and 161, series 5). The right above and below-knee popliteal artery is diseased though patent of normal caliber and without a hemodynamically significant narrowing. Runoff: The anterior tibial artery occludes shortly after its origin. Two vessel runoff to the right lower leg however the peroneal artery occludes at the level of the ankle mortise. Predominant arterial supply to the foot is via the right posterior tibial artery. A patent right dorsalis pedis artery is  not identified. No discrete intraluminal filling defects to suggest distal embolism. _________________________________________________________ LEFT Lower Extremity Inflow: There is a moderate amount of eccentric mixed calcified and noncalcified atherosclerotic plaque involving the normal caliber left common iliac artery, not resulting in a hemodynamically significant stenosis. The left internal iliac artery is disease though patent and of normal caliber. There is a moderate to large amount of eccentric mixed calcified and noncalcified atherosclerotic plaque scattered throughout the left external iliac artery which approaches 50% luminal narrowing at its mid/distal aspect (coronal image 27, series 7). Outflow: There is a moderate amount of eccentric calcified atherosclerotic plaque involving the left common femoral artery, not resulting in hemodynamically significant stenosis. The left deep femoral artery is disease though patent and of normal caliber. Large amount of eccentric predominantly noncalcified atherosclerotic plaque results in tandem  areas of subtotal occlusion involving the distal aspect of the left superficial femoral artery extending to the level of the abductor canal (representative images 129, 141 and 156, series 5). Eccentric mixed calcified and noncalcified atherosclerotic plaque is seen throughout left above and below-knee popliteal artery, not resulting in a hemodynamically significant stenosis. Runoff: The left anterior tibial artery occludes at the level of the mid calf. Two vessel runoff to the left lower leg however the left peroneal artery occludes at the level of the ankle mortise. Predominant arterial supply is via the left posterior tibial artery which is patent to the level of the forefoot. No discrete lumen filling defects to suggest distal embolism. Veins: The IVC and pelvic venous systems appear widely patent. Review of the MIP images confirms the above findings. _________________________________________________________ _________________________________________________________ NON-VASCULAR Evaluation of the abdominal organs is limited to the arterial phase of enhancement. Stomach/Bowel: Enteric contrast is seen within the sigmoid colon. Moderate colonic stool burden without evidence of enteric obstruction. Normal appearance of the terminal ileum and appendix. Lymphatic: No bulky pelvic or inguinal lymphadenopathy. Reproductive: Normal appearance the prostate gland. No free fluid the pelvic cul-de-sac. Other: Regional soft tissues are normal. Musculoskeletal: No acute or aggressive osseous abnormalities. Old/healed right midshaft femur fracture. Mild degenerative change involving the medial compartments of the bilateral knees as well as suspected osteochondral defect involving the medial aspect of the weight-bearing surface of the talus (coronal image 49, series 7). IMPRESSION: Vascular Impression: 1. Large amount of irregular atherosclerotic plaque within the abdominal aorta. Aortic Atherosclerosis (ICD10-I70.0). Right lower  extremity vascular Impression: 1. Suspected tandem areas of at least 50% luminal narrowing involving the right external iliac artery. 2. Tandem areas of at least 50% luminal narrowing involving the right superficial femoral artery at the level of the abductor canal. 3. Two vessel runoff to the right lower leg via the right peroneal and posterior tibial arteries however the predominant arterial supply to the right foot is via the right posterior tibial artery. A patent right dorsalis pedis artery is not identified. No discrete intraluminal filling defects to suggest distal embolism. Left lower extremity vascular Impression: 1. Suspected 50% luminal narrowing involving the mid/distal aspect of the left external iliac artery. 2. Large amount of atherosclerotic plaque results in tandem areas of subtotal occlusion involving the distal aspect left superficial artery. 3. Two vessel runoff to the left lower leg via the left peroneal and posterior tibial arteries however the predominant arterial supply to the left foot is via the left posterior tibial artery. A patent left-sided dorsalis pedis artery is not identified. No discrete lumen filling defects to suggest distal embolism. Electronically Signed   By: Eldridge Abrahams.D.  On: 08/10/2020 15:11   CT ANGIO LOW EXTREM RIGHT W &/OR WO CONTRAST  Result Date: 08/10/2020 CLINICAL DATA:  Bilateral groin pain the past 2 days. Evaluate for peripheral arterial disease. EXAM: CT ANGIOGRAPHY OF ABDOMINAL AORTA WITH ILIOFEMORAL RUNOFF TECHNIQUE: Multidetector CT imaging of the abdomen, pelvis and lower extremities was performed using the standard protocol during bolus administration of intravenous contrast. Multiplanar CT image reconstructions and MIPs were obtained to evaluate the vascular anatomy. CONTRAST:  163m OMNIPAQUE IOHEXOL 350 MG/ML SOLN COMPARISON:  CT abdomen and pelvis-08/05/2020 FINDINGS: VASCULAR Aorta: There is a large amount of irregular atherosclerotic plaque  within the imaged distal most aspect of the abdominal aorta. _________________________________________________________ RIGHT Lower Extremity Inflow: There is a moderate amount of eccentric mixed calcified and noncalcified atherosclerotic plaque involving the normal caliber right common iliac artery, not resulting in a hemodynamically significant stenosis. The right internal iliac artery is diseased though patent and of normal caliber. Eccentric predominantly noncalcified atherosclerotic plaque results in approximately 50% luminal narrowing involving the proximal aspect of the right external iliac artery (coronal image 29, series 7), as well as the mid/distal aspect of the right external iliac artery (coronal image 28, series 7). Outflow: There is a minimal amount of eccentric mixed calcified and noncalcified atherosclerotic plaque within the right common femoral artery, not resulting in hemodynamically significant stenosis. The right deep femoral artery is disease though pain and of normal caliber. There is a moderate amount of scattered irregular atherosclerotic plaque scattered throughout the right superficial femoral artery. Tandem areas of at least 50% luminal narrowing are seen at the level of the right adductor canal (image 141, 146 and 161, series 5). The right above and below-knee popliteal artery is diseased though patent of normal caliber and without a hemodynamically significant narrowing. Runoff: The anterior tibial artery occludes shortly after its origin. Two vessel runoff to the right lower leg however the peroneal artery occludes at the level of the ankle mortise. Predominant arterial supply to the foot is via the right posterior tibial artery. A patent right dorsalis pedis artery is not identified. No discrete intraluminal filling defects to suggest distal embolism. _________________________________________________________ LEFT Lower Extremity Inflow: There is a moderate amount of eccentric mixed  calcified and noncalcified atherosclerotic plaque involving the normal caliber left common iliac artery, not resulting in a hemodynamically significant stenosis. The left internal iliac artery is disease though patent and of normal caliber. There is a moderate to large amount of eccentric mixed calcified and noncalcified atherosclerotic plaque scattered throughout the left external iliac artery which approaches 50% luminal narrowing at its mid/distal aspect (coronal image 27, series 7). Outflow: There is a moderate amount of eccentric calcified atherosclerotic plaque involving the left common femoral artery, not resulting in hemodynamically significant stenosis. The left deep femoral artery is disease though patent and of normal caliber. Large amount of eccentric predominantly noncalcified atherosclerotic plaque results in tandem areas of subtotal occlusion involving the distal aspect of the left superficial femoral artery extending to the level of the abductor canal (representative images 129, 141 and 156, series 5). Eccentric mixed calcified and noncalcified atherosclerotic plaque is seen throughout left above and below-knee popliteal artery, not resulting in a hemodynamically significant stenosis. Runoff: The left anterior tibial artery occludes at the level of the mid calf. Two vessel runoff to the left lower leg however the left peroneal artery occludes at the level of the ankle mortise. Predominant arterial supply is via the left posterior tibial artery which is patent to the level  of the forefoot. No discrete lumen filling defects to suggest distal embolism. Veins: The IVC and pelvic venous systems appear widely patent. Review of the MIP images confirms the above findings. _________________________________________________________ _________________________________________________________ NON-VASCULAR Evaluation of the abdominal organs is limited to the arterial phase of enhancement. Stomach/Bowel: Enteric  contrast is seen within the sigmoid colon. Moderate colonic stool burden without evidence of enteric obstruction. Normal appearance of the terminal ileum and appendix. Lymphatic: No bulky pelvic or inguinal lymphadenopathy. Reproductive: Normal appearance the prostate gland. No free fluid the pelvic cul-de-sac. Other: Regional soft tissues are normal. Musculoskeletal: No acute or aggressive osseous abnormalities. Old/healed right midshaft femur fracture. Mild degenerative change involving the medial compartments of the bilateral knees as well as suspected osteochondral defect involving the medial aspect of the weight-bearing surface of the talus (coronal image 49, series 7). IMPRESSION: Vascular Impression: 1. Large amount of irregular atherosclerotic plaque within the abdominal aorta. Aortic Atherosclerosis (ICD10-I70.0). Right lower extremity vascular Impression: 1. Suspected tandem areas of at least 50% luminal narrowing involving the right external iliac artery. 2. Tandem areas of at least 50% luminal narrowing involving the right superficial femoral artery at the level of the abductor canal. 3. Two vessel runoff to the right lower leg via the right peroneal and posterior tibial arteries however the predominant arterial supply to the right foot is via the right posterior tibial artery. A patent right dorsalis pedis artery is not identified. No discrete intraluminal filling defects to suggest distal embolism. Left lower extremity vascular Impression: 1. Suspected 50% luminal narrowing involving the mid/distal aspect of the left external iliac artery. 2. Large amount of atherosclerotic plaque results in tandem areas of subtotal occlusion involving the distal aspect left superficial artery. 3. Two vessel runoff to the left lower leg via the left peroneal and posterior tibial arteries however the predominant arterial supply to the left foot is via the left posterior tibial artery. A patent left-sided dorsalis pedis  artery is not identified. No discrete lumen filling defects to suggest distal embolism. Electronically Signed   By: Sandi Mariscal M.D.   On: 08/10/2020 15:11   CT ABDOMEN PELVIS W CONTRAST  Result Date: 08/05/2020 CLINICAL DATA:  Infiltrative liver mass. Hepatitis C. Metastatic adenopathy and chest wall mass. Mildly elevated alpha fetoprotein. EXAM: CT CHEST, ABDOMEN, AND PELVIS WITH CONTRAST TECHNIQUE: Multidetector CT imaging of the chest, abdomen and pelvis was performed following the standard protocol during bolus administration of intravenous contrast. CONTRAST:  133m OMNIPAQUE IOHEXOL 300 MG/ML  SOLN COMPARISON:  MRI 07/17/2020 FINDINGS: CT CHEST FINDINGS Cardiovascular: Coronary artery calcification and aortic atherosclerotic calcification. Mediastinum/Nodes: Enlarged retrocrural lymph nodes along the esophagus below the carina. For example 16 mm node on image 63/7 and 11 mm node on image 71/7. Lungs/Pleura: Several noncalcified RIGHT lung nodules noted: 3 mm RIGHT lower lobe nodule (image 83/series 11) 5 mm RIGHT lower lobe nodule (image 71) 3 mm RIGHT upper lobe nodule (image 67) 5 mm RIGHT middle lobe nodule (image 82) 3 mm lingular nodule (image 90) Two of the RIGHT lung of these nodules were imaged on comparison abdominal CT from 03/02/2013 and are unchanged. Musculoskeletal: A chest wall mass involving the posterolateral LEFT seventh rib with bone destruction and soft tissue expansion measures 5.6 by 3.2 cm (image 81/11). CT ABDOMEN PELVIS FINDINGS Hepatobiliary: infiltrative mass occupying large portion of the LEFT hepatic lobe measures 11.6 by 8.1 cm (image 86/7). No known lesion in the RIGHT hepatic lobe is not well-defined on CT imaging. Better depicted on  comparison MRI. Liver has a lobular contour consistent with cirrhosis. Enlarged periportal lymph node again demonstrated measuring 3 cm short axis. Gastrohepatic ligament nodes measuring 2.8 cm again noted. Celiac trunk lymph node measuring 17  mm on image 117. Pancreas: No pancreatic lesion. Spleen: Normal spleen. Adrenals/Urinary Tract: Adrenal glands normal. Kidneys enhance symmetrically. Ureters bladder normal. Stomach/Bowel: Stomach small-bowel appendix and cecum normal. Moderate volume stool in the colon. No obstructing lesion. Vascular/Lymphatic: Abdominal aorta is normal caliber with atherosclerotic calcification. Periportal and upper abdominal retroperitoneal Periaortic adenopathy is described in the hepatic biliary section. No iliac adenopathy. Reproductive: Prostate unremarkable. Other: No peritoneal metastasis. Musculoskeletal: No additional skeletal metastasis identified other than the LEFT chest wall mass. IMPRESSION: Chest Impression: 1. 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 2. LEFT chest wall mass with soft tissue expansion of the seventh rib. 3. Scattered subcentimeter pulmonary nodules. Two of the nodules were imaged and stable on comparison CT abdomen from 2014. Favor benign nodules. Recommend attention on follow-up. 4. Centrilobular emphysema in the the upper lobes. Abdomen / Pelvis Impression: 1. Large infiltrative mass in the LEFT hepatic lobe most consistent with hepatocellular carcinoma. 2. Bulky periportal and periaortic metastatic adenopathy. 3. Cirrhotic liver. Electronically Signed   By: Suzy Bouchard M.D.   On: 08/05/2020 08:50   CT BIOPSY  Result Date: 08/07/2020 INDICATION: 69 year old male with history of presumed hepatocellular carcinoma in incidentally noted left seventh rib expansile soft tissue mass concerning for metastasis. EXAM: CT BIOPSY COMPARISON:  CT chest from 08/05/2020 MEDICATIONS: None. ANESTHESIA/SEDATION: Fentanyl 50 mcg IV; Versed 1 mg IV Sedation time: 11 minutes; The patient was continuously monitored during the procedure by the interventional radiology nurse under my direct supervision. CONTRAST:  None. COMPLICATIONS: None immediate. PROCEDURE: Informed consent was obtained  from the patient following an explanation of the procedure, risks, benefits and alternatives. A time out was performed prior to the initiation of the procedure. The patient was positioned prone on the CT table and a limited CT was performed for procedural planning demonstrating similar appearing expansile soft tissue mass about the left seventh rib with osseous destruction. The procedure was planned. The operative site was prepped and draped in the usual sterile fashion. Appropriate trajectory was confirmed with a 22 gauge spinal needle after the adjacent tissues were anesthetized with 1% Lidocaine with epinephrine. Under intermittent CT guidance, a 17 gauge coaxial needle was advanced into the peripheral aspect of the mass. Appropriate positioning was confirmed and a total of 3 samples were obtained with an 18 gauge core needle biopsy device. The co-axial needle was removed and hemostasis was achieved with manual compression. A limited postprocedural CT was negative for hemorrhage or additional complication. A dressing was placed. The patient tolerated the procedure well without immediate postprocedural complication. IMPRESSION: Technically successful CT guided core needle biopsy of expansile left seventh rib soft tissue mass. Ruthann Cancer, MD [facility] Electronically Signed   By: Ruthann Cancer MD   On: 08/07/2020 09:47   DG Hip Unilat W or Wo Pelvis 2-3 Views Left  Result Date: 08/10/2020 CLINICAL DATA:  Left hip pain.  No known injury. EXAM: DG HIP (WITH OR WITHOUT PELVIS) 2-3V LEFT COMPARISON:  None. FINDINGS: There is no evidence of hip fracture or dislocation. Mild narrow hip joint spaces are noted. IMPRESSION: No acute fracture or dislocation identified. Mild degenerative joint changes of left hip. Electronically Signed   By: Abelardo Diesel M.D.   On: 08/10/2020 13:18    Labs:  CBC: Recent Labs    07/29/20 1517 07/29/20 1517  08/07/20 0615 08/10/20 1210 08/10/20 1350 08/22/20 1210  WBC 15.0*  --  15.8*  --  12.6* 14.3*  HGB 13.4  --  13.1 12.2* 11.4* 12.7*  HCT 41.4   < > 42.1 36.0* 36.2* 39.2  PLT 427*  --  497*  --  360 484*   < > = values in this interval not displayed.    COAGS: Recent Labs    07/29/20 1517 08/07/20 0615  INR 1.2 1.1    BMP: Recent Labs    06/21/20 0925 07/29/20 1517 08/10/20 1210 08/10/20 1350  NA 136 134* 136 134*  K 4.7 4.3 4.2 4.5  CL 101 100 99 101  CO2 23 32  --  25  GLUCOSE 149* 123* 94 85  BUN _0 CALCIUM 8.9 9.2  --  8.5*  CREATININE 0.81 0.89 0.60* 0.81  GFRNONAA 91 >60  --  >60  GFRAA 106 >60  --   --     LIVER FUNCTION TESTS: Recent Labs    06/21/20 0925 07/29/20 1517  BILITOT 0.8 0.8  AST 41* 66*  ALT 71* 120*  ALKPHOS 325* 465*  PROT 8.4 9.9*  ALBUMIN 3.6* 2.3*     Assessment and Plan:  Liver mass and left chest soft tissue mass concerning for metastatic disease, s/p left chest soft tissue mass biopsy in IR 08/07/2020- pathology revealing a poorly differentiated carcinoma without defining a potential origin. Plan for image-guided liver mass biopsy today in IR. Patient is NPO. Afebrile. He does not take blood thinners. INR pending.  Spoke with patient's sister, Allyne Gee, via telephone [phone]) at [phone] per her request, as she requested call back regarding today's procedure. She states that patient was not supposed to have a liver mass biopsy today but a biopsy of his left groin per Dr. Benay Spice. Reviewed notes from Dr. Luberta Robertson, NP with patient which states he is for liver mass biopsy today and NM scan to evaluate left groin tomorrow. She understands and states she will reach out to Dr. Gearldine Shown office with further questions on this. All questions answered and concerns addressed.  Risks and benefits discussed with the patient including, but not limited to bleeding, infection, damage to adjacent structures or low  yield requiring additional tests. All of the patient's questions were answered, patient is agreeable to proceed. Consent signed and in chart.   Thank you for this interesting consult.  I greatly enjoyed meeting Jesse Wilcox and look forward to participating in their care.  A copy of this report was sent to the requesting provider on this date.  Electronically Signed: Earley Abide, PA-C 08/22/2020, 12:29 PM   I spent a total of 25 Minutes in face to face in clinical consultation, greater than 50% of which was counseling/coordinating care for liver mass/biopsy.

## 2020-08-23 ENCOUNTER — Ambulatory Visit (HOSPITAL_COMMUNITY)
Admission: RE | Admit: 2020-08-23 | Discharge: 2020-08-23 | Disposition: A | Discharge status: Home / Self Care | Payer: Medicare Other | Source: Ambulatory Visit | Attending: Nurse Practitioner | Admitting: Nurse Practitioner

## 2020-08-23 ENCOUNTER — Encounter (HOSPITAL_COMMUNITY)
Admission: RE | Admit: 2020-08-23 | Discharge: 2020-08-23 | Disposition: A | Discharge status: Home / Self Care | Payer: Medicare Other | Source: Ambulatory Visit | Attending: Nurse Practitioner | Admitting: Nurse Practitioner

## 2020-08-23 DIAGNOSIS — M170 Bilateral primary osteoarthritis of knee: Secondary | ICD-10-CM | POA: Diagnosis not present

## 2020-08-23 DIAGNOSIS — C7951 Secondary malignant neoplasm of bone: Secondary | ICD-10-CM | POA: Diagnosis not present

## 2020-08-23 DIAGNOSIS — C220 Liver cell carcinoma: Secondary | ICD-10-CM | POA: Diagnosis not present

## 2020-08-23 MED ORDER — TECHNETIUM TC 99M MEDRONATE IV KIT
20.4000 | PACK | Freq: Once | INTRAVENOUS | Status: AC | PRN
  Administered 2020-08-23: 20.4 via INTRAVENOUS

## 2020-08-26 LAB — SURGICAL PATHOLOGY

## 2020-08-28 ENCOUNTER — Inpatient Hospital Stay (HOSPITAL_BASED_OUTPATIENT_CLINIC_OR_DEPARTMENT_OTHER): Payer: Medicare Other | Admitting: Nurse Practitioner

## 2020-08-28 ENCOUNTER — Encounter: Payer: Self-pay | Admitting: Nurse Practitioner

## 2020-08-28 ENCOUNTER — Other Ambulatory Visit: Payer: Self-pay

## 2020-08-28 ENCOUNTER — Encounter: Payer: Self-pay | Admitting: Nutrition

## 2020-08-28 VITALS — BP 104/58 | HR 99 | Temp 97.5°F | Resp 16 | Ht 71.0 in | Wt 167.5 lb

## 2020-08-28 DIAGNOSIS — C220 Liver cell carcinoma: Secondary | ICD-10-CM

## 2020-08-28 DIAGNOSIS — E785 Hyperlipidemia, unspecified: Secondary | ICD-10-CM | POA: Diagnosis not present

## 2020-08-28 DIAGNOSIS — I959 Hypotension, unspecified: Secondary | ICD-10-CM | POA: Diagnosis not present

## 2020-08-28 DIAGNOSIS — R1030 Lower abdominal pain, unspecified: Secondary | ICD-10-CM | POA: Diagnosis not present

## 2020-08-28 DIAGNOSIS — K7460 Unspecified cirrhosis of liver: Secondary | ICD-10-CM | POA: Diagnosis not present

## 2020-08-28 DIAGNOSIS — I10 Essential (primary) hypertension: Secondary | ICD-10-CM | POA: Diagnosis not present

## 2020-08-28 MED ORDER — OXYCODONE HCL 5 MG PO TABS: 5.0000 mg | ORAL_TABLET | ORAL | 0 refills | Status: DC | PRN

## 2020-08-28 MED ORDER — SODIUM CHLORIDE 0.9 % IV SOLN
INTRAVENOUS | Status: AC
  Filled 2020-08-28 (×2): qty 250

## 2020-08-28 NOTE — Progress Notes (Signed)
[place] OFFICE PROGRESS NOTE   Diagnosis: Liver mass  INTERVAL HISTORY:   Jesse Wilcox returns as scheduled.  He is weak.  His appetite is poor.  His sister reports he has lost 10 pounds since his last appointment.  He is spending the majority of the day in bed.  He continues to have left groin pain.  He takes oxycodone as needed with good relief.  Objective:  Vital signs in last 24 hours:  Blood pressure (!) 78/54, pulse (!) 104, temperature (!) 97.5 F (36.4 C), temperature source Tympanic, resp. rate 16, height 5\' 11"  (1.803 m), weight 167 lb 8 oz (76 kg), SpO2 97 %.    HEENT: Thick white coating over tongue.  Mouth appears dry. Resp: Distant breath sounds. Cardio: Distant heart sounds. GI: Abdomen soft and nontender.  No hepatomegaly. Vascular: No leg edema.  Skin: Poor skin turgor.   Lab Results:  Lab Results  Component Value Date   WBC 14.3 (H) 08/22/2020   HGB 12.7 (L) 08/22/2020   HCT 39.2 08/22/2020   MCV 81.8 08/22/2020   PLT 484 (H) 08/22/2020   NEUTROABS 7.0 08/10/2020    Imaging:  No results found.  Medications: I have reviewed the patient's current medications.  Assessment/Plan: 1. Hepatocellular carcinoma ? MRI abdomen 07/17/2020-large infiltrative mass in the left hepatic lobe characterized asLi-Rads Mbased on lack of a well-defined lesions, meeting LI-RADS 5criteria, small right hepatic lesions, chest wall and nodal metastatic disease, changes of cirrhosis with portal hypertension and left portal vein tumor thrombus ? CTs 08/05/2020-chest wall mass involving the posterior lateral left seventh rib with bone destruction and soft tissue expansion measuring 5.6 x 3.2 cm; several noncalcified right lung nodules; enlarged retrocrural lymph nodes along the esophagus below the carina; infiltrative mass occupying a large portion of the left hepatic lobe measuring 11.6 x 8.1 cm; liver has a lobular contour consistent with cirrhosis; enlarged  periportal lymph node; gastrohepatic ligament nodes measuring 2.8 cm; celiac trunk lymph node measuring 17 mm ? 08/07/2020 biopsy left chest wall mass-poorly differentiated carcinoma, the immunophenotype indicates a carcinoma but fails to further define a potential origin ? Liver biopsy 08/22/2020-poorly differentiated carcinoma with marked inflammatory response, tumor cells positive for CK AE1/AE3 and CK 8/18; negative for CK7, CK20, arginase, glypican-3 and HepPar 1 ? Bone scan 08/23/2020-uptake at lytic metastases of the lateral left seventh rib and of the left ischium corresponding to lytic lesions on most recent CT.  Uptake at upper to mid cervical spine may be degenerative but metastatic disease is not excluded 2. Cirrhosis 3. History of hepatitis C-treated 4. Remote history of alcohol use 5. Hypertension 6. Hyperlipidemia 7. Anorexia/weight loss  8. Left groin pain-taking oxycodone as needed  Disposition: Jesse Wilcox appears to have metastatic hepatocellular carcinoma based on the clinical presentation, liver and chest wall mass biopsies.  Dr. Benay Spice reviewed the liver biopsy result with Jesse Wilcox and his sister at today's visit.  We also reviewed the bone scan result.  They understand no therapy will be curative.  Dr. Benay Spice reviewed options to include a trial of immunotherapy with nivolumab versus supportive care/hospice referral.  He was unable to decide at today's visit.  He will return for a follow-up visit in 1 week for additional discussion.  He has a very poor performance status.  He is hypotensive likely due to a combination of dehydration and antihypertensive medications.  He will discontinue amlodipine and Zestoretic.  He will receive a liter of IV fluids in  the office today with repeat vital signs.  We recommend he contact Dr. Margarita Rana to discuss adjustments to the diabetic regimen.  He will return for a follow-up visit in 1 week.  We are available to see him sooner if  needed.  Patient seen with Dr. Benay Spice.   Ned Card ANP/GNP-BC   08/28/2020  10:56 AM  This was a shared visit with Ned Card.  Jesse Wilcox was interviewed and examined.  The pathology from the liver and chest wall biopsies appear similar and are consistent with a diagnosis of metastatic hepatocellular carcinoma.  We discussed treatment options with Jesse Wilcox including observation/comfort care versus a trial of systemic therapy.  He has hypotension today.  Blood pressure medications will be discontinued and he will receive intravenous fluids.  He will not be a candidate for systemic therapy unless his performance status improves.  Julieanne Manson, MD

## 2020-08-28 NOTE — Progress Notes (Signed)
Provided one complimentary case of ensure enlive. 

## 2020-08-29 ENCOUNTER — Telehealth: Payer: Self-pay | Admitting: Nurse Practitioner

## 2020-08-29 NOTE — Telephone Encounter (Signed)
Scheduled appointment per 10/27 los. Spoke to patient's sister who is aware of appointment date and time.

## 2020-08-30 ENCOUNTER — Telehealth: Payer: Self-pay | Admitting: *Deleted

## 2020-08-30 NOTE — Telephone Encounter (Addendum)
Pharmacy does not fill narcotics. Will need to send his oxycodone/apap elsewhere. Noted the script was sent on 08/28/20 to [facility]

## 2020-09-04 ENCOUNTER — Other Ambulatory Visit: Payer: Self-pay

## 2020-09-04 ENCOUNTER — Inpatient Hospital Stay: Payer: Medicare Other | Attending: Oncology | Admitting: Nurse Practitioner

## 2020-09-04 VITALS — BP 124/68 | HR 102 | Temp 97.0°F | Resp 17 | Ht 71.0 in | Wt 167.6 lb

## 2020-09-04 DIAGNOSIS — Z8619 Personal history of other infectious and parasitic diseases: Secondary | ICD-10-CM | POA: Diagnosis not present

## 2020-09-04 DIAGNOSIS — C220 Liver cell carcinoma: Secondary | ICD-10-CM | POA: Diagnosis not present

## 2020-09-04 DIAGNOSIS — C7951 Secondary malignant neoplasm of bone: Secondary | ICD-10-CM | POA: Insufficient documentation

## 2020-09-04 DIAGNOSIS — K7460 Unspecified cirrhosis of liver: Secondary | ICD-10-CM | POA: Insufficient documentation

## 2020-09-04 DIAGNOSIS — R630 Anorexia: Secondary | ICD-10-CM | POA: Insufficient documentation

## 2020-09-04 DIAGNOSIS — R918 Other nonspecific abnormal finding of lung field: Secondary | ICD-10-CM | POA: Diagnosis not present

## 2020-09-04 DIAGNOSIS — R590 Localized enlarged lymph nodes: Secondary | ICD-10-CM | POA: Insufficient documentation

## 2020-09-04 DIAGNOSIS — G893 Neoplasm related pain (acute) (chronic): Secondary | ICD-10-CM | POA: Insufficient documentation

## 2020-09-04 DIAGNOSIS — R0789 Other chest pain: Secondary | ICD-10-CM | POA: Diagnosis not present

## 2020-09-04 DIAGNOSIS — Z79899 Other long term (current) drug therapy: Secondary | ICD-10-CM | POA: Insufficient documentation

## 2020-09-04 DIAGNOSIS — R1032 Left lower quadrant pain: Secondary | ICD-10-CM | POA: Insufficient documentation

## 2020-09-04 DIAGNOSIS — E785 Hyperlipidemia, unspecified: Secondary | ICD-10-CM | POA: Insufficient documentation

## 2020-09-04 DIAGNOSIS — I10 Essential (primary) hypertension: Secondary | ICD-10-CM | POA: Insufficient documentation

## 2020-09-04 MED ORDER — OXYCODONE HCL 5 MG PO TABS: 5.0000 mg | ORAL_TABLET | ORAL | 0 refills | Status: DC | PRN

## 2020-09-04 MED ORDER — PROMETHAZINE HCL 12.5 MG PO TABS: 12.5000 mg | ORAL_TABLET | Freq: Three times a day (TID) | ORAL | 0 refills | Status: AC | PRN

## 2020-09-04 NOTE — Progress Notes (Addendum)
[place] OFFICE PROGRESS NOTE   Diagnosis: Liver mass  INTERVAL HISTORY:   Jesse Wilcox returns as scheduled.  At the time of his last visit 08/28/2020 we discussed observation/comfort care versus a trial of systemic therapy.  He was not felt to be a candidate for systemic therapy unless his performance status improved.  He is seen today to reevaluate.  Appetite and energy remain poor.  He is spending the majority of the day in bed/laying down.  He reports recent pain at the left lower anterior ribs.  Pain medicine is effective.  He needs a refill.  He has discontinued his blood pressure medications.  He has also discontinued diabetes medications.  His sister reports blood sugars typically 104 or 105.  Objective:  Vital signs in last 24 hours:  Blood pressure 124/68, pulse (!) 102, temperature (!) 97 F (36.1 C), temperature source Tympanic, resp. rate 17, height 5\' 11"  (1.803 m), weight 167 lb 9.6 oz (76 kg), SpO2 100 %.    Resp: Lungs clear bilaterally. Cardio: Regular rate and rhythm. GI: No hepatomegaly. Vascular: No leg edema. Musculoskeletal: No significant tenderness over the lower left anterior ribs.    Lab Results:  Lab Results  Component Value Date   WBC 14.3 (H) 08/22/2020   HGB 12.7 (L) 08/22/2020   HCT 39.2 08/22/2020   MCV 81.8 08/22/2020   PLT 484 (H) 08/22/2020   NEUTROABS 7.0 08/10/2020    Imaging:  No results found.  Medications: I have reviewed the patient's current medications.  Assessment/Plan: 1. Hepatocellular carcinoma ? MRI abdomen 07/17/2020-large infiltrative mass in the left hepatic lobe characterized asLi-Rads Mbased on lack of a well-defined lesions, meeting LI-RADS 5criteria, small right hepatic lesions, chest wall and nodal metastatic disease, changes of cirrhosis with portal hypertension and left portal vein tumor thrombus ? CTs 08/05/2020-chest wall mass involving the posterior lateral left seventh rib with bone  destruction and soft tissue expansion measuring 5.6 x 3.2 cm; several noncalcified right lung nodules; enlarged retrocrural lymph nodes along the esophagus below the carina; infiltrative mass occupying a large portion of the left hepatic lobe measuring 11.6 x 8.1 cm; liver has a lobular contour consistent with cirrhosis; enlarged periportal lymph node; gastrohepatic ligament nodes measuring 2.8 cm; celiac trunk lymph node measuring 17 mm ? 08/07/2020 biopsy left chest wall mass-poorly differentiated carcinoma, the immunophenotype indicates a carcinoma but fails to further define a potential origin ? Liver biopsy 08/22/2020-poorly differentiated carcinoma with marked inflammatory response, tumor cells positive for CK AE1/AE3 and CK 8/18; negative for CK7, CK20, arginase, glypican-3 and HepPar 1 ? Bone scan 08/23/2020-uptake at lytic metastases of the lateral left seventh rib and of the left ischium corresponding to lytic lesions on most recent CT.  Uptake at upper to mid cervical spine may be degenerative but metastatic disease is not excluded 2. Cirrhosis 3. History of hepatitis C-treated 4. Remote history of alcohol use 5. Hypertension 6. Hyperlipidemia 7. Anorexia/weight loss 8. Left groin pain-taking oxycodone as needed  Disposition: Jesse Wilcox appears stable.  We again reviewed the diagnosis of metastatic hepatocellular carcinoma, prognosis and treatment options with him and his sister.  He has decided against systemic therapy. He agrees with supportive/comfort care with a hospice referral.  We discussed CODE STATUS/end-of-life issues.  He will be placed on NO CODE BLUE status.  We will contact [facility] hospice today.  For pain he will continue oxycodone as needed.  He understands to contact the office if this is not effective.  He will return for follow-up in 3 to 4 weeks, sooner if needed.  Patient seen with Dr. Benay Spice.    Ned Card ANP/GNP-BC   09/04/2020  11:24 AM  This was  a shared visit with Ned Card.  We discussed treatment options with Jesse Wilcox and his sister.  He has decided against a trial of systemic therapy.  He agrees to enrollment in hospice care.  We discussed CPR and ACLS issues.  He will be placed on a no CODE BLUE status.  Julieanne Manson, MD

## 2020-09-10 ENCOUNTER — Telehealth: Payer: Self-pay | Admitting: *Deleted

## 2020-09-10 ENCOUNTER — Other Ambulatory Visit: Payer: Self-pay | Admitting: Oncology

## 2020-09-10 MED ORDER — OXYCODONE HCL 10 MG PO TABS
10.0000 mg | ORAL_TABLET | ORAL | 0 refills | Status: DC | PRN
Start: 2020-09-10 — End: 2020-09-30

## 2020-09-10 NOTE — Telephone Encounter (Signed)
Per Dr. Benay Spice: Try oxycodone 20 mg every 4 hours prn. If this does not work, discuss methadone or MS Contin again with patient and Dr. Lyman Speller. MD will send new script in this evening. Notified nurse, Debroah Baller

## 2020-09-10 NOTE — Telephone Encounter (Signed)
Reports he is taking oxyir 10 mg every 4 hours and still rates his pain 7-8/10. Pain mostly in left lower anterior chest and biopsy site. Asking for changes in pain management. Talked about Methadone, but patient prefers to avoid this if possible.

## 2020-09-17 ENCOUNTER — Encounter (INDEPENDENT_AMBULATORY_CARE_PROVIDER_SITE_OTHER): Payer: Medicare Other | Admitting: Ophthalmology

## 2020-09-30 ENCOUNTER — Inpatient Hospital Stay (HOSPITAL_BASED_OUTPATIENT_CLINIC_OR_DEPARTMENT_OTHER): Payer: Medicare Other | Admitting: Oncology

## 2020-09-30 ENCOUNTER — Other Ambulatory Visit: Payer: Self-pay | Admitting: Oncology

## 2020-09-30 ENCOUNTER — Telehealth: Payer: Self-pay | Admitting: Oncology

## 2020-09-30 ENCOUNTER — Other Ambulatory Visit: Payer: Self-pay

## 2020-09-30 VITALS — BP 123/82 | HR 122 | Temp 97.5°F | Resp 16 | Ht 71.0 in | Wt 158.2 lb

## 2020-09-30 DIAGNOSIS — K7460 Unspecified cirrhosis of liver: Secondary | ICD-10-CM

## 2020-09-30 DIAGNOSIS — I10 Essential (primary) hypertension: Secondary | ICD-10-CM | POA: Diagnosis not present

## 2020-09-30 DIAGNOSIS — E785 Hyperlipidemia, unspecified: Secondary | ICD-10-CM | POA: Diagnosis not present

## 2020-09-30 DIAGNOSIS — C7951 Secondary malignant neoplasm of bone: Secondary | ICD-10-CM | POA: Diagnosis not present

## 2020-09-30 DIAGNOSIS — C220 Liver cell carcinoma: Secondary | ICD-10-CM | POA: Diagnosis not present

## 2020-09-30 DIAGNOSIS — R630 Anorexia: Secondary | ICD-10-CM | POA: Diagnosis not present

## 2020-09-30 MED ORDER — OXYCODONE HCL 5 MG PO TABS
ORAL_TABLET | ORAL | Status: AC
  Filled 2020-09-30: qty 2

## 2020-09-30 MED ORDER — OXYCODONE HCL 10 MG PO TABS
10.0000 mg | ORAL_TABLET | ORAL | 0 refills | Status: DC | PRN
Start: 2020-09-30 — End: 2020-09-30

## 2020-09-30 MED ORDER — OXYCODONE HCL 10 MG PO TABS
10.0000 mg | ORAL_TABLET | ORAL | 0 refills | Status: DC | PRN
Start: 2020-09-30 — End: 2020-10-10

## 2020-09-30 MED ORDER — OXYCODONE HCL 5 MG PO TABS
10.0000 mg | ORAL_TABLET | Freq: Once | ORAL | Status: AC
  Administered 2020-09-30: 10 mg via ORAL

## 2020-09-30 NOTE — Telephone Encounter (Signed)
Scheduled appointment per 11/29 los. Spoke to patient who is aware of appointment date and time. Gave patient calendar print out.

## 2020-09-30 NOTE — Progress Notes (Signed)
[place] OFFICE PROGRESS NOTE   Diagnosis: Hepatocellular carcinoma  INTERVAL HISTORY:   Jesse Wilcox returns as scheduled.  He is accompanied by his sister.  He has enrolled in home hospice care.  He continues to have pain at the left chest wall.  He takes oxycodone (20 mg) approximately twice daily.  He has a poor appetite.  Objective:  Vital signs in last 24 hours:  Blood pressure 123/82, pulse (!) 122, temperature (!) 97.5 F (36.4 C), temperature source Tympanic, resp. rate 16, height 5\' 11"  (1.803 m), weight 158 lb 3.2 oz (71.8 kg), SpO2 99 %.    Resp: Lungs clear bilaterally Cardio: Regular rate and rhythm GI: Wilcox hepatomegaly, nontender Vascular: Wilcox leg edema Neuro: Alert, follows commands    Lab Results:  Lab Results  Component Value Date   WBC 14.3 (H) 08/22/2020   HGB 12.7 (L) 08/22/2020   HCT 39.2 08/22/2020   MCV 81.8 08/22/2020   PLT 484 (H) 08/22/2020   NEUTROABS 7.0 08/10/2020    CMP  Lab Results  Component Value Date   NA 134 (L) 08/10/2020   K 4.5 08/10/2020   CL 101 08/10/2020   CO2 25 08/10/2020   GLUCOSE 85 08/10/2020   BUN 10 08/10/2020   CREATININE 0.81 08/10/2020   CALCIUM 8.5 (L) 08/10/2020   PROT 9.9 (H) 07/29/2020   ALBUMIN 2.3 (L) 07/29/2020   AST 66 (H) 07/29/2020   ALT 120 (H) 07/29/2020   ALKPHOS 465 (H) 07/29/2020   BILITOT 0.8 07/29/2020   GFRNONAA >60 08/10/2020   GFRAA >60 07/29/2020     Medications: I have reviewed the patient's current medications.   Assessment/Plan:  1. Hepatocellular carcinoma ? MRI abdomen 07/17/2020-large infiltrative mass in the left hepatic lobe characterized asLi-Rads Mbased on lack of a well-defined lesions, meeting LI-RADS 5criteria, small right hepatic lesions, chest wall and nodal metastatic disease, changes of cirrhosis with portal hypertension and left portal vein tumor thrombus ? CTs 08/05/2020-chest wall mass involving the posterior lateral left seventh rib with  bone destruction and soft tissue expansion measuring 5.6 x 3.2 cm; several noncalcified right lung nodules; enlarged retrocrural lymph nodes along the esophagus below the carina; infiltrative mass occupying a large portion of the left hepatic lobe measuring 11.6 x 8.1 cm; liver has a lobular contour consistent with cirrhosis; enlarged periportal lymph node; gastrohepatic ligament nodes measuring 2.8 cm; celiac trunk lymph node measuring 17 mm ? 08/07/2020 biopsy left chest wall mass-poorly differentiated carcinoma, the immunophenotype indicates a carcinoma but fails to further define a potential origin ? Liver biopsy 08/22/2020-poorly differentiated carcinoma with marked inflammatory response, tumor cells positive for CK AE1/AE3 and CK 8/18; negative for CK7, CK20, arginase, glypican-3 and HepPar 1 ? Bone scan 08/23/2020-uptake at lytic metastases of the lateral left seventh rib and of the left ischium corresponding to lytic lesions on most recent CT.  Uptake at upper to mid cervical spine may be degenerative but metastatic disease is not excluded 2. Cirrhosis 3. History of hepatitis C-treated 4. Remote history of alcohol use 5. Hypertension 6. Hyperlipidemia 7. Anorexia/weight loss 8. Pain secondary to metastatic hepatocellular carcinoma   Disposition: Jesse Wilcox has metastatic hepatocellular carcinoma.  His performance status is declining.  He is enrolled in home hospice care.  He continues to have pain related to bone metastases.  The chest wall pain appears to be related to a destructive lesion at the lateral left seventh rib.  He will contact us if oxycodone does not relieve  the pain.  We will make a radiation oncology referral as needed.  Jesse Wilcox will return for an office visit in approximately 3 weeks.  Betsy Coder, MD  09/30/2020  11:45 AM

## 2020-10-02 ENCOUNTER — Ambulatory Visit: Payer: Medicare Other | Admitting: Podiatry

## 2020-10-03 ENCOUNTER — Ambulatory Visit: Payer: Medicare Other | Admitting: Family Medicine

## 2020-10-04 ENCOUNTER — Other Ambulatory Visit: Payer: Self-pay

## 2020-10-04 ENCOUNTER — Encounter: Payer: Self-pay | Admitting: Podiatry

## 2020-10-04 ENCOUNTER — Ambulatory Visit (INDEPENDENT_AMBULATORY_CARE_PROVIDER_SITE_OTHER): Payer: Medicare Other | Admitting: Podiatry

## 2020-10-04 DIAGNOSIS — M79674 Pain in right toe(s): Secondary | ICD-10-CM | POA: Diagnosis not present

## 2020-10-04 DIAGNOSIS — E1100 Type 2 diabetes mellitus with hyperosmolarity without nonketotic hyperglycemic-hyperosmolar coma (NKHHC): Secondary | ICD-10-CM

## 2020-10-04 DIAGNOSIS — M79675 Pain in left toe(s): Secondary | ICD-10-CM

## 2020-10-04 DIAGNOSIS — B351 Tinea unguium: Secondary | ICD-10-CM | POA: Diagnosis not present

## 2020-10-04 DIAGNOSIS — Z794 Long term (current) use of insulin: Secondary | ICD-10-CM | POA: Diagnosis not present

## 2020-10-04 NOTE — Progress Notes (Signed)
This patient returns to my office for at risk foot care.  This patient requires this care by a professional since this patient will be at risk due to having diabetes type 2.    This patient is unable to cut nails himself since the patient cannot reach his nails.These nails are painful walking and wearing shoes.   Patient presents with his sister. This patient presents for at risk foot care today.  General Appearance  Alert, conversant and in no acute stress.  Vascular  Dorsalis pedis and posterior tibial  pulses are palpable  bilaterally.  Capillary return is within normal limits  bilaterally. Temperature is within normal limits  bilaterally.  Neurologic  Senn-Weinstein monofilament wire test within normal limits  bilaterally. Muscle power within normal limits bilaterally.  Nails Thick disfigured discolored nails with subungual debris  from hallux to fifth toes bilaterally. No evidence of bacterial infection or drainage bilaterally.  Orthopedic  No limitations of motion  feet .  No crepitus or effusions noted.  No bony pathology or digital deformities noted.  Skin  normotropic skin with no porokeratosis noted bilaterally.  No signs of infections or ulcers noted.     Onychomycosis  Pain in right toes  Pain in left toes  Consent was obtained for treatment procedures.   Mechanical debridement of nails 1-5  bilaterally performed with a nail nipper.  Filed with dremel without incident.  Visual inspection of the foot was performed.   Return office visit   3 months                  Told patient to return for periodic foot care and evaluation due to potential at risk complications.   Gardiner Barefoot DPM

## 2020-10-09 ENCOUNTER — Telehealth: Payer: Self-pay | Admitting: *Deleted

## 2020-10-09 NOTE — Telephone Encounter (Signed)
Needs refill on oxycodone 10 mg #2 every 4 hours. Send to [facility]. MD notified.

## 2020-10-10 ENCOUNTER — Other Ambulatory Visit: Payer: Self-pay | Admitting: Nurse Practitioner

## 2020-10-10 DIAGNOSIS — C220 Liver cell carcinoma: Secondary | ICD-10-CM

## 2020-10-10 MED ORDER — OXYCODONE HCL 10 MG PO TABS: 10.0000 mg | ORAL_TABLET | ORAL | 0 refills | Status: DC | PRN

## 2020-10-22 ENCOUNTER — Other Ambulatory Visit: Payer: Self-pay | Admitting: Nurse Practitioner

## 2020-10-22 ENCOUNTER — Inpatient Hospital Stay: Payer: Medicare Other | Attending: Oncology | Admitting: Oncology

## 2020-10-22 ENCOUNTER — Other Ambulatory Visit: Payer: Self-pay

## 2020-10-22 DIAGNOSIS — C7951 Secondary malignant neoplasm of bone: Secondary | ICD-10-CM | POA: Insufficient documentation

## 2020-10-22 DIAGNOSIS — G893 Neoplasm related pain (acute) (chronic): Secondary | ICD-10-CM | POA: Diagnosis not present

## 2020-10-22 DIAGNOSIS — K7460 Unspecified cirrhosis of liver: Secondary | ICD-10-CM | POA: Diagnosis not present

## 2020-10-22 DIAGNOSIS — B370 Candidal stomatitis: Secondary | ICD-10-CM | POA: Diagnosis not present

## 2020-10-22 DIAGNOSIS — I81 Portal vein thrombosis: Secondary | ICD-10-CM | POA: Diagnosis not present

## 2020-10-22 DIAGNOSIS — C779 Secondary and unspecified malignant neoplasm of lymph node, unspecified: Secondary | ICD-10-CM | POA: Diagnosis not present

## 2020-10-22 DIAGNOSIS — Z79899 Other long term (current) drug therapy: Secondary | ICD-10-CM | POA: Diagnosis not present

## 2020-10-22 DIAGNOSIS — C220 Liver cell carcinoma: Secondary | ICD-10-CM

## 2020-10-22 DIAGNOSIS — K766 Portal hypertension: Secondary | ICD-10-CM | POA: Insufficient documentation

## 2020-10-22 DIAGNOSIS — R634 Abnormal weight loss: Secondary | ICD-10-CM | POA: Diagnosis not present

## 2020-10-22 DIAGNOSIS — R630 Anorexia: Secondary | ICD-10-CM | POA: Diagnosis not present

## 2020-10-22 DIAGNOSIS — E785 Hyperlipidemia, unspecified: Secondary | ICD-10-CM | POA: Insufficient documentation

## 2020-10-22 MED ORDER — FLUCONAZOLE 100 MG PO TABS: 100.0000 mg | ORAL_TABLET | Freq: Every day | ORAL | 0 refills | Status: AC

## 2020-10-22 MED ORDER — OXYCODONE HCL 10 MG PO TABS: 10.0000 mg | ORAL_TABLET | ORAL | 0 refills | Status: DC | PRN

## 2020-10-22 NOTE — Progress Notes (Signed)
Jesse Wilcox OFFICE PROGRESS NOTE   Diagnosis: Hepatocellular carcinoma  INTERVAL HISTORY:   Mr. Jesse Wilcox returns for a scheduled visit.  He is here today with his sister.  He is followed by the home hospice program.  He is being visited by hospice nurse and nursing assistance.  He continues to have pain, chiefly at the left chest wall.  He is now maintained on methadone 3 times per day and oxycodone for breakthrough pain.  He continues to have significant pain.  He also has pain at the left upper leg that is worse with weightbearing.  He continues to live alone and is cared for by his sister and a friend.  Objective:  Vital signs in last 24 hours:  Blood pressure 102/62, pulse (!) 122, temperature (!) 95.9 F (35.5 C), temperature source Tympanic, resp. rate 16, height 5\' 11"  (1.803 m), weight 162 lb 9.6 oz (73.8 kg), SpO2 100 %.    HEENT: Thick white coat over the tongue, mild thrush of the right buccal mucosa Resp: Decreased breath sounds at the bases, no respiratory distress Cardio: Regular rate and rhythm GI: No hepatosplenomegaly Vascular: No leg edema Musculoskeletal: No tenderness of the left chest wall.  No pain with motion at the left or right hip.  No tenderness at the left trochanter.  There is slight soft fullness at the left upper anterior thigh with mild tenderness    Lab Results:  Lab Results  Component Value Date   WBC 14.3 (H) 08/22/2020   HGB 12.7 (L) 08/22/2020   HCT 39.2 08/22/2020   MCV 81.8 08/22/2020   PLT 484 (H) 08/22/2020   NEUTROABS 7.0 08/10/2020    CMP  Lab Results  Component Value Date   NA 134 (L) 08/10/2020   K 4.5 08/10/2020   CL 101 08/10/2020   CO2 25 08/10/2020   GLUCOSE 85 08/10/2020   BUN 10 08/10/2020   CREATININE 0.81 08/10/2020   CALCIUM 8.5 (L) 08/10/2020   PROT 9.9 (H) 07/29/2020   ALBUMIN 2.3 (L) 07/29/2020   AST 66 (H) 07/29/2020   ALT 120 (H) 07/29/2020   ALKPHOS 465 (H) 07/29/2020   BILITOT 0.8  07/29/2020   GFRNONAA >60 08/10/2020   GFRAA >60 07/29/2020    Medications: I have reviewed the patient's current medications.   Assessment/Plan: 1. Hepatocellular carcinoma ? MRI abdomen 07/17/2020-large infiltrative mass in the left hepatic lobe characterized asLi-Rads Mbased on lack of a well-defined lesions, meeting LI-RADS 5criteria, small right hepatic lesions, chest wall and nodal metastatic disease, changes of cirrhosis with portal hypertension and left portal vein tumor thrombus ? CTs 08/05/2020-chest wall mass involving the posterior lateral left seventh rib with bone destruction and soft tissue expansion measuring 5.6 x 3.2 cm; several noncalcified right lung nodules; enlarged retrocrural lymph nodes along the esophagus below the carina; infiltrative mass occupying a large portion of the left hepatic lobe measuring 11.6 x 8.1 cm; liver has a lobular contour consistent with cirrhosis; enlarged periportal lymph node; gastrohepatic ligament nodes measuring 2.8 cm; celiac trunk lymph node measuring 17 mm ? 08/07/2020 biopsy left chest wall mass-poorly differentiated carcinoma, the immunophenotype indicates a carcinoma but fails to further define a potential origin ? Liver biopsy 08/22/2020-poorly differentiated carcinoma with marked inflammatory response, tumor cells positive for CK AE1/AE3 and CK 8/18; negative for CK7, CK20, arginase, glypican-3 and HepPar 1 ? Bone scan 08/23/2020-uptake at lytic metastases of the lateral left seventh rib and of the left ischium corresponding to lytic lesions on  most recent CT.  Uptake at upper to mid cervical spine may be degenerative but metastatic disease is not excluded 2. Cirrhosis 3. History of hepatitis C-treated 4. Remote history of alcohol use 5. Hypertension 6. Hyperlipidemia 7. Anorexia/weight loss 8. Pain secondary to metastatic hepatocellular carcinoma    Disposition: Jesse Wilcox has advanced hepatocellular carcinoma.  His clinical  status continues to decline.  He has persistent pain at the left chest wall and now has pain at the left upper leg.  The etiology of the leg pain is unclear.  I have a low clinical suspicion for a fracture or deep vein thrombosis.  I discussed the case with Dr. Lyman Speller.  He will adjust the methadone dose and arrange for more frequent nursing visits.  I refilled the oxycodone today.  He will complete a course of Diflucan for oral candidiasis.  Jesse Wilcox would like to continue follow-up at the Cancer center.  He will return for an office visit in 3 weeks.  Betsy Coder, MD  10/22/2020  12:41 PM

## 2020-11-05 ENCOUNTER — Other Ambulatory Visit: Payer: Self-pay | Admitting: Oncology

## 2020-11-05 ENCOUNTER — Telehealth: Payer: Self-pay | Admitting: *Deleted

## 2020-11-05 DIAGNOSIS — C220 Liver cell carcinoma: Secondary | ICD-10-CM

## 2020-11-05 MED ORDER — OXYCODONE HCL 10 MG PO TABS: 10.0000 mg | ORAL_TABLET | ORAL | 0 refills | Status: DC | PRN

## 2020-11-05 NOTE — Telephone Encounter (Signed)
Hospice RN calling for refill on his oxycodone:takes #2 tablets every 4 hours.

## 2020-11-08 ENCOUNTER — Telehealth: Payer: Self-pay | Admitting: *Deleted

## 2020-11-08 MED ORDER — FLUCONAZOLE 100 MG PO TABS
100.0000 mg | ORAL_TABLET | Freq: Every day | ORAL | 0 refills | Status: DC
Start: 2020-11-08 — End: 2020-12-05

## 2020-11-08 NOTE — Telephone Encounter (Addendum)
Message left by [facility], RN that patient has thrush on his tongue again. He says it is making it difficult to swallow and trouble eating. Asking about Diflucan course again. CBG readings have been running in the 150's and he is not on steroids. Per Dr. Benay Spice: OK for fluconazole X 5 days.

## 2020-11-12 ENCOUNTER — Inpatient Hospital Stay: Payer: Medicare Other | Attending: Oncology | Admitting: Oncology

## 2020-11-12 ENCOUNTER — Encounter: Payer: Self-pay | Admitting: Nutrition

## 2020-11-12 ENCOUNTER — Other Ambulatory Visit: Payer: Self-pay

## 2020-11-12 ENCOUNTER — Telehealth: Payer: Self-pay | Admitting: Oncology

## 2020-11-12 VITALS — BP 105/72 | HR 99 | Temp 98.1°F | Resp 20 | Ht 71.0 in | Wt 161.6 lb

## 2020-11-12 DIAGNOSIS — R630 Anorexia: Secondary | ICD-10-CM | POA: Insufficient documentation

## 2020-11-12 DIAGNOSIS — R634 Abnormal weight loss: Secondary | ICD-10-CM | POA: Diagnosis not present

## 2020-11-12 DIAGNOSIS — B370 Candidal stomatitis: Secondary | ICD-10-CM | POA: Diagnosis not present

## 2020-11-12 DIAGNOSIS — I10 Essential (primary) hypertension: Secondary | ICD-10-CM | POA: Insufficient documentation

## 2020-11-12 DIAGNOSIS — E785 Hyperlipidemia, unspecified: Secondary | ICD-10-CM | POA: Diagnosis not present

## 2020-11-12 DIAGNOSIS — C220 Liver cell carcinoma: Secondary | ICD-10-CM | POA: Insufficient documentation

## 2020-11-12 DIAGNOSIS — C7951 Secondary malignant neoplasm of bone: Secondary | ICD-10-CM | POA: Insufficient documentation

## 2020-11-12 DIAGNOSIS — K7460 Unspecified cirrhosis of liver: Secondary | ICD-10-CM | POA: Diagnosis not present

## 2020-11-12 DIAGNOSIS — Z79899 Other long term (current) drug therapy: Secondary | ICD-10-CM | POA: Insufficient documentation

## 2020-11-12 DIAGNOSIS — G893 Neoplasm related pain (acute) (chronic): Secondary | ICD-10-CM | POA: Diagnosis not present

## 2020-11-12 NOTE — Progress Notes (Signed)
Provided 1 complementary case of Ensure plus. 

## 2020-11-12 NOTE — Telephone Encounter (Signed)
Scheduled appointment per 1/11 los. Spoke to patient who is aware of appointment date and time.  °

## 2020-11-12 NOTE — Progress Notes (Signed)
Jesse Wilcox OFFICE PROGRESS NOTE   Diagnosis: Hepatocellular carcinoma  INTERVAL HISTORY:   Jesse Wilcox returns for a scheduled visit.  He is here today with his sister.  He is followed by the home hospice program.  He reports adequate pain control with methadone and oxycodone.  The pain is still chiefly at the left chest wall.  Jesse Wilcox has improved with fluconazole.  He is able to ambulate in the home.  He has a poor appetite.  Objective:  Vital signs in last 24 hours:  Blood pressure 105/72, pulse 99, temperature 98.1 F (36.7 C), temperature source Tympanic, resp. rate 20, height 5\' 11"  (1.803 m), weight 161 lb 9.6 oz (73.3 kg), SpO2 100 %.    HEENT: Mild white coat over the tongue, no buccal thrush or ulcers Resp: Decreased breath sounds at the right posterior base, no respiratory distress Cardio: Regular rate and rhythm GI: No mass, nontender, the liver edge is palpable in the right upper abdomen Vascular: Trace lower leg edema bilaterally   Lab Results:  Lab Results  Component Value Date   WBC 14.3 (H) 08/22/2020   HGB 12.7 (L) 08/22/2020   HCT 39.2 08/22/2020   MCV 81.8 08/22/2020   PLT 484 (H) 08/22/2020   NEUTROABS 7.0 08/10/2020    CMP  Lab Results  Component Value Date   NA 134 (L) 08/10/2020   K 4.5 08/10/2020   CL 101 08/10/2020   CO2 25 08/10/2020   GLUCOSE 85 08/10/2020   BUN 10 08/10/2020   CREATININE 0.81 08/10/2020   CALCIUM 8.5 (L) 08/10/2020   PROT 9.9 (H) 07/29/2020   ALBUMIN 2.3 (L) 07/29/2020   AST 66 (H) 07/29/2020   ALT 120 (H) 07/29/2020   ALKPHOS 465 (H) 07/29/2020   BILITOT 0.8 07/29/2020   GFRNONAA >60 08/10/2020   GFRAA >60 07/29/2020     Medications: I have reviewed the patient's current medications.   Assessment/Plan: 1. Hepatocellular carcinoma ? MRI abdomen 07/17/2020-large infiltrative mass in the left hepatic lobe characterized asLi-Rads Mbased on lack of a well-defined lesions, meeting LI-RADS  5criteria, small right hepatic lesions, chest wall and nodal metastatic disease, changes of cirrhosis with portal hypertension and left portal vein tumor thrombus ? CTs 08/05/2020-chest wall mass involving the posterior lateral left seventh rib with bone destruction and soft tissue expansion measuring 5.6 x 3.2 cm; several noncalcified right lung nodules; enlarged retrocrural lymph nodes along the esophagus below the carina; infiltrative mass occupying a large portion of the left hepatic lobe measuring 11.6 x 8.1 cm; liver has a lobular contour consistent with cirrhosis; enlarged periportal lymph node; gastrohepatic ligament nodes measuring 2.8 cm; celiac trunk lymph node measuring 17 mm ? 08/07/2020 biopsy left chest wall mass-poorly differentiated carcinoma, the immunophenotype indicates a carcinoma but fails to further define a potential origin ? Liver biopsy 08/22/2020-poorly differentiated carcinoma with marked inflammatory response, tumor cells positive for CK AE1/AE3 and CK 8/18; negative for CK7, CK20, arginase, glypican-3 and HepPar 1 ? Bone scan 08/23/2020-uptake at lytic metastases of the lateral left seventh rib and of the left ischium corresponding to lytic lesions on most recent CT.  Uptake at upper to mid cervical spine may be degenerative but metastatic disease is not excluded 2. Cirrhosis 3. History of hepatitis C-treated 4. Remote history of alcohol use 5. Hypertension 6. Hyperlipidemia 7. Anorexia/weight loss 8. Pain secondary to metastatic hepatocellular carcinoma    Disposition: [name] appears stable.  He has adequate pain control with methadone and oxycodone.  He will continue the current narcotic regimen.  He is completing a course of Diflucan for oral candidiasis. He would like to continue follow-up at the Cancer center.  [name] will return for an office visit in 3-4 weeks.  He continues follow-up with the home hospice program  Betsy Coder, MD  11/12/2020   1:46 PM

## 2020-11-13 ENCOUNTER — Other Ambulatory Visit: Payer: Self-pay | Admitting: Oncology

## 2020-11-13 ENCOUNTER — Telehealth: Payer: Self-pay | Admitting: *Deleted

## 2020-11-13 ENCOUNTER — Other Ambulatory Visit: Payer: Self-pay | Admitting: Nurse Practitioner

## 2020-11-13 DIAGNOSIS — C220 Liver cell carcinoma: Secondary | ICD-10-CM

## 2020-11-13 MED ORDER — OXYCODONE HCL 10 MG PO TABS: 10.0000 mg | ORAL_TABLET | ORAL | 0 refills | Status: DC | PRN

## 2020-11-13 NOTE — Telephone Encounter (Signed)
Notified sister, [name] that oxycodone refill was sent and called hospice RN to discuss w/Dr. Lyman Speller if he needs higher methadone dose since he is taking so much oxycodone.

## 2020-11-13 NOTE — Telephone Encounter (Addendum)
Sister, [name] forgot to ask for his refill on oxycodone yesterday at office visit. Will be out tomorrow. Takes 20 mg every 4 hours prn pain[facility] [address] Please call sister when ready. Confirmed w/nurse that he is on Methadone 10 mg tid as well.

## 2020-12-03 ENCOUNTER — Telehealth: Payer: Self-pay

## 2020-12-03 MED ORDER — LORAZEPAM 0.5 MG PO TABS: 0.5000 mg | ORAL_TABLET | Freq: Every day | ORAL | 1 refills | Status: AC

## 2020-12-03 NOTE — Addendum Note (Signed)
Addended by: Tania Ade on: 12/03/2020 03:53 PM   Modules accepted: Orders

## 2020-12-03 NOTE — Telephone Encounter (Signed)
RN with [facility] called in to report pt was having issues with insomnia. RN reported pt was taking 20 mg methadone po tid plus oxycodone  20 mg po q 4h to control pain. RN also reported pt's sister was asking about an MRI and they explained that it would be an out of pocket expense since pt was with Hospice. RN wanted Dr Benay Spice to be aware. Will report to MD.

## 2020-12-03 NOTE — Telephone Encounter (Signed)
Per Dr. Benay Spice :NO MRI. Refilled lorazepam.

## 2020-12-05 ENCOUNTER — Telehealth: Payer: Self-pay | Admitting: Oncology

## 2020-12-05 ENCOUNTER — Inpatient Hospital Stay: Payer: Medicare Other | Attending: Oncology | Admitting: Oncology

## 2020-12-05 ENCOUNTER — Other Ambulatory Visit: Payer: Self-pay | Admitting: *Deleted

## 2020-12-05 ENCOUNTER — Other Ambulatory Visit: Payer: Self-pay

## 2020-12-05 DIAGNOSIS — G893 Neoplasm related pain (acute) (chronic): Secondary | ICD-10-CM | POA: Diagnosis not present

## 2020-12-05 DIAGNOSIS — K7460 Unspecified cirrhosis of liver: Secondary | ICD-10-CM | POA: Insufficient documentation

## 2020-12-05 DIAGNOSIS — C220 Liver cell carcinoma: Secondary | ICD-10-CM | POA: Diagnosis present

## 2020-12-05 DIAGNOSIS — R634 Abnormal weight loss: Secondary | ICD-10-CM | POA: Insufficient documentation

## 2020-12-05 DIAGNOSIS — E785 Hyperlipidemia, unspecified: Secondary | ICD-10-CM | POA: Diagnosis not present

## 2020-12-05 DIAGNOSIS — R630 Anorexia: Secondary | ICD-10-CM | POA: Diagnosis not present

## 2020-12-05 DIAGNOSIS — I10 Essential (primary) hypertension: Secondary | ICD-10-CM | POA: Insufficient documentation

## 2020-12-05 MED ORDER — OXYCODONE HCL 10 MG PO TABS: 10.0000 mg | ORAL_TABLET | ORAL | 0 refills | Status: DC | PRN

## 2020-12-05 MED ORDER — FLUCONAZOLE 100 MG PO TABS: 100.0000 mg | ORAL_TABLET | Freq: Every day | ORAL | 0 refills | Status: AC

## 2020-12-05 MED ORDER — CLOTRIMAZOLE 10 MG MT TROC: 10.0000 mg | Freq: Three times a day (TID) | OROMUCOSAL | 0 refills | Status: AC

## 2020-12-05 MED ORDER — FLUCONAZOLE 100 MG PO TABS: 100.0000 mg | ORAL_TABLET | Freq: Every day | ORAL | 0 refills | Status: DC

## 2020-12-05 NOTE — Progress Notes (Signed)
[facility] OFFICE PROGRESS NOTE   Diagnosis: Hepatocellular carcinoma  INTERVAL HISTORY:   Jesse Wilcox returns for scheduled visit.  He is here today with his sister.  He reports adequate pain control with the current narcotic regimen.  He has developed increased pain at the right lower chest/upper abdomen.  He reports increased leg and foot edema.  Objective:  Vital signs in last 24 hours:  Blood pressure (!) 106/53, pulse (!) 112, temperature 97.8 F (36.6 C), temperature source Tympanic, resp. rate 15, height 5\' 11"  (1.803 m), weight 159 lb 9.6 oz (72.4 kg), SpO2 98 %.    HEENT: Thrush over the tongue and buccal mucosa Resp: Lungs clear bilaterally, no respiratory distress Cardio: Regular rate and rhythm GI: Mild tenderness in the right subcostal region.  Fullness in the right upper quadrant. Vascular: Pitting edema to lower leg and feet bilaterally Musculoskeletal: No mass at the right chest wall.  No tenderness over the right ribs, mild tenderness in the right subcostal region  Lab Results:  Lab Results  Component Value Date   WBC 14.3 (H) 08/22/2020   HGB 12.7 (L) 08/22/2020   HCT 39.2 08/22/2020   MCV 81.8 08/22/2020   PLT 484 (H) 08/22/2020   NEUTROABS 7.0 08/10/2020    CMP  Lab Results  Component Value Date   NA 134 (L) 08/10/2020   K 4.5 08/10/2020   CL 101 08/10/2020   CO2 25 08/10/2020   GLUCOSE 85 08/10/2020   BUN 10 08/10/2020   CREATININE 0.81 08/10/2020   CALCIUM 8.5 (L) 08/10/2020   PROT 9.9 (H) 07/29/2020   ALBUMIN 2.3 (L) 07/29/2020   AST 66 (H) 07/29/2020   ALT 120 (H) 07/29/2020   ALKPHOS 465 (H) 07/29/2020   BILITOT 0.8 07/29/2020   GFRNONAA >60 08/10/2020   GFRAA >60 07/29/2020    No results found for: CEA1  Lab Results  Component Value Date   INR 1.3 (H) 08/22/2020    Imaging:  No results found.  Medications: I have reviewed the patient's current medications.   Assessment/Plan: 1. Hepatocellular  carcinoma ? MRI abdomen 07/17/2020-large infiltrative mass in the left hepatic lobe characterized asLi-Rads Mbased on lack of a well-defined lesions, meeting LI-RADS 5criteria, small right hepatic lesions, chest wall and nodal metastatic disease, changes of cirrhosis with portal hypertension and left portal vein tumor thrombus ? CTs 08/05/2020-chest wall mass involving the posterior lateral left seventh rib with bone destruction and soft tissue expansion measuring 5.6 x 3.2 cm; several noncalcified right lung nodules; enlarged retrocrural lymph nodes along the esophagus below the carina; infiltrative mass occupying a large portion of the left hepatic lobe measuring 11.6 x 8.1 cm; liver has a lobular contour consistent with cirrhosis; enlarged periportal lymph node; gastrohepatic ligament nodes measuring 2.8 cm; celiac trunk lymph node measuring 17 mm ? 08/07/2020 biopsy left chest wall mass-poorly differentiated carcinoma, the immunophenotype indicates a carcinoma but fails to further define a potential origin ? Liver biopsy 08/22/2020-poorly differentiated carcinoma with marked inflammatory response, tumor cells positive for CK AE1/AE3 and CK 8/18; negative for CK7, CK20, arginase, glypican-3 and HepPar 1 ? Bone scan 08/23/2020-uptake at lytic metastases of the lateral left seventh rib and of the left ischium corresponding to lytic lesions on most recent CT.  Uptake at upper to mid cervical spine may be degenerative but metastatic disease is not excluded 2. Cirrhosis 3. History of hepatitis C-treated 4. Remote history of alcohol use 5. Hypertension 6. Hyperlipidemia 7. Anorexia/weight loss 8. Pain  secondary to metastatic hepatocellular carcinoma    Disposition: Mr. Kille has metastatic hepatocellular carcinoma.  He appears to have adequate pain control with the methadone/oxycodone regimen.  He has oral candidiasis.  He will complete another course of Diflucan and then begin Mycelex  troches. Mr. Marinaro would like to continue follow-up at the Cancer center.  He will return for an office visit in 3 weeks.  He continues follow-up with the home hospice team.  I refilled his prescription for oxycodone.  Betsy Coder, MD  12/05/2020  9:27 AM

## 2020-12-05 NOTE — Telephone Encounter (Signed)
Scheduled appointment per 2/3 los. Spoke to patient who is aware of appointment date and time. Gave patient calendar print out.

## 2020-12-16 ENCOUNTER — Other Ambulatory Visit: Payer: Self-pay | Admitting: Oncology

## 2020-12-16 ENCOUNTER — Telehealth: Payer: Self-pay | Admitting: *Deleted

## 2020-12-16 DIAGNOSIS — C220 Liver cell carcinoma: Secondary | ICD-10-CM

## 2020-12-16 MED ORDER — OXYCODONE HCL 10 MG PO TABS: 10.0000 mg | ORAL_TABLET | ORAL | 0 refills | Status: DC | PRN

## 2020-12-16 NOTE — Telephone Encounter (Signed)
Needs refill on oxycodone: still takes #2 every 4 hours. Will be out tomorrow.

## 2020-12-26 ENCOUNTER — Other Ambulatory Visit: Payer: Self-pay | Admitting: Nurse Practitioner

## 2020-12-26 ENCOUNTER — Telehealth: Payer: Self-pay | Admitting: *Deleted

## 2020-12-26 DIAGNOSIS — C220 Liver cell carcinoma: Secondary | ICD-10-CM

## 2020-12-26 MED ORDER — OXYCODONE HCL 10 MG PO TABS: 10.0000 mg | ORAL_TABLET | ORAL | 0 refills | Status: AC | PRN

## 2020-12-26 NOTE — Telephone Encounter (Signed)
Hospice nurse left message that patient is taking rapid decline and may die over weekend or early next week. Daughter not able to get him to office tomorrow for his appointment at 0830 and is requesting a virtual appointment instead. 2nd call from nurse at 1600 reporting he is crying in pain and takes the oxycodone #2 tabs every 4 hours ATC and needs a refill. They are not ready to transition to morphine.

## 2020-12-26 NOTE — Telephone Encounter (Signed)
Called Jesse Wilcox back and informed her that oxycodone was refilled and he can take #3 every 4 hours as needed. Does not have Mychart, so will need to do a telephone visit tomorrow instead. She prefers the telephone visit.

## 2020-12-27 ENCOUNTER — Inpatient Hospital Stay (HOSPITAL_BASED_OUTPATIENT_CLINIC_OR_DEPARTMENT_OTHER): Payer: Medicare Other | Admitting: Oncology

## 2020-12-27 ENCOUNTER — Telehealth: Payer: Self-pay | Admitting: *Deleted

## 2020-12-27 DIAGNOSIS — R634 Abnormal weight loss: Secondary | ICD-10-CM

## 2020-12-27 DIAGNOSIS — K7460 Unspecified cirrhosis of liver: Secondary | ICD-10-CM | POA: Diagnosis not present

## 2020-12-27 DIAGNOSIS — R451 Restlessness and agitation: Secondary | ICD-10-CM

## 2020-12-27 DIAGNOSIS — R630 Anorexia: Secondary | ICD-10-CM

## 2020-12-27 DIAGNOSIS — C220 Liver cell carcinoma: Secondary | ICD-10-CM | POA: Diagnosis not present

## 2020-12-27 DIAGNOSIS — E785 Hyperlipidemia, unspecified: Secondary | ICD-10-CM

## 2020-12-27 DIAGNOSIS — G893 Neoplasm related pain (acute) (chronic): Secondary | ICD-10-CM | POA: Diagnosis not present

## 2020-12-27 DIAGNOSIS — I10 Essential (primary) hypertension: Secondary | ICD-10-CM

## 2020-12-27 NOTE — Progress Notes (Signed)
[facility] OFFICE VISIT PROGRESS NOTE  I connected with Yetta Numbers on 12/27/20 at  8:30 AM EST by telephone and verified that I am speaking with the correct person using two identifiers.   I discussed the limitations, risks, security and privacy concerns of performing an evaluation and management service by telemedicine and the availability of in-person appointments. I also discussed with the patient that there may be a patient responsible charge related to this service. The patient expressed understanding and agreed to proceed.  Other persons participating in the visit and their role in the encounter: Sister  Patient's location: Home Provider's location: Office    Diagnosis: Hepatocellular carcinoma  INTERVAL HISTORY:   Mr. Fitzpatrick is seen for a telehealth visit per the request of his sister.  He is enrolled in home hospice care.  His sister reports Mr. Elahi clinical status has declined over the past several days.  He has increased pain.  He had increased leg swelling that improved with leg elevation.  He is currently taking methadone (20 mg every 8 hours), and oxycodone (30 mg every 4 hours as needed) for pain.  He continues to have pain.  His sister reports Mr Schaberg is very agitated.  Lorazepam did not help.  He has pain and tenderness in the legs.  The legs are warm.  Mr. Schauf has difficulty taking pills.  His sister is crushing the oxycodone tablets.   Lab Results:  Lab Results  Component Value Date   WBC 14.3 (H) 08/22/2020   HGB 12.7 (L) 08/22/2020   HCT 39.2 08/22/2020   MCV 81.8 08/22/2020   PLT 484 (H) 08/22/2020   NEUTROABS 7.0 08/10/2020    Medications: I have reviewed the patient's current medications.  Assessment/Plan: 1. Hepatocellular carcinoma ? MRI abdomen 07/17/2020-large infiltrative mass in the left hepatic lobe characterized asLi-Rads Mbased on lack of a well-defined lesions, meeting  LI-RADS 5criteria, small right hepatic lesions, chest wall and nodal metastatic disease, changes of cirrhosis with portal hypertension and left portal vein tumor thrombus ? CTs 08/05/2020-chest wall mass involving the posterior lateral left seventh rib with bone destruction and soft tissue expansion measuring 5.6 x 3.2 cm; several noncalcified right lung nodules; enlarged retrocrural lymph nodes along the esophagus below the carina; infiltrative mass occupying a large portion of the left hepatic lobe measuring 11.6 x 8.1 cm; liver has a lobular contour consistent with cirrhosis; enlarged periportal lymph node; gastrohepatic ligament nodes measuring 2.8 cm; celiac trunk lymph node measuring 17 mm ? 08/07/2020 biopsy left chest wall mass-poorly differentiated carcinoma, the immunophenotype indicates a carcinoma but fails to further define a potential origin ? Liver biopsy 08/22/2020-poorly differentiated carcinoma with marked inflammatory response, tumor cells positive for CK AE1/AE3 and CK 8/18; negative for CK7, CK20, arginase,glypican-3 andHepPar 1 ? Bone scan 08/23/2020-uptake at lytic metastases of the lateral left seventh rib and of the left ischium corresponding to lytic lesions on most recent CT. Uptake at upper to mid cervical spine may be degenerative but metastatic disease is not excluded 2. Cirrhosis 3. History of hepatitis C-treated 4. Remote history of alcohol use 5. Hypertension 6. Hyperlipidemia 7. Anorexia/weight loss 8. Pain secondary to metastatic hepatocellular carcinoma    Disposition: Mr. Bail has metastatic hepatocellular carcinoma.  His overall clinical status has declined significantly over the past week.  Has increased pain and difficulty taking pills.  He has altered mental status.  I discussed the poor prognosis with his sister.  She understands  his lifespan may be limited to days or a week.  The hospice nurse is scheduled to visit today.  Mr. Kaczmarczyk sister will  asked the hospice nurse to contact us so that we can adjust the narcotic regimen.  He will likely be a candidate for liquid morphine.  Mr. Arnette sister would like to schedule a telehealth visit for next week.  We will schedule a phone visit for 1 week.   I discussed the assessment and treatment plan with the patient. The patient was provided an opportunity to ask questions and all were answered. The patient agreed with the plan and demonstrated an understanding of the instructions.   The patient was advised to call back or seek an in-person evaluation if the symptoms worsen or if the condition fails to improve as anticipated.  I provided 20 minutes of chart review, telephone, and documentation during this encounter, and > 50% was spent counseling as documented under my assessment & plan.  Betsy Coder ANP/GNP-BC   12/27/2020 8:52 AM   Addendum:  We were contacted by the hospice RN.  Mr. Buening is having difficulty taking pills.  Roxanol has been prescribed by the hospice medical director.  He will use Roxanol if he is unable to take methadone and oxycodone.

## 2020-12-27 NOTE — Telephone Encounter (Signed)
RN in home now with patient and his sister, [name]. Dr. Lyman Speller had sent in script for morphine liquid 20mg /ml for 1 ml every 4 hours prn. She is hesitant to start since this is a new medication. Dr. Benay Spice had sent in script for increased dose of oxycodone yesterday and was more comfortable with this even though she has to crush them due to trouble swallowing. This RN informed her that it will easier for patient and should provide good pain relief to take the liquid morphine, but she needs to decide--can't take both. She is worried 1 cc is too much and wants to start 1/2 cc dose of morphine and will then give another 1/2 cc if that does not help. She will put the oxycodone to the side for now. Hospice RN will discuss increase of methadone with Dr. Lyman Speller. Dr. Benay Spice is OK w/increase in this and prefers Dr. Lyman Speller to dose the methadone.

## 2020-12-27 NOTE — Telephone Encounter (Signed)
Return call from [place], [place] from Hospice: Sister is not confident in Hospice managing his pain regimen and she has now decided not to use the morphine, saying she is "afraid" of it. [name] wants Dr. Benay Spice to dose all his medications. Nurse said when she left he was very comfortable--she would lift his arm or leg and it would just drop limp and he made no noise. Respirations are ~ 5/minute with periods of apnea. Most likely will die over the weekend. Sister has agreed to allow an nurse to check on them this weekend. Per Dr. Benay Spice : Morphine dose and Oxycodone dose is equal. Stop oxycodone and use MS 20 mg every 2 hours prn. Stop the methadone once he can't swallow safely. Please keep weekend visit w/Hospice. Called sister back and left her VM with this information and made her aware this is what Dr. Benay Spice thinks she needs to do.

## 2020-12-30 ENCOUNTER — Telehealth: Payer: Self-pay | Admitting: Oncology

## 2020-12-30 NOTE — Telephone Encounter (Signed)
Scheduled appointment per 2/25 los. Spoke to patient's sister who is aware of appointment date and time. She is aware appointment is a phone visit as well.

## 2021-01-03 ENCOUNTER — Inpatient Hospital Stay: Payer: Medicare Other | Attending: Oncology | Admitting: Oncology

## 2021-01-03 ENCOUNTER — Ambulatory Visit: Payer: Medicare Other | Admitting: Podiatry

## 2021-01-03 DIAGNOSIS — C220 Liver cell carcinoma: Secondary | ICD-10-CM | POA: Diagnosis not present

## 2021-01-03 NOTE — Progress Notes (Signed)
[facility] OFFICE VISIT PROGRESS NOTE  I connected with Jesse Wilcox on 01/03/21 at  1:45 PM EST by telephone and verified that I am speaking with the correct person using two identifiers.   I discussed the limitations, risks, security and privacy concerns of performing an evaluation and management service by telemedicine and the availability of in-person appointments. I also discussed with the patient that there may be a patient responsible charge related to this service. The patient expressed understanding and agreed to proceed.  Other persons participating in the visit and their role in the encounter: Jesse  Patient's location: Home Provider's location: Office   Diagnosis: Hepatocellular carcinoma  INTERVAL HISTORY:   A telephone visit was requested by Jesse. Wilcox sister.  She is caring for him in the home.  He continues close follow-up with the [facility] hospice program.  He has declined further over the past week.  He is no longer speaking or eating/drinking.  His sister is turning him in the bed and reports he is developing skin breakdown.  He continues to receive methadone (20 mg every 8 hours), 1 mg of lorazepam every 4 hours, and 20 mg of liquid morphine every 2 hours.  She reports agitation has improved with this regimen.  His respirations are irregular.    Medications: I have reviewed the patient's current medications.  Assessment/Plan: 1. Hepatocellular carcinoma ? MRI abdomen 07/17/2020-large infiltrative mass in the left hepatic lobe characterized asLi-Rads Mbased on lack of a well-defined lesions, meeting LI-RADS 5criteria, small right hepatic lesions, chest wall and nodal metastatic disease, changes of cirrhosis with portal hypertension and left portal vein tumor thrombus ? CTs 08/05/2020-chest wall mass involving the posterior lateral left seventh rib with bone destruction and soft tissue expansion measuring 5.6 x  3.2 cm; several noncalcified right lung nodules; enlarged retrocrural lymph nodes along the esophagus below the carina; infiltrative mass occupying a large portion of the left hepatic lobe measuring 11.6 x 8.1 cm; liver has a lobular contour consistent with cirrhosis; enlarged periportal lymph node; gastrohepatic ligament nodes measuring 2.8 cm; celiac trunk lymph node measuring 17 mm ? 08/07/2020 biopsy left chest wall mass-poorly differentiated carcinoma, the immunophenotype indicates a carcinoma but fails to further define a potential origin ? Liver biopsy 08/22/2020-poorly differentiated carcinoma with marked inflammatory response, tumor cells positive for CK AE1/AE3 and CK 8/18; negative for CK7, CK20, arginase,glypican-3 andHepPar 1 ? Bone scan 08/23/2020-uptake at lytic metastases of the lateral left seventh rib and of the left ischium corresponding to lytic lesions on most recent CT. Uptake at upper to mid cervical spine may be degenerative but metastatic disease is not excluded 2. Cirrhosis 3. History of hepatitis C-treated 4. Remote history of alcohol use 5. Hypertension 6. Hyperlipidemia 7. Anorexia/weight loss 8. Pain secondary to metastatic hepatocellular carcinoma     Disposition: Jesse Wilcox has advanced stage hepatocellular carcinoma.  He is currently enrolled in the [facility] home hospice program.  He is now bedbound and appears to be entering the active dying process.  I discussed his current status and prognosis with his sister.  She understands his lifespan will likely be measured in days.  She appears to have good comprehension of his prognosis and the current issues.  He is followed closely by the hospice team.  I asked her to call us as needed.  We did not schedule a follow-up appointment.   I discussed the assessment and treatment plan with the patient. The patient was provided  an opportunity to ask questions and all were answered. The patient agreed with the plan  and demonstrated an understanding of the instructions.   The patient was advised to call back or seek an in-person evaluation if the symptoms worsen or if the condition fails to improve as anticipated.  I provided 10 minutes of telephone, chart review, and documentation time during this encounter, and > 50% was spent counseling as documented under my assessment & plan.  Betsy Coder ANP/GNP-BC   01/03/2021 1:50 PM

## 2021-01-31 DEATH — deceased

## 2021-02-03 IMAGING — MR MR ABDOMEN WO/W CM
9 of 16 series · 20 of 48 positions shown · IV contrast (gadavist)
Comparison: Abdominal sonogram of 07/03/2020

CLINICAL DATA: Liver lesion, abnormality discovered on recent
ultrasound

EXAM:
MRI ABDOMEN WITHOUT AND WITH CONTRAST
TECHNIQUE: Multiplanar multisequence MR imaging of the abdomen was performed
both before and after the administration of intravenous contrast.
CONTRAST:  8mL GADAVIST GADOBUTROL 1 MMOL/ML IV SOLN

[Series 2: cor ssfse nav · coronal · 6.0mm · 0.78mm/px · 1 of 42 slices shown]
[im 1/42]
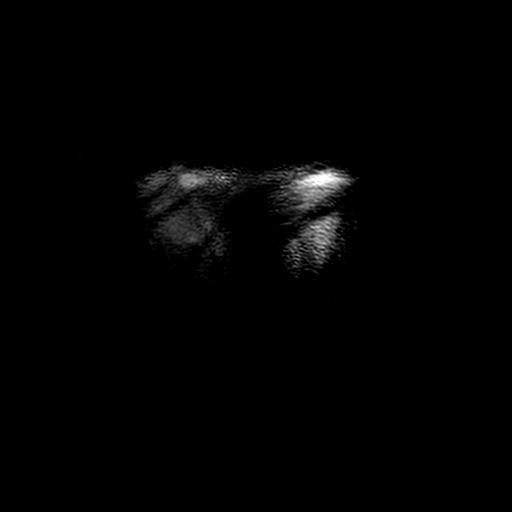

[Series 3: ax ssfse nav · axial · 6.0mm · 0.74mm/px · 1 of 45 slices shown]
[im 1/45]
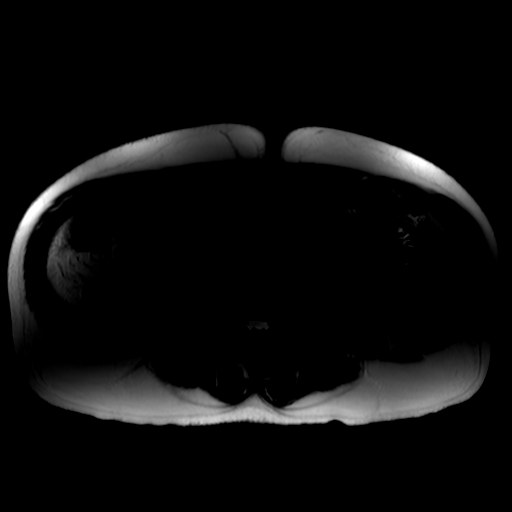

[Series 4: T2 fat-sat · axial · 6.0mm · 0.74mm/px · 1 of 41 slices shown]
[im 1/41]
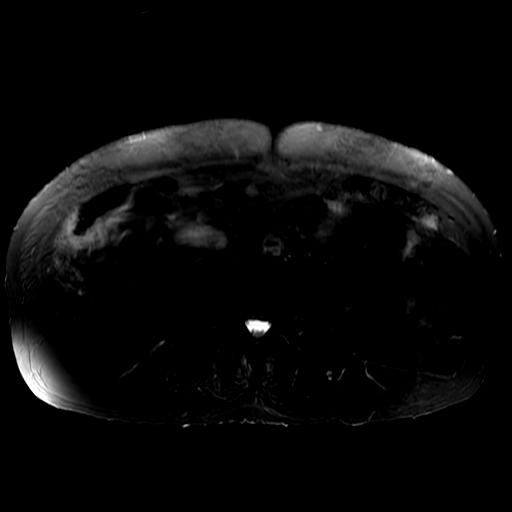

[Series 5: DWI b500 · axial · 6.0mm · 1.48mm/px · z∈[-86,+177]mm · 2 of 82 slices shown]
[im 1/82]
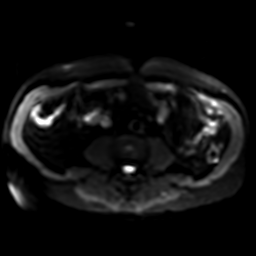
[im 82/82]
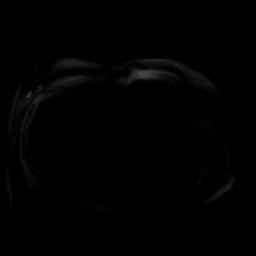

[Series 550: ADC · axial · 6.0mm · 1.48mm/px · 1 of 41 slices shown]
[im 1/41]
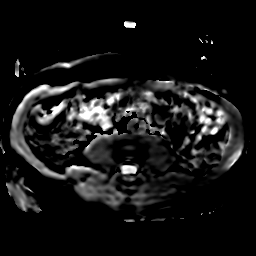

[Series 700: T1 dynamic post-contrast · axial · non-contrast · 4.0mm · 0.86mm/px · z∈[-96,+181]mm · 4 of 140 slices shown (1 of 4)]
[im 1/140]
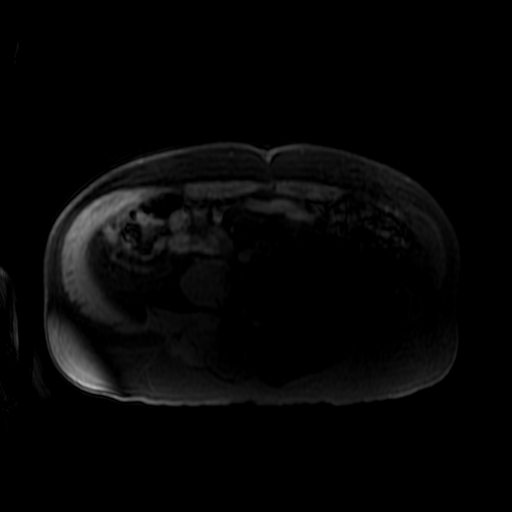
[im 47/140]
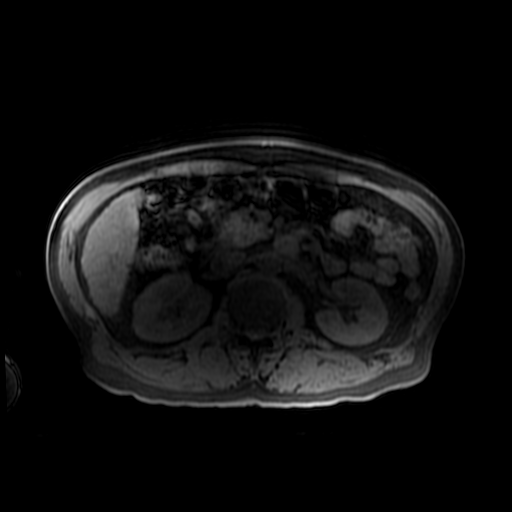
[im 93/140]
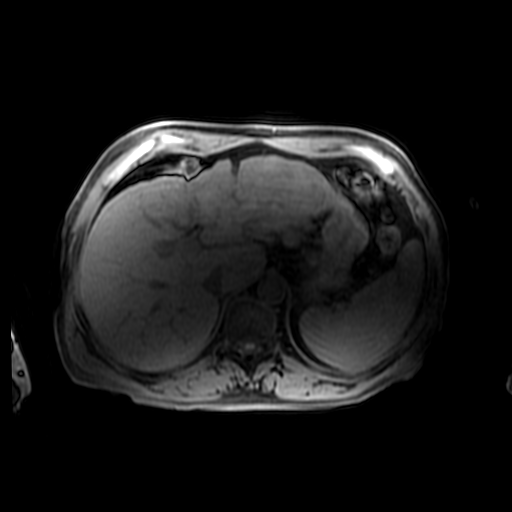
[im 140/140]
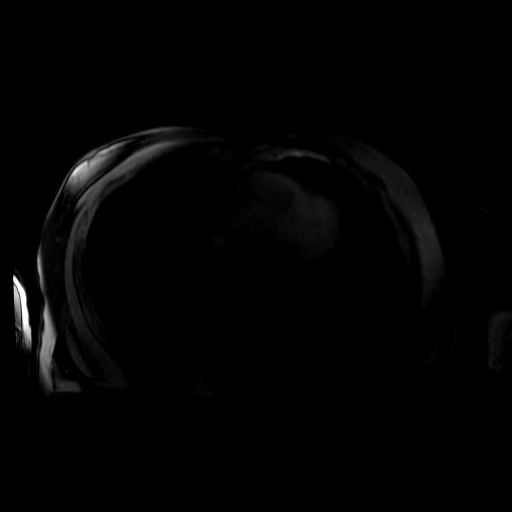

[Series 701: T1 dynamic post-contrast · axial · non-contrast · 4.0mm · 0.86mm/px · z∈[-96,+181]mm · 4 of 140 slices shown (2 of 4)]
[im 1/140]
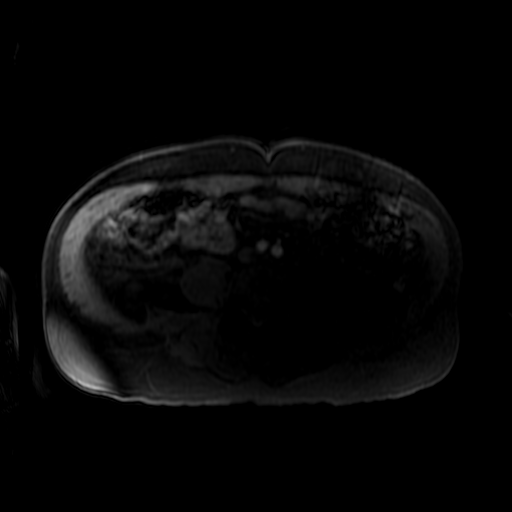
[im 47/140]
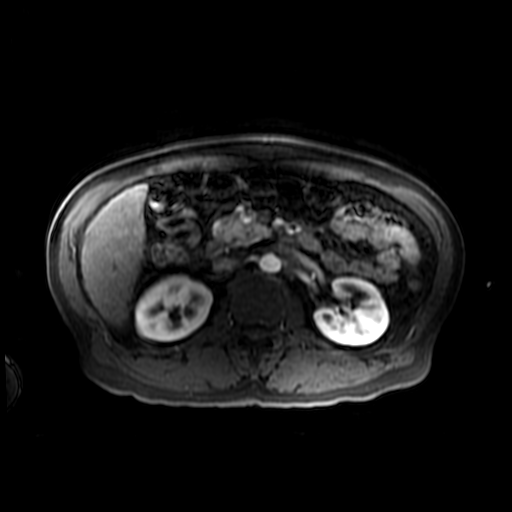
[im 93/140]
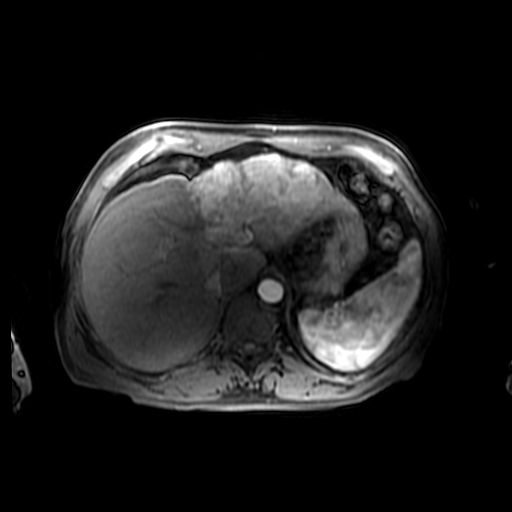
[im 140/140]
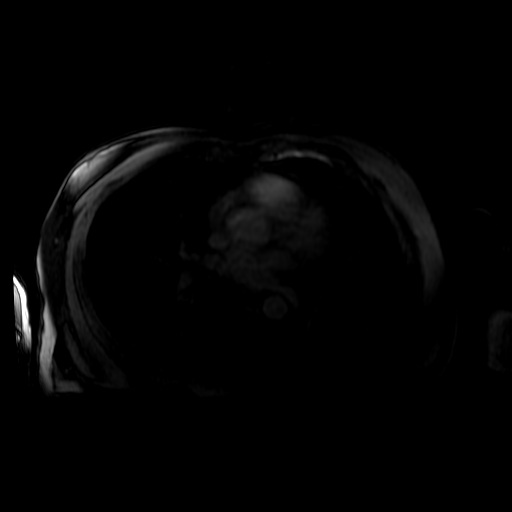

[Series 702: T1 dynamic post-contrast · axial · non-contrast · 4.0mm · 0.86mm/px · z∈[-96,+181]mm · 4 of 140 slices shown (3 of 4)]
[im 1/140]
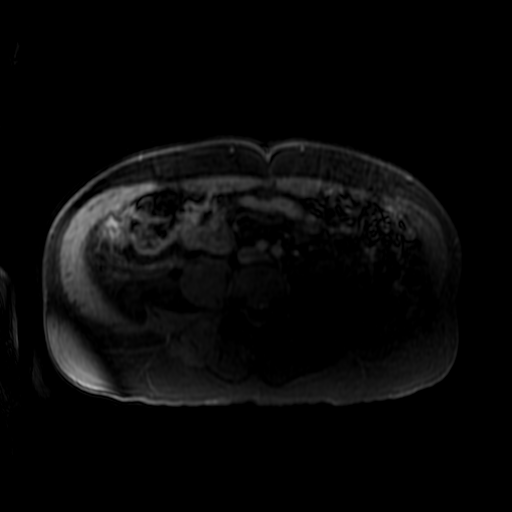
[im 47/140]
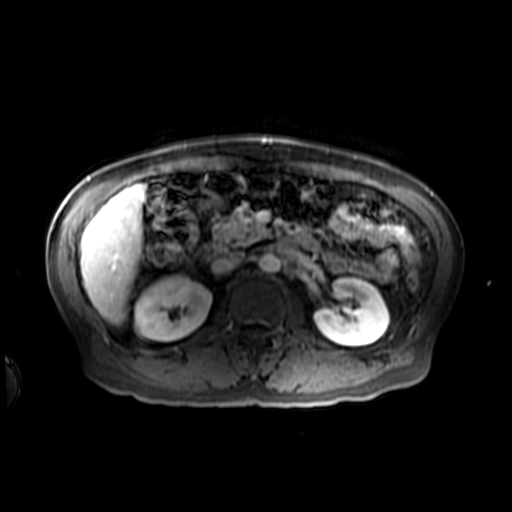
[im 93/140]
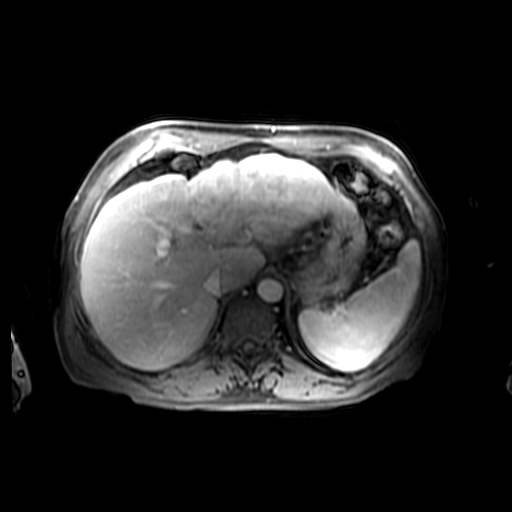
[im 140/140]
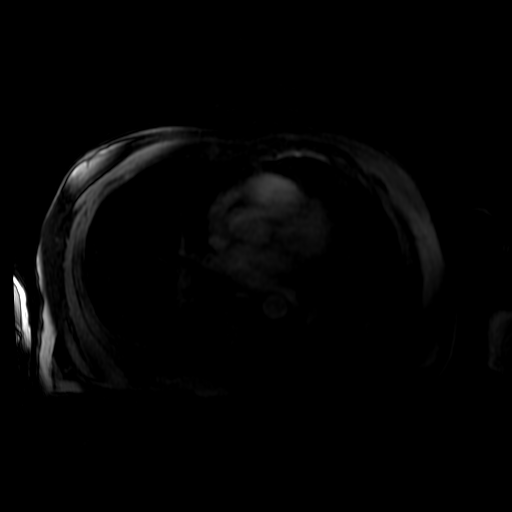

[Series 703: T1 dynamic post-contrast · axial · non-contrast · 4.0mm · 0.86mm/px · z∈[-96,-5]mm · 2 of 140 slices shown (4 of 4)]
[im 1/140]
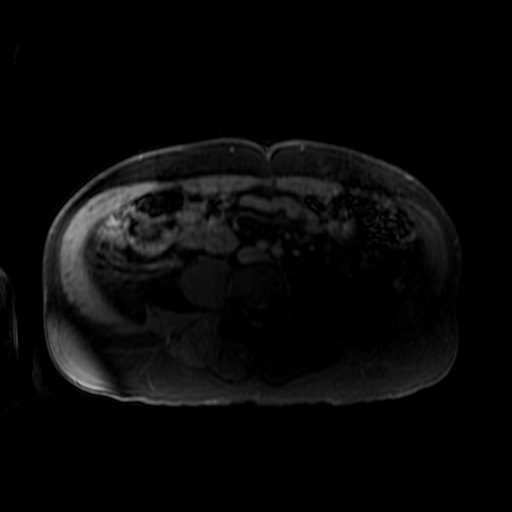
[im 47/140]
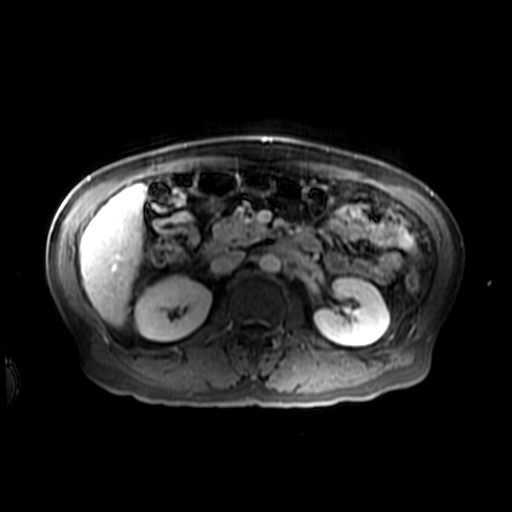

[20 of 48 positions shown; findings below may reference images not displayed]

FINDINGS: Lower chest: Chest wall mass measuring 4.4 x 2.6 cm greatest coronal
dimension best seen on image 14 of series 2, area not well evaluated
on abdominal MRI but a definite finding none the less on today's
study. No sign of pleural effusion. Chest wall abnormality faintly
seen on abdominal axial images as well

Signs of adenopathy in the chest with retrocrural lymph node on
image 8 of series 3 measuring 13 mm short axis. Posterior
mediastinal lymph node on image 4 of series [DATE] cm short axis.

Hepatobiliary: Large, infiltrative mass lesion in the LEFT hepatic
lobe involving nearly the entire lateral segment with signs of
portal venous tumor thrombus extending into the LEFT portal vein.

RIGHT hepatic lobe involvement manifested by a small posterior RIGHT
hepatic lobe lesion (image 56, series [DATE] cm

(Image 67, series [DATE] cm.

Bulky hepatic gastric and upper abdominal lymphadenopathy best seen
on diffusion weighted imaging. Largest a portacaval node measuring
4.7 x 2.9 cm with slightly smaller nodes, the largest of which is
approximately 2.5 cm short axis in the hepatic gastric recess. These
lymph nodes are clearly seen on other sequences as well

Background liver, remaining open "normal" liver with nodular
parenchymal pattern compatible with cirrhosis.

No pericholecystic stranding.  No biliary duct dilation.

Pancreas: Normal intrinsic T1 signal, displaced anteriorly by bulky
upper abdominal lymph nodes.

Spleen: Spleen enlarged measuring 12 cm greatest craniocaudal
extent.

Adrenals/Urinary Tract: Adrenal glands are normal. Symmetric
bilateral renal enhancement. No renal contour abnormality or
hydronephrosis.

Stomach/Bowel: Gastrointestinal tract on limited assessment shows no
acute process. Study not performed for bowel evaluation with limited
evaluation on MRI.

Vascular/Lymphatic: Signs of vascular disease with plaque in the
abdominal aorta.

Nodal disease as outlined above.

Necrotic lymph node in the LEFT retroperitoneum adjacent to LEFT
renal vein (image 90, series [DATE] x 1.4 cm.

Other:  No ascites.  Main portal vein and SMV are patent.

Musculoskeletal: Chest wall mass with destruction as described.
IMPRESSION: 1. Large infiltrative mass in the LEFT hepatic lobe in a patient
with history of hepatitis C showing portal venous invasion, likely
infiltrative hepatocellular carcinoma but characterized as COSIGUA
category M, given the lack of well-defined lesion meeting COSIGUA 5
criteria. Would correlate with alpha fetoprotein. Biopsy of distant
disease may also be helpful for further management.
2. Nodal and chest wall metastatic disease. Pattern of nodal disease
suggests there may be further disease in the chest and chest wall
disease is incompletely imaged on the current study. Cirrhosis with
signs of portal hypertension.
3.
